# Patient Record
Sex: Male | Born: 1980 | Race: White | Hispanic: No | Marital: Single | Smoking: Current every day smoker
Health system: Southern US, Community
[De-identification: ages and names within clinical notes are randomized; demographics above are authoritative.]

## PROBLEM LIST (undated history)

## (undated) DIAGNOSIS — F4310 Post-traumatic stress disorder, unspecified: Secondary | ICD-10-CM

## (undated) DIAGNOSIS — K219 Gastro-esophageal reflux disease without esophagitis: Secondary | ICD-10-CM

## (undated) DIAGNOSIS — F1910 Other psychoactive substance abuse, uncomplicated: Secondary | ICD-10-CM

## (undated) DIAGNOSIS — D649 Anemia, unspecified: Secondary | ICD-10-CM

## (undated) DIAGNOSIS — J189 Pneumonia, unspecified organism: Secondary | ICD-10-CM

## (undated) DIAGNOSIS — T8859XA Other complications of anesthesia, initial encounter: Secondary | ICD-10-CM

## (undated) DIAGNOSIS — F1310 Sedative, hypnotic or anxiolytic abuse, uncomplicated: Secondary | ICD-10-CM

## (undated) DIAGNOSIS — Z8719 Personal history of other diseases of the digestive system: Secondary | ICD-10-CM

## (undated) DIAGNOSIS — R000 Tachycardia, unspecified: Secondary | ICD-10-CM

## (undated) DIAGNOSIS — R519 Headache, unspecified: Secondary | ICD-10-CM

## (undated) DIAGNOSIS — F1990 Other psychoactive substance use, unspecified, uncomplicated: Secondary | ICD-10-CM

## (undated) DIAGNOSIS — F329 Major depressive disorder, single episode, unspecified: Secondary | ICD-10-CM

## (undated) DIAGNOSIS — F419 Anxiety disorder, unspecified: Secondary | ICD-10-CM

## (undated) DIAGNOSIS — F909 Attention-deficit hyperactivity disorder, unspecified type: Secondary | ICD-10-CM

## (undated) DIAGNOSIS — F32A Depression, unspecified: Secondary | ICD-10-CM

## (undated) DIAGNOSIS — F39 Unspecified mood [affective] disorder: Secondary | ICD-10-CM

## (undated) HISTORY — PX: MULTIPLE TOOTH EXTRACTIONS: SHX2053

## (undated) HISTORY — PX: ADENOIDECTOMY: SUR15

## (undated) HISTORY — PX: WISDOM TOOTH EXTRACTION: SHX21

## (undated) HISTORY — PX: INNER EAR SURGERY: SHX679

---

## 2002-02-03 ENCOUNTER — Emergency Department (HOSPITAL_COMMUNITY): Admission: EM | Admit: 2002-02-03 | Discharge: 2002-02-03 | Payer: Self-pay | Admitting: Emergency Medicine

## 2002-02-03 ENCOUNTER — Encounter: Payer: Self-pay | Admitting: *Deleted

## 2002-02-04 ENCOUNTER — Encounter: Payer: Self-pay | Admitting: Emergency Medicine

## 2002-02-07 ENCOUNTER — Emergency Department (HOSPITAL_COMMUNITY): Admission: EM | Admit: 2002-02-07 | Discharge: 2002-02-07 | Payer: Self-pay

## 2002-02-08 ENCOUNTER — Emergency Department (HOSPITAL_COMMUNITY): Admission: EM | Admit: 2002-02-08 | Discharge: 2002-02-09 | Payer: Self-pay | Admitting: Emergency Medicine

## 2002-02-09 ENCOUNTER — Encounter: Admission: RE | Admit: 2002-02-09 | Discharge: 2002-02-09 | Payer: Self-pay

## 2003-12-30 ENCOUNTER — Emergency Department (HOSPITAL_COMMUNITY): Admission: EM | Admit: 2003-12-30 | Discharge: 2003-12-31 | Payer: Self-pay | Admitting: Emergency Medicine

## 2004-07-02 ENCOUNTER — Emergency Department (HOSPITAL_COMMUNITY): Admission: EM | Admit: 2004-07-02 | Discharge: 2004-07-02 | Payer: Self-pay | Admitting: Emergency Medicine

## 2004-08-04 ENCOUNTER — Ambulatory Visit: Payer: Self-pay | Admitting: Psychiatry

## 2004-08-04 ENCOUNTER — Inpatient Hospital Stay (HOSPITAL_COMMUNITY): Admission: RE | Admit: 2004-08-04 | Discharge: 2004-08-08 | Payer: Self-pay | Admitting: Psychiatry

## 2004-08-14 ENCOUNTER — Emergency Department (HOSPITAL_COMMUNITY): Admission: EM | Admit: 2004-08-14 | Discharge: 2004-08-14 | Payer: Self-pay | Admitting: Emergency Medicine

## 2005-08-03 ENCOUNTER — Inpatient Hospital Stay (HOSPITAL_COMMUNITY): Admission: RE | Admit: 2005-08-03 | Discharge: 2005-08-07 | Payer: Self-pay | Admitting: Psychiatry

## 2005-08-04 ENCOUNTER — Ambulatory Visit: Payer: Self-pay | Admitting: *Deleted

## 2006-06-13 ENCOUNTER — Ambulatory Visit: Payer: Self-pay | Admitting: Family Medicine

## 2006-11-26 ENCOUNTER — Emergency Department (HOSPITAL_COMMUNITY): Admission: EM | Admit: 2006-11-26 | Discharge: 2006-11-26 | Payer: Self-pay | Admitting: Emergency Medicine

## 2006-11-27 ENCOUNTER — Ambulatory Visit: Payer: Self-pay | Admitting: Internal Medicine

## 2006-11-27 ENCOUNTER — Inpatient Hospital Stay (HOSPITAL_COMMUNITY): Admission: EM | Admit: 2006-11-27 | Discharge: 2006-11-30 | Payer: Self-pay | Admitting: Emergency Medicine

## 2007-02-16 ENCOUNTER — Emergency Department (HOSPITAL_COMMUNITY): Admission: EM | Admit: 2007-02-16 | Discharge: 2007-02-16 | Payer: Self-pay | Admitting: Emergency Medicine

## 2007-02-18 ENCOUNTER — Emergency Department (HOSPITAL_COMMUNITY): Admission: EM | Admit: 2007-02-18 | Discharge: 2007-02-18 | Payer: Self-pay | Admitting: Emergency Medicine

## 2008-06-22 ENCOUNTER — Emergency Department (HOSPITAL_COMMUNITY): Admission: EM | Admit: 2008-06-22 | Discharge: 2008-06-22 | Payer: Self-pay | Admitting: Internal Medicine

## 2008-06-23 ENCOUNTER — Emergency Department (HOSPITAL_COMMUNITY): Admission: EM | Admit: 2008-06-23 | Discharge: 2008-06-23 | Payer: Self-pay | Admitting: Emergency Medicine

## 2008-06-23 ENCOUNTER — Inpatient Hospital Stay (HOSPITAL_COMMUNITY): Admission: RE | Admit: 2008-06-23 | Discharge: 2008-06-28 | Payer: Self-pay | Admitting: *Deleted

## 2008-06-23 ENCOUNTER — Ambulatory Visit: Payer: Self-pay | Admitting: Psychiatry

## 2008-06-26 ENCOUNTER — Ambulatory Visit: Admission: RE | Admit: 2008-06-26 | Discharge: 2008-06-26 | Payer: Self-pay | Admitting: Psychiatry

## 2008-07-06 ENCOUNTER — Emergency Department (HOSPITAL_COMMUNITY): Admission: EM | Admit: 2008-07-06 | Discharge: 2008-07-06 | Payer: Self-pay | Admitting: Emergency Medicine

## 2010-05-30 LAB — HEPATIC FUNCTION PANEL
ALT: 11 U/L (ref 0–53)
AST: 18 U/L (ref 0–37)
Indirect Bilirubin: 0.7 mg/dL (ref 0.3–0.9)
Total Bilirubin: 0.8 mg/dL (ref 0.3–1.2)
Total Protein: 7 g/dL (ref 6.0–8.3)

## 2010-05-30 LAB — CBC
HCT: 36.8 % — ABNORMAL LOW (ref 39.0–52.0)
Hemoglobin: 12.6 g/dL — ABNORMAL LOW (ref 13.0–17.0)
RBC: 4.04 MIL/uL — ABNORMAL LOW (ref 4.22–5.81)
RDW: 13.3 % (ref 11.5–15.5)
WBC: 6.9 10*3/uL (ref 4.0–10.5)

## 2010-05-30 LAB — BASIC METABOLIC PANEL
BUN: 8 mg/dL (ref 6–23)
Calcium: 8.6 mg/dL (ref 8.4–10.5)
Chloride: 107 mEq/L (ref 96–112)
Creatinine, Ser: 0.87 mg/dL (ref 0.4–1.5)
GFR calc Af Amer: >60 mL/min (ref >60)
Glucose, Bld: 109 mg/dL — ABNORMAL HIGH (ref 70–99)

## 2010-05-30 LAB — RAPID URINE DRUG SCREEN, HOSP PERFORMED
Amphetamines: NOT DETECTED
Barbiturates: NOT DETECTED
Cocaine: NOT DETECTED
Opiates: POSITIVE — AB
Tetrahydrocannabinol: POSITIVE — AB

## 2010-05-30 LAB — ETHANOL: Alcohol, Ethyl (B): <5 mg/dL (ref 0–10)

## 2010-05-30 LAB — DIFFERENTIAL
Neutro Abs: 4.4 10*3/uL (ref 1.7–7.7)
Neutrophils Relative %: 65 % (ref 43–77)

## 2010-07-04 NOTE — Consult Note (Signed)
[name], [name]             ACCOUNT NO.:  [id number]   MEDICAL RECORD NO.:  [id number]          PATIENT TYPE:  EMS   LOCATION:  ED                           FACILITY:  [facility]   PHYSICIAN:  Dionne Ano. Gramig, M.D.DATE OF BIRTH:  [birth year]   DATE OF CONSULTATION:  07/06/2008  DATE OF DISCHARGE:                                 CONSULTATION   CHIEF COMPLAINT/HISTORY OF PRESENT ILLNESS:  [name] is a 30-  year-old man who was seen in the emergency room.  On May 4, he  repeatedly the his hand with a hammer about the left upper extremity in  hopes to obtain narcotic pain medicine.  He was subsequently admitted to  [facility] and started on a program due to his multiple issues,  and is now fairly quiescent in terms of his mental status.  The patient  and I have reviewed this at length.  The patient currently is living  with his grandmother, whom helps take care of.  He is on medicines for  his psychiatric disturbances and polysubstance abuse.  He has a raised  area over the hand consistent with hematoma and I was asked to see and  treat this.  I was on call tonight and thus came to his bedside at 8:49  p.m.   PAST MEDICAL HISTORY:  Opioid addiction, panic attacks, anxiety  disorder, irritable bowel syndrome, depression.   SOCIAL HISTORY:  He does smoke cigarettes.  He is currently not taking  polysubstances that are addictive, but has just recently been clean  since his admission May 4 to [facility].   ALLERGIES:  COMPAZINE, PHENERGAN, NSAID, TRAMADOL, REGLAN.   PAST SURGICAL HISTORY:  Oral surgery and the ear surgery.   EXAMINATION:  CONSTITUTIONAL:  He is a 30 year old male, appropriate.  EXTREMITIES:  Noted to have a large hematoma over the dorsal aspect of  his hand.  No evidence of infection.  No erythema.  He is  neurovascularly intact.  EDC is intact.  There is no evidence of gross  disruption here.  The patient and I have reviewed this at  length.  There  is no obvious bony deformity and we have reviewed this as well.   IMPRESSION:  Hematoma left hand.   PLAN:  I have gone ahead and consented him verbally for evacuation of  the hematoma.  He was given a lidocaine block followed by evacuation of  the hematoma with an 18 gauge needle used as a piercing instrument.  I  then expressed the hematoma to my satisfaction, demonstrated a normal  neurologic examination, cleansed the wound once again and then gave him  a pressure dressing with normal refill and soft compartments, and no  complicating features.  We are going to place him on doxycycline 100 mg  one p.o. b.i.d. and see him back in the office in a week.  He will begin  dressing changes tomorrow.  Dressing supplies were dispensed through the  emergency room today and all questions, of course, have been encouraged  and answered.     Dionne Ano. Amanda Pea, M.D.  Electronically Signed  WMG/MEDQ  D:  07/06/2008  T:  07/06/2008  Job:  [id number]

## 2010-07-07 NOTE — Discharge Summary (Signed)
NAMEJASPER, [name]             ACCOUNT NO.:  [id number]   MEDICAL RECORD NO.:  [id number]          PATIENT TYPE:  IPS   LOCATION:  0504                          FACILITY:  [facility]   PHYSICIAN:  Geoffery Lyons, M.D.      DATE OF BIRTH:  1980/03/12   DATE OF ADMISSION:  08/04/2004  DATE OF DISCHARGE:  08/08/2004                                 DISCHARGE SUMMARY   CHIEF COMPLAINT/HISTORY OF PRESENT ILLNESS:  This was the first admission to  [facility] for this 30 year old single white male  voluntarily admitted.  He took himself off methadone 8 weeks prior to this  admission but has been taking his family's oxycodone.  Family is refusing to  give him any more.  He wants to help detoxing.  He was working with [facility] in [place] to get placement in long-term treatment  facility.   PAST PSYCHIATRIC HISTORY:  He has been a patient in [place] for 20 days in  2005.  He had been inpatient at [facility] in 2002.  Followed up at [facility] in [place].   ALCOHOL AND DRUG HISTORY:  He had been using marijuana and opiates since age  54.  Using benzodiazepines since age 43.  Using all these pretty much daily.   MEDICAL HISTORY:  Inflammatory bowel disease.   MEDICATIONS:  1.  Methadone 80 mg, but he took himself off this 8 weeks prior to this      admission.  2.  __________ 50 at night.   PHYSICAL EXAMINATION:  Performed and failed to show any acute findings.   LABORATORY WORKUP:  CBC within normal limits.  Blood chemistry within normal  limits.  Liver function studies within normal limits.  Drug screen positive  for benzodiazepine, opiates and marijuana.   MENTAL STATUS EXAM:  Revealed an alert, cooperative male, good eye contact.  Blunted affect.  Casual appearance.  Behavior was calm and cooperative.  Speech was clear, even pace and tone.  Mood was air exchange.  Thought  process were coherent without suicidal or homicidal ideation.  No  evidence  of psychosis or mania.  Cognition was well preserved.   ADMISSION DIAGNOSES:   AXIS I:  Polysubstance abuse.   AXIS II:  No diagnosis.   AXIS III:  Inflammatory bowel disease.   AXIS IV:  Moderate.   AXIS V:  Upon admission 36, highest global assessment of function in the  last year 65.   COURSE IN HOSPITAL:  He was admitted.  Started in individual and group  psychotherapy.  He was detoxified with Guanabenz.  He was given Ativan as  needed.  He endorsed anxiety.  He had been on Celexa, Lexapro, Wellbutrin,  Seroquel, Depakote, Effexor and Paxil and felt that nothing worked.  He  started using opiates for anxiety.  He appeared restless and tremulous.  Difficulty with sleep.  Endorsed he had problems with anxiety, that he has  not been officially diagnosed but he has ADHD.  Tendency to abuse pills.  He  has been detoxified other times, has been in  residential programs.  Problems  with SSRIs due to the difficulty handling pills.  We weaned him off the  benzodiazepine and tried Strattera.  He did not want to go to a long-term  treatment, would like to go to IOP at [facility].  He cannot say that he  would not go back to using opioids.  He is not motivated to pursue any other  treatment, any other medications.  On August 08, 2004, he was in full contact  with reality.  There were no suicidal ideas, no homicidal ideas, no  hallucinations, no delusions.  He wanted to pursue medications, not wanting  to go to outpatient treatment and not committed to long-term abstinence.   DISCHARGE DIAGNOSES:   AXIS I:  1.  Polysubstance abuse.  2.  Anxiety disorder, not otherwise specified.   AXIS II:  No diagnosis.   AXIS III:  No diagnosis.   AXIS IV:  Moderate.   AXIS V:  Upon discharge global assessment of function 50.   DISCHARGE MEDICATIONS:  Ambien 10 mg 2 at night for the next several days  with plans to wean off as he gets back home.   FOLLOW UP:  [facility].       IL/MEDQ  D:  08/29/2004  T:  08/30/2004  Job:  [id number]

## 2010-07-07 NOTE — Discharge Summary (Signed)
[name], [name]             ACCOUNT NO.:  [id number]   MEDICAL RECORD NO.:  [id number]          PATIENT TYPE:  IPS   LOCATION:  0506                          FACILITY:  [facility]   PHYSICIAN:  Geoffery Lyons, M.D.      DATE OF BIRTH:  27-Jul-1980   DATE OF ADMISSION:  06/23/2008  DATE OF DISCHARGE:  06/28/2008                               DISCHARGE SUMMARY   CHIEF COMPLAINT AND PRESENTING ILLNESS:  This was one of several  admissions to [facility] for this 30 year old male,  single, with history of opiate dependence.  Uses OxyContin up to 300 mg  per day, also smoked marijuana and takes Klonopin, was on methadone  before.  Has not been able to get his life back together.  Wanting to be  considered for Suboxone.  Upon admission, he was already withdrawing,  aches, pains, sweats, with a lot of cravings.  Had been through  clonidine detox before, but cannot take clonidine because lowers his  blood pressure too much.  As of recently, hit his hand with a hammer to  be able to get opioids.   PAST PSYCHIATRIC HISTORY:  Had been at [facility] 3 months in  2009, in [facility] 2005, as well as  [facility].  No significant  long-term sobriety.  He claims that he also been in [facility] and has been in  [facility] for outpatient follow-up.  Claims  he has been given Klonopin 2 mg 3 times a day and Effexor 150 mg daily.   ALCOHOL AND DRUG HISTORY:  As already stated, persistent use of opioids.   MEDICAL HISTORY:  Irritable bowel syndrome.   MEDICATION:  Bentyl 20 mg as needed, Zocor 20 mg per day.   PHYSICAL EXAMINATION:  Compatible with the bruised left hand.   LABORATORY WORKUP:  SGOT 18, SGPT 11, total bilirubin 0.8.   MENTAL STATUS EXAM:  Revealed alert cooperative male.  Mood anxious.  Affect anxious, depressed.  Thought processes logical, coherent and  relevant.  Overwhelmed with his inability to stay abstinent from  opioids.   Rather be dead than continue to live like this.  The last  event was hitting his hand in able to get opioids through the emergency  room.  No delusions.  No hallucinations.  Cognition well-preserved.   DIAGNOSES:  AXIS I:  Opiate dependence.  Opiate withdrawal, marijuana  abuse.  AXIS II:  No diagnosis.  AXIS III:  Status post self-induced injury to hand.  AXIS IV:  Moderate.  AXIS V:  Upon admission 35.  Highest GAF in the last year 60.   COURSE IN THE HOSPITAL:  Was admitted.  We again tried to use clonidine  for detox, but he was unable to take it.  Given the fact that he had  this long history of opioid dependency and no longtime significant  sobriety and that he is getting into more self-induced behavior to get  opioids and he was already withdrawing, we went ahead and started  Subutex.  We optimized the dose of Subutex.  He was  able to secure  outpatient follow-up where they were going to continue to maintain these  medications.  The family was willing to help him, as they felt that he  could not continue like this.  Given the fact that he was able to  tolerate the medication well and Subutex up to 8 mg twice a day, had  mostly taken care of the withdrawal and he was exhibiting no active  suicidal or homicidal ideas, we went ahead and discharged to outpatient  follow-up.   DISCHARGE DIAGNOSES:  AXIS I:  Opiate dependence, status post opiate  withdrawal.  Depressive disorder, anxiety disorder, not otherwise  specified.  Marijuana abuse.  AXIS II:  No diagnosis.  AXIS III:  Status post self-induced trauma to hand.  AXIS IV:  Moderate.  AXIS V:  Upon discharge 55-60.   Discharged on:  Zocor 20 mg per day, Effexor XR 150 mg per day, Suboxone  8 mg twice a day and Ambien 10 at bedtime for sleep short-term.  Follow-  up by Dr. Janyth Pupa and [facility] for the Suboxone.      Geoffery Lyons, M.D.  Electronically Signed     IL/MEDQ  D:  07/12/2008  T:  07/12/2008   Job:  [id number]

## 2010-07-07 NOTE — Discharge Summary (Signed)
NAMEJEMARIO, POITRAS             ACCOUNT NO.:  [id number]   MEDICAL RECORD NO.:  [id number]          PATIENT TYPE:  IPS   LOCATION:  0503                          FACILITY:  [facility]   PHYSICIAN:  Anselm Jungling, MD  DATE OF BIRTH:  Sep 02, 1980   DATE OF ADMISSION:  08/03/2005  DATE OF DISCHARGE:  08/07/2005                                 DISCHARGE SUMMARY   IDENTIFYING DATA AND REASON FOR ADMISSION:  The patient is a 30 year old  white male who was admitted for opiate detoxification.  He had been using  massive amounts of oxycodone both orally and intravenously.  He had  previously been admitted to our [facility] in the summer of  2006.  Please refer to the admission note for further details pertaining to  the symptoms, circumstances and history that led to his hospitalization.  He  was given an initial Axis I diagnosis of opiate dependence.   MEDICAL AND LABORATORY:  The patient came to [place] with a history of irritable  bowel syndrome.  He did not have any other active medical problems.  He was  not taking any medications at the time of his admission except for the above  referenced opiates.   The patient was medically and physically assessed by the psychiatric  physician's assistant at the time of admission.   HOSPITAL COURSE:  The patient was placed on a clonidine opiate withdrawal  protocol.  He presented as a well-nourished, normally developed male with  essentially normal mental status and sensorium.  He was calm and cooperative  throughout his inpatient stay.   He experienced significant opiate withdrawal symptoms, including nausea and  emesis.  In addition to clonidine, these were addressed with Zofran and  __________.  His initial treatment was monitored by Dr. Milford Cage.  The  undersigned assumed care of the patient on August 06, 2005, the fourth  hospital day.   By the fifth hospital day, the patient reported that he was feeling much  better and stated  that he thought he was past the worst of his withdrawal  symptoms.  He was sleeping well.  He requested discharge, indicating that he  needed to get back to his usual job.   The case manager discussed discharge with the patient's mother  who accepted  his discharge on that day.   AFTERCARE:  The patient was discharged with a plan to [facility] on August 08, 2005, for an intake  appointment with Gordy Clement.   DISCHARGE MEDICATIONS:  Clonidine 0.1 mg t.i.d. for 2 days, then 0.1 mg  b.i.d. for 2 days, then 0.1 mg q.h.s. for 1 week, then discontinue.   DISCHARGE DIAGNOSES:  AXIS I: Opiate dependence, early remission.  AXIS II: Deferred.  AXIS III: No acute or chronic illnesses.  AXIS IV: Stressors severe.  AXIS V: Global assessment of functioning on discharge 60.           ______________________________  Anselm Jungling, MD  Electronically Signed     SPB/MEDQ  D:  08/24/2005  T:  08/24/2005  Job:  [id number]

## 2010-07-07 NOTE — H&P (Signed)
NAMEJORDY, [name]             ACCOUNT NO.:  [id number]   MEDICAL RECORD NO.:  [id number]          PATIENT TYPE:  IPS   LOCATION:  0504                          FACILITY:  [facility]   PHYSICIAN:  [name], M.D.      DATE OF BIRTH:  Jan 12, 1981   DATE OF ADMISSION:  08/04/2004  DATE OF DISCHARGE:                         PSYCHIATRIC ADMISSION ASSESSMENT   IDENTIFYING INFORMATION:  The patient is a 30 year old single white male who  is voluntarily admitted to the [facility] on August 04, 2004.   HISTORY OF PRESENT ILLNESS:  The patient took himself off methadone 8 weeks  ago, but has been taking his family's oxycodone.  His family is now refusing  to give him any more medication, and he wants help in detoxing.  The patient  is working with [facility] facility in [place] to get  placement in a long-term treatment facility.  He denies any suicidal or  homicidal ideation, auditory or visual hallucinations or any mania.   PAST PSYCHIATRIC HISTORY:  He has been inpatient at [facility] for 20  days in 2005 for detoxification.  He has also been inpatient at [facility] in 2002.  He currently has outpatient following at [facility] in  [place].   SOCIAL HISTORY:  He is currently living with his mother in [place].  He is  unemployed.  He has applied for disability.  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  He describes his grandparents and his  mother as his social support system.  He denies any legal problems at this  time.   FAMILY HISTORY:  He states that he has an uncle who is an alcoholic.  His  mother has a history of substance abuse and is bipolar as well.  His father,  he believes, has some mental illness of unknown type.   ALCOHOL AND DRUG HISTORY:  He has been using marijuana and opiates since age  84 and began using benzodiazepine at age 46.  He has been using all of these  pretty much daily.   PRIMARY CARE  [name]:  He has no primary care [name].   MEDICAL PROBLEMS:  1.  IBD.  2.  Serotonin syndrome.   MEDICATIONS:  Most recently he was on  1.  Methadone 80 mg, but he took himself off 8 weeks ago.  2.  Vistaril 50 mg q.h.s. and t.i.d. p.r.n.   DRUG ALLERGIES:  He is allergic to NSAIDs.   PHYSICAL EXAMINATION:  His physical examination was performed at [facility]  emergency room.  Here at the [facility] his vital signs were  temperature 98, pulse 96, respirations 18, blood pressure 125/75.  In general he is 69 inches tall and 170 pounds.   LABORATORY DATA:  His CBC was within normal limits.  Blood chemistries were  within normal limits.  Liver function tests were within normal limits.  His  urine drug screen was positive for benzodiazepine, opiates and marijuana.  His TSH is pending.   MENTAL STATUS EXAM:  He is alert and  oriented x 4 with good eye contact and  a blunted affect.  His appearance is casual.  His behavior is calm and  cooperative.  His speech is clear with even pace and tone.  His mood was  anxious.  Thought process was coherent without suicidal ideation or  homicidal ideation.  No evidence of psychosis or mania was observed.  His  cognitive function, his concentration was normal.  His memory is intact.  His insight is fair.  His impulse control is fair.   ADMISSION DIAGNOSES:   AXIS I:  Opiate abuse, rule out dependence.   AXIS II:  Deferred.   AXIS III:  1.  IBD.  2.  Serotonin syndrome.   AXIS IV:  Moderate for occupational problems and other psychosocial problems  including substance abuse.   AXIS V:  Current global assessment of function is 36 with past year of 65.   PLAN:  Initial plan is to admit the patient voluntarily.  He is put on a  guanabenz detox protocol.  We will start Klonopin.  We will have a family  session for him, and we will work to increase his coping skills and decrease  his stress.  His follow up will be with [facility] in [place], and our  caseworker can work with [facility] regarding long-term treatment facility  placement.  Tentative length of stay 5-7 days.       AHW/MEDQ  D:  08/05/2004  T:  08/05/2004  Job:  [id number]
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
## 2010-11-24 LAB — DIFFERENTIAL
Basophils Absolute: 0
Lymphocytes Relative: 6 — ABNORMAL LOW
Lymphs Abs: 0.9
Monocytes Absolute: 0.6

## 2010-11-24 LAB — I-STAT 8, (EC8 V) (CONVERTED LAB)
Acid-Base Excess: 1
Bicarbonate: 25.8 — ABNORMAL HIGH
Chloride: 103
Glucose, Bld: 118 — ABNORMAL HIGH
HCT: 39
Potassium: 3.1 — ABNORMAL LOW
Sodium: 137
TCO2: 27
pCO2, Ven: 40.3 — ABNORMAL LOW
pH, Ven: 7.415 — ABNORMAL HIGH

## 2010-11-24 LAB — ETHANOL: Alcohol, Ethyl (B): <5

## 2010-11-24 LAB — COMPREHENSIVE METABOLIC PANEL
Albumin: 4.9
Alkaline Phosphatase: 73
BUN: 10
CO2: 28
Calcium: 9.1
Chloride: 103
Creatinine, Ser: 0.8
Glucose, Bld: 155 — ABNORMAL HIGH
Sodium: 140
Total Bilirubin: 0.4
Total Protein: 7.5

## 2010-11-24 LAB — CBC
HCT: 39.3
MCHC: 34.6
Platelets: 222
RBC: 4.05 — ABNORMAL LOW
WBC: 13.8 — ABNORMAL HIGH
WBC: 8.2

## 2010-11-24 LAB — POCT I-STAT CREATININE: Creatinine, Ser: 0.9

## 2010-11-24 LAB — RAPID URINE DRUG SCREEN, HOSP PERFORMED
Barbiturates: NOT DETECTED
Benzodiazepines: POSITIVE — AB
Tetrahydrocannabinol: POSITIVE — AB

## 2010-11-30 LAB — URINALYSIS, ROUTINE W REFLEX MICROSCOPIC
Bilirubin Urine: NEGATIVE
Nitrite: NEGATIVE
Protein, ur: NEGATIVE

## 2010-11-30 LAB — DIFFERENTIAL
Basophils Relative: 0
Basophils Relative: 0
Eosinophils Absolute: 0
Eosinophils Relative: 0
Eosinophils Relative: 0
Lymphocytes Relative: 22
Lymphocytes Relative: 35
Lymphs Abs: 1.3
Lymphs Abs: 2.2
Lymphs Abs: 2.8
Monocytes Relative: 10
Neutro Abs: 3.3
Neutrophils Relative %: 53
Neutrophils Relative %: 68

## 2010-11-30 LAB — BASIC METABOLIC PANEL
BUN: 2 — ABNORMAL LOW
BUN: 2 — ABNORMAL LOW
CO2: 26
Chloride: 105
Chloride: 99
Creatinine, Ser: 0.74
Creatinine, Ser: 0.74
GFR calc Af Amer: >60
GFR calc non Af Amer: >60
Glucose, Bld: 86
Glucose, Bld: 89
Potassium: 3.4 — ABNORMAL LOW

## 2010-11-30 LAB — COMPREHENSIVE METABOLIC PANEL
ALT: 11
ALT: 15
AST: 15
AST: 20
Alkaline Phosphatase: 75
CO2: 26
CO2: 29
CO2: 31
Calcium: 8.9
Calcium: 8.9
Calcium: 9.1
Chloride: 105
Chloride: 99
Creatinine, Ser: 0.75
GFR calc Af Amer: >60
GFR calc Af Amer: >60
GFR calc Af Amer: >60
GFR calc non Af Amer: >60
GFR calc non Af Amer: >60
GFR calc non Af Amer: >60
Glucose, Bld: 100 — ABNORMAL HIGH
Glucose, Bld: 101 — ABNORMAL HIGH
Potassium: 2.9 — ABNORMAL LOW
Potassium: 3.3 — ABNORMAL LOW
Sodium: 137
Sodium: 142
Total Bilirubin: 0.7
Total Bilirubin: 0.9

## 2010-11-30 LAB — CBC
HCT: 42
Hemoglobin: 11.8 — ABNORMAL LOW
Hemoglobin: 12.2 — ABNORMAL LOW
Hemoglobin: 14.2
MCHC: 33.9
MCHC: 34
MCHC: 34
MCV: 86.5
MCV: 86.6
Platelets: 167
RBC: 4.03 — ABNORMAL LOW
RBC: 4.1 — ABNORMAL LOW
RBC: 4.82
RDW: 12.4
RDW: 12.5
WBC: 12.5 — ABNORMAL HIGH
WBC: 5.8
WBC: 7.1

## 2010-11-30 LAB — I-STAT 8, (EC8 V) (CONVERTED LAB)
Hemoglobin: 15.3
Operator id: 270651
Potassium: 3.3 — ABNORMAL LOW

## 2010-11-30 LAB — RAPID URINE DRUG SCREEN, HOSP PERFORMED
Amphetamines: NOT DETECTED
Barbiturates: NOT DETECTED
Benzodiazepines: POSITIVE — AB
Opiates: POSITIVE — AB
Tetrahydrocannabinol: POSITIVE — AB

## 2010-11-30 LAB — LIPASE, BLOOD: Lipase: 17

## 2010-11-30 LAB — MAGNESIUM: Magnesium: 2.1

## 2012-05-03 ENCOUNTER — Encounter (HOSPITAL_COMMUNITY): Payer: Self-pay | Admitting: Emergency Medicine

## 2012-05-03 ENCOUNTER — Emergency Department (INDEPENDENT_AMBULATORY_CARE_PROVIDER_SITE_OTHER)
Admission: EM | Admit: 2012-05-03 | Discharge: 2012-05-03 | Disposition: A | Discharge status: Home / Self Care | Payer: Medicaid Other | Source: Home / Self Care | Attending: Emergency Medicine | Admitting: Emergency Medicine

## 2012-05-03 DIAGNOSIS — K047 Periapical abscess without sinus: Secondary | ICD-10-CM

## 2012-05-03 HISTORY — DX: Sedative, hypnotic or anxiolytic abuse, uncomplicated: F13.10

## 2012-05-03 MED ORDER — OXYCODONE-ACETAMINOPHEN 5-325 MG PO TABS: 1.0000 | ORAL_TABLET | ORAL | Status: DC | PRN

## 2012-05-03 MED ORDER — PENICILLIN V POTASSIUM 500 MG PO TABS: 500.0000 mg | ORAL_TABLET | Freq: Four times a day (QID) | ORAL | Status: AC

## 2012-05-03 NOTE — ED Notes (Signed)
Pt c/o dental pain/abscess x6 months His psychiatrist called earlier; does not want [place] to give him additional Benzos States he is a Recovering addict  Sx today include: nauseas, pain, swelling Denies: f/v/d  He is alert w/no signs of acute distress.

## 2012-05-03 NOTE — ED Provider Notes (Signed)
History     CSN: [id number]  Arrival date & time 05/03/12  1659   First MD Initiated Contact with Patient 05/03/12 1701      Chief Complaint  Patient presents with  . Dental Pain     Patient is a 32 y.o. male presenting with tooth pain. The history is provided by the patient.  Dental PainThe primary symptoms include mouth pain. Primary symptoms do not include fever. The symptoms began more than 1 month ago. The symptoms are worsening. The symptoms are recurrent.  Additional symptoms include: gum tenderness. Additional symptoms do not include: facial swelling, trouble swallowing, excessive salivation and drooling. Medical issues do not include: periodontal disease.  Pt reports having persistent problems with his teeth in general over the last 6 months. Pain to his (L) upper molar has increased over the last week and is worse over the last 2 days. Pt states he does plan to f/u w/ a dentist this week. Pt concerned because he is on Suboxone and thinks he is unable to take meds for pain. Pt states he thinks if he had something to help calm him down he could tolerate the pain better.   Past Medical History  Diagnosis Date  . Benzodiazepine abuse     History reviewed. No pertinent past surgical history.  No family history on file.  History  Substance Use Topics  . Smoking status: Current Every Day Smoker  . Smokeless tobacco: Not on file  . Alcohol Use: No      Review of Systems  Constitutional: Negative.  Negative for fever and chills.  HENT: Negative for facial swelling, drooling, trouble swallowing, neck stiffness and tinnitus.   Eyes: Negative.   Respiratory: Negative.   Gastrointestinal: Negative.   Endocrine: Negative.   Genitourinary: Negative.   Allergic/Immunologic: Negative.   Neurological: Negative.   Hematological: Negative.   Psychiatric/Behavioral: Negative.     Allergies  Compazine; Nsaids; Phenergan; and Tramadol  Home Medications   Current Outpatient  Rx  Name  Route  Sig  Dispense  Refill  . Buprenorphine HCl-Naloxone HCl (SUBOXONE SL)   Sublingual   Place under the tongue.         Marland Kitchen CLONAZEPAM PO   Oral   Take by mouth.         . oxyCODONE-acetaminophen (PERCOCET/ROXICET) 5-325 MG per tablet   Oral   Take 1 tablet by mouth every 4 (four) hours as needed for pain.   15 tablet   0   . penicillin v potassium (VEETID) 500 MG tablet   Oral   Take 1 tablet (500 mg total) by mouth 4 (four) times daily.   40 tablet   0     BP 120/78  Pulse 108  Temp(Src) 99.1 F (37.3 C) (Oral)  Resp 22  SpO2 95%  Physical Exam  Constitutional: He is oriented to person, place, and time. He appears well-developed and well-nourished.  HENT:  Head: Normocephalic and atraumatic.  Nose: Nose normal.  Mouth/Throat: Oropharynx is clear and moist and mucous membranes are normal. He does not have dentures. No oral lesions. Normal dentition. No edematous or dental caries.    Extensive decay in most teeth. Tooth in question (L) upper rear molar w/ significant decay. Some erythema to surrounding gingiva but no purulent drainage or obvious abscess.  Eyes: Conjunctivae are normal.  Neck: Neck supple.  Cardiovascular: Normal rate.   Pulmonary/Chest: Effort normal.  Musculoskeletal: Normal range of motion.  Neurological: He is alert and oriented  to person, place, and time.  Skin: Skin is warm and dry.  Psychiatric: He has a normal mood and affect.    ED Course  Procedures (including critical care time)  Labs Reviewed - No data to display No results found.   1. Tooth abscess       MDM  Pt w/ c/o severe tooth pain to (L) upper molar. Very poor dentition through-out. Possible abscess to (L) upper rear molar. Will treat w/ PCN and short course of Percocet. Dr Lorenz Coaster spoke w/ pt's "addiction specialist" who called ahead of pt's arrival and stated it was ok to give a short course of Percocet but not to give pt any Benzo's. I have checked w/  pharmacy who agrees there is no resaon pt can not stop his Suboxone for a couple of days to take pain then restart when tooth improving or addressed by a dentist. He did acknowledge that since pt did have Suboxone today the Percocet might not be as effective as it will eb tomorrow. I shared this information w/ pt who verbalizes understanding.         Leanne Chang, NP 05/05/12 2233

## 2012-05-06 NOTE — ED Provider Notes (Signed)
Medical screening examination/treatment/procedure(s) were performed by non-physician practitioner and as supervising physician I was immediately available for consultation/collaboration.  Leslee Home, M.D.  Reuben Likes, MD 05/06/12 [phone]

## 2012-10-26 ENCOUNTER — Emergency Department (HOSPITAL_COMMUNITY)
Admission: EM | Admit: 2012-10-26 | Discharge: 2012-10-26 | Disposition: A | Discharge status: Home / Self Care | Payer: Medicaid Other | Source: Home / Self Care

## 2012-10-26 ENCOUNTER — Encounter (HOSPITAL_COMMUNITY): Payer: Self-pay

## 2012-10-26 DIAGNOSIS — R1110 Vomiting, unspecified: Secondary | ICD-10-CM

## 2012-10-26 DIAGNOSIS — F1920 Other psychoactive substance dependence, uncomplicated: Secondary | ICD-10-CM

## 2012-10-26 DIAGNOSIS — F1121 Opioid dependence, in remission: Secondary | ICD-10-CM

## 2012-10-26 DIAGNOSIS — F1910 Other psychoactive substance abuse, uncomplicated: Secondary | ICD-10-CM

## 2012-10-26 HISTORY — DX: Anxiety disorder, unspecified: F41.9

## 2012-10-26 HISTORY — DX: Personal history of other diseases of the digestive system: Z87.19

## 2012-10-26 HISTORY — DX: Major depressive disorder, single episode, unspecified: F32.9

## 2012-10-26 HISTORY — DX: Depression, unspecified: F32.A

## 2012-10-26 MED ORDER — ONDANSETRON HCL 4 MG PO TABS: 4.0000 mg | ORAL_TABLET | Freq: Four times a day (QID) | ORAL | Status: DC

## 2012-10-26 NOTE — ED Provider Notes (Signed)
CSN: [id number]     Arrival date & time 10/26/12  1035 History   First MD Initiated Contact with Patient 10/26/12 1116     Chief Complaint  Patient presents with  . Emesis   (Consider location/radiation/quality/duration/timing/severity/associated sxs/prior Treatment) HPI Comments: 32 year old male is accompanied by his mother primarily here to get a note that he has been sick. His requirement was made by his substance abuse psychiatrist. He states he has episodic vomiting since his early childhood. He has been evaluated by gastroenterologist and had an endoscopies and evaluations without a specific diagnosis. [place] mention to him that he may have irritable bowel syndrome. History includes high-dose opioid addiction, high-dose benzodiazepine abuse, depression, anxiety and multiple hospital admissions for drug associated illness. He states that 3 days ago he began vomiting in the next appointment with a psychiatrist. He missed his appointment this week with a psychiatrist due to his vomiting. He is out of his Suboxone been having increased anxiety and abdominal pain as a result. According to the psychiatrist he continues to use doses of marijuana as well.   Past Medical History  Diagnosis Date  . Benzodiazepine abuse   . History of IBS   . Depression   . Anxiety    History reviewed. No pertinent past surgical history. History reviewed. No pertinent family history. History  Substance Use Topics  . Smoking status: Current Every Day Smoker  . Smokeless tobacco: Not on file  . Alcohol Use: No    Review of Systems  Constitutional: Positive for activity change. Negative for fever.  HENT: Negative.   Respiratory: Negative.   Cardiovascular: Negative.   Gastrointestinal: Positive for nausea, vomiting and abdominal pain. Negative for diarrhea, constipation and blood in stool.  Genitourinary: Negative.   Skin: Positive for pallor.  Neurological: Positive for tremors. Negative for seizures,  facial asymmetry and speech difficulty.  Psychiatric/Behavioral: Positive for sleep disturbance and dysphoric mood. The patient is nervous/anxious.     Allergies  Compazine; Nsaids; Phenergan; and Tramadol  Home Medications   Current Outpatient Rx  Name  Route  Sig  Dispense  Refill  . gabapentin (NEURONTIN) 100 MG capsule   Oral   Take 100 mg by mouth 3 (three) times daily.         . Buprenorphine HCl-Naloxone HCl (SUBOXONE SL)   Sublingual   Place under the tongue.         Marland Kitchen CLONAZEPAM PO   Oral   Take by mouth.         . ondansetron (ZOFRAN) 4 MG tablet   Oral   Take 1 tablet (4 mg total) by mouth every 6 (six) hours.   12 tablet   0   . oxyCODONE-acetaminophen (PERCOCET/ROXICET) 5-325 MG per tablet   Oral   Take 1 tablet by mouth every 4 (four) hours as needed for pain.   15 tablet   0    BP 122/71  Pulse 73  Temp(Src) 98.6 F (37 C) (Oral)  Resp 16  SpO2 98% Physical Exam  Nursing note and vitals reviewed. Constitutional: He is oriented to person, place, and time.  Thin and pale.  Eyes: EOM are normal.  Neck: Normal range of motion. Neck supple.  Cardiovascular: Normal rate and normal heart sounds.   Pulmonary/Chest: Effort normal and breath sounds normal. No respiratory distress. He has no wheezes.  Abdominal: Soft. There is tenderness. There is no rebound and no guarding.  Musculoskeletal: He exhibits no edema.  Neurological: He is alert and  oriented to person, place, and time.  Skin: Skin is warm and dry. Rash noted.  Psychiatric: His mood appears anxious. His speech is delayed. He is not withdrawn and not combative.    ED Course  Procedures (including critical care time) Labs Review Labs Reviewed - No data to display Imaging Review No results found.  MDM   1. Vomiting   2. Drug dependence   3. Polysubstance abuse   4. Opioid dependence in early, early partial, sustained full, or sustained partial remission       zofran as  directed. Start clear liquid diet and drink liquids in small frequent amounts for rehydration. Call your SUbstance abuse Doctor today. Patient states that his vomiting is his chronic and intermittent for several years. Has been evaluated by GI. He also has associated chronic intermittent abdominal pain today. There is no difference in his abdominal discomfort today as he has had. The psychiatrist states that the high diagnosis of marijuana use also causes abdominal pain.  St he is able to drink clear liquids at this time.  Dr. Rosalia Hammers  [phone]  Hayden Rasmussen, NP 10/26/12 [phone]

## 2012-10-26 NOTE — ED Notes (Signed)
Vomiting since 9-4, attempting PO fluid replacement ; pain ~8 on 1-10 scale; pale, NAD; was reportedly unable to make his substance abuse MD appt on Thursday, has not had his suboxone since Thursday, and per his MD, was directed to come here for proof of illness. Has pain in throat that is same as the pain he states he has as result of ruptured esophagus from vomiting 2007, made him stay in ICU x a few days  ; mother said "That is the most important thing, that's deadly! How could we forget that!!"

## 2012-10-26 NOTE — ED Provider Notes (Signed)
Medical screening examination/treatment/procedure(s) were performed by resident physician or non-physician practitioner and as supervising physician I was immediately available for consultation/collaboration.   KINDL,JAMES DOUGLAS MD.   James D Kindl, MD 10/26/12 1603 

## 2013-07-21 ENCOUNTER — Emergency Department (HOSPITAL_COMMUNITY): Payer: Medicaid Other

## 2013-07-21 ENCOUNTER — Encounter (HOSPITAL_COMMUNITY): Payer: Self-pay | Admitting: Emergency Medicine

## 2013-07-21 ENCOUNTER — Emergency Department (HOSPITAL_COMMUNITY)
Admission: EM | Admit: 2013-07-21 | Discharge: 2013-07-21 | Disposition: A | Discharge status: Home / Self Care | Payer: Medicaid Other | Attending: Emergency Medicine | Admitting: Emergency Medicine

## 2013-07-21 DIAGNOSIS — F1193 Opioid use, unspecified with withdrawal: Secondary | ICD-10-CM

## 2013-07-21 DIAGNOSIS — R109 Unspecified abdominal pain: Secondary | ICD-10-CM

## 2013-07-21 DIAGNOSIS — F17200 Nicotine dependence, unspecified, uncomplicated: Secondary | ICD-10-CM | POA: Insufficient documentation

## 2013-07-21 DIAGNOSIS — E860 Dehydration: Secondary | ICD-10-CM

## 2013-07-21 DIAGNOSIS — R110 Nausea: Secondary | ICD-10-CM

## 2013-07-21 DIAGNOSIS — Z79899 Other long term (current) drug therapy: Secondary | ICD-10-CM | POA: Insufficient documentation

## 2013-07-21 DIAGNOSIS — F19939 Other psychoactive substance use, unspecified with withdrawal, unspecified: Secondary | ICD-10-CM | POA: Insufficient documentation

## 2013-07-21 DIAGNOSIS — R61 Generalized hyperhidrosis: Secondary | ICD-10-CM | POA: Insufficient documentation

## 2013-07-21 DIAGNOSIS — F1123 Opioid dependence with withdrawal: Secondary | ICD-10-CM

## 2013-07-21 DIAGNOSIS — Z8719 Personal history of other diseases of the digestive system: Secondary | ICD-10-CM | POA: Insufficient documentation

## 2013-07-21 DIAGNOSIS — F1110 Opioid abuse, uncomplicated: Secondary | ICD-10-CM | POA: Insufficient documentation

## 2013-07-21 HISTORY — DX: Other psychoactive substance use, unspecified, uncomplicated: F19.90

## 2013-07-21 LAB — CBC WITH DIFFERENTIAL/PLATELET
Basophils Absolute: 0 10*3/uL (ref 0.0–0.1)
Basophils Relative: 0 % (ref 0–1)
EOS ABS: 0 10*3/uL (ref 0.0–0.7)
Eosinophils Relative: 0 % (ref 0–5)
HCT: 44 % (ref 39.0–52.0)
HEMOGLOBIN: 15 g/dL (ref 13.0–17.0)
LYMPHS PCT: 11 % — AB (ref 12–46)
Lymphs Abs: 1.1 10*3/uL (ref 0.7–4.0)
MCH: 29.9 pg (ref 26.0–34.0)
MCHC: 34.1 g/dL (ref 30.0–36.0)
MCV: 87.8 fL (ref 78.0–100.0)
Monocytes Absolute: 0.7 10*3/uL (ref 0.1–1.0)
Monocytes Relative: 7 % (ref 3–12)
NEUTROS PCT: 82 % — AB (ref 43–77)
Neutro Abs: 8.1 10*3/uL — ABNORMAL HIGH (ref 1.7–7.7)
PLATELETS: 294 10*3/uL (ref 150–400)
RBC: 5.01 MIL/uL (ref 4.22–5.81)
RDW: 12.8 % (ref 11.5–15.5)
WBC: 9.8 10*3/uL (ref 4.0–10.5)

## 2013-07-21 LAB — COMPREHENSIVE METABOLIC PANEL
ALT: 19 U/L (ref 0–53)
AST: 25 U/L (ref 0–37)
Albumin: 4.5 g/dL (ref 3.5–5.2)
Alkaline Phosphatase: 112 U/L (ref 39–117)
BILIRUBIN TOTAL: 0.5 mg/dL (ref 0.3–1.2)
BUN: 16 mg/dL (ref 6–23)
CO2: 23 mEq/L (ref 19–32)
Calcium: 10 mg/dL (ref 8.4–10.5)
Chloride: 95 mEq/L — ABNORMAL LOW (ref 96–112)
Creatinine, Ser: 0.74 mg/dL (ref 0.50–1.35)
GFR calc Af Amer: >90 mL/min (ref >90)
GFR calc non Af Amer: >90 mL/min (ref >90)
Glucose, Bld: 108 mg/dL — ABNORMAL HIGH (ref 70–99)
Potassium: 4.4 mEq/L (ref 3.7–5.3)
SODIUM: 137 meq/L (ref 137–147)
TOTAL PROTEIN: 8.4 g/dL — AB (ref 6.0–8.3)

## 2013-07-21 LAB — LIPASE, BLOOD: Lipase: 16 U/L (ref 11–59)

## 2013-07-21 LAB — CBG MONITORING, ED: GLUCOSE-CAPILLARY: 123 mg/dL — AB (ref 70–99)

## 2013-07-21 LAB — I-STAT CG4 LACTIC ACID, ED: Lactic Acid, Venous: 2.74 mmol/L — ABNORMAL HIGH (ref 0.5–2.2)

## 2013-07-21 MED ORDER — SODIUM CHLORIDE 0.9 % IV BOLUS (SEPSIS)
1000.0000 mL | Freq: Once | INTRAVENOUS | Status: AC
  Administered 2013-07-21: 1000 mL via INTRAVENOUS

## 2013-07-21 MED ORDER — CLONIDINE HCL 0.1 MG PO TABS
0.1000 mg | ORAL_TABLET | Freq: Once | ORAL | Status: AC
  Administered 2013-07-21: 0.1 mg via ORAL
  Filled 2013-07-21: qty 1

## 2013-07-21 MED ORDER — POLYETHYLENE GLYCOL 3350 17 GM/SCOOP PO POWD: ORAL | Status: DC

## 2013-07-21 MED ORDER — ONDANSETRON HCL 4 MG PO TABS: 4.0000 mg | ORAL_TABLET | Freq: Four times a day (QID) | ORAL | Status: DC

## 2013-07-21 MED ORDER — ONDANSETRON HCL 4 MG/2ML IJ SOLN
4.0000 mg | Freq: Once | INTRAMUSCULAR | Status: AC
  Administered 2013-07-21: 4 mg via INTRAVENOUS
  Filled 2013-07-21: qty 2

## 2013-07-21 NOTE — ED Provider Notes (Signed)
CSN: [id number]     Arrival date & time 07/21/13  1532 History   First MD Initiated Contact with Patient 07/21/13 1537     Chief Complaint  Patient presents with  . Nausea  . Emesis     (Consider location/radiation/quality/duration/timing/severity/associated sxs/prior Treatment) HPI  This is a 33 year old male with a history of substance abuse, IBS, and depression who presents with nausea, generalized pain, decreased by mouth intake for the last 2 days. Per the patient's mother, he has had generalized malaise, abdominal pain, generalized pain and nausea for last 2 days. No vomiting or diarrhea noted. However, because the patient is not tolerating food, he is unable to take his Suboxone.  The mother is concerned that he is in withdrawals. She is noted to be pale and diaphoretic. Patient himself reports "pain all over." At baseline his pain is usually a 3/10 but today it is 8/10.  He states the pain is all over. He confirms he has had intense nausea but no vomiting or diarrhea.  Past Medical History  Diagnosis Date  . Benzodiazepine abuse   . History of IBS   . Depression   . Anxiety   . Drug use    History reviewed. No pertinent past surgical history. No family history on file. History  Substance Use Topics  . Smoking status: Current Every Day Smoker  . Smokeless tobacco: Not on file  . Alcohol Use: No    Review of Systems  Constitutional: Positive for diaphoresis. Negative for fever.  Respiratory: Negative.  Negative for chest tightness and shortness of breath.   Cardiovascular: Negative.  Negative for chest pain.  Gastrointestinal: Positive for nausea and abdominal pain. Negative for vomiting and diarrhea.  Genitourinary: Negative.  Negative for dysuria.  Skin: Negative for rash.  Neurological: Negative for tremors, seizures and headaches.  Psychiatric/Behavioral: Negative for confusion.  All other systems reviewed and are negative.     Allergies  Compazine; Nsaids;  Phenergan; and Tramadol  Home Medications   Prior to Admission medications   Medication Sig Start Date End Date Taking? Authorizing Provider  buprenorphine-naloxone (SUBOXONE) 8-2 MG SUBL SL tablet Place 1 tablet under the tongue 3 (three) times daily.   Yes Historical Provider, MD  ondansetron (ZOFRAN) 4 MG tablet Take 1 tablet (4 mg total) by mouth every 6 (six) hours. 07/21/13   Shon Batonourtney F Coe Angelos, MD  polyethylene glycol powder (MIRALAX) powder Take 1 capful BID until stools are loose. 07/21/13   Shon Batonourtney F Jahdiel Krol, MD   BP 117/63  Pulse 103  Temp(Src) 98.4 F (36.9 C) (Oral)  Resp 12  SpO2 99% Physical Exam  Nursing note and vitals reviewed. Constitutional: He is oriented to person, place, and time.  Thin, no acute distress  HENT:  Head: Normocephalic and atraumatic.  Mucous membranes dry  Eyes: EOM are normal. Pupils are equal, round, and reactive to light.  Neck: Neck supple.  Cardiovascular: Normal rate, regular rhythm and normal heart sounds.   No murmur heard. Pulmonary/Chest: Effort normal and breath sounds normal. No respiratory distress. He has no wheezes.  Abdominal: Soft. Bowel sounds are normal. There is no tenderness. There is no rebound.  Scaphoid abdomen  Musculoskeletal: He exhibits no edema.  Lymphadenopathy:    He has no cervical adenopathy.  Neurological: He is alert and oriented to person, place, and time.  Skin: Skin is warm and dry.  Psychiatric:  Flat affect    ED Course  Procedures (including critical care time) Labs Review Labs Reviewed  CBC WITH DIFFERENTIAL - Abnormal; Notable for the following:    Neutrophils Relative % 82 (*)    Neutro Abs 8.1 (*)    Lymphocytes Relative 11 (*)    All other components within normal limits  COMPREHENSIVE METABOLIC PANEL - Abnormal; Notable for the following:    Chloride 95 (*)    Glucose, Bld 108 (*)    Total Protein 8.4 (*)    All other components within normal limits  CBG MONITORING, ED - Abnormal;  Notable for the following:    Glucose-Capillary 123 (*)    All other components within normal limits  I-STAT CG4 LACTIC ACID, ED - Abnormal; Notable for the following:    Lactic Acid, Venous 2.74 (*)    All other components within normal limits  LIPASE, BLOOD    Imaging Review Dg Abd 1 View  07/21/2013   CLINICAL DATA:  Nausea/ vomiting, lower abdominal pain  EXAM: ABDOMEN - 1 VIEW  COMPARISON:  CT abdomen pelvis dated 11/27/2006  FINDINGS: Nonobstructive bowel gas pattern.  Mild to moderate colonic stool burden.  Visualized osseous structures are within normal limits.  IMPRESSION: No evidence of bowel obstruction.  Mild to moderate colonic stool burden, at least raising the possibility of constipation.   Electronically Signed   By: Charline Bills M.D.   On: 07/21/2013 18:55     EKG Interpretation None      MDM   Final diagnoses:  Nausea  Abdominal pain  Narcotic withdrawal  Dehydration    Patient presents with abdominal pain and nausea. He is chronically ill-appearing. He reports that he's been unable to take his Suboxone and is concerned he is having withdrawal symptoms. Patient was given clonidine. Lab work notable for mild lactic acidosis with a lactate of 2.74. Patient does appear dehydrated. He was given fluids and pain and nausea medication.  No evidence of peritonitis on exam. KUB shows constipation. Otherwise workup is unremarkable. Patient was able to tolerate fluids prior to discharge. Patient will reinitiate Suboxone when he gets home. I have encouraged the patient to use MiraLAX for constipation. He will be given nausea medication as well.  After history, exam, and medical workup I feel the patient has been appropriately medically screened and is safe for discharge home. Pertinent diagnoses were discussed with the patient. Patient was given return precautions.     Shon Baton, MD 07/23/13 0157

## 2013-07-21 NOTE — ED Notes (Signed)
Informed Dr. Wilkie Aye of lactic acid of 2.74 mmol/L...Marland KitchenQA

## 2013-07-21 NOTE — Discharge Instructions (Signed)
Narcotic Withdrawal °When drug use interferes with normal living activities and relationships, it is abuse. Abuse includes problems with family and friends. Psychological dependence has developed when your mind tells you that the drug is needed. This is usually followed by a physical dependence in which you need more of the drug to get the same feeling or "high." This is known as addiction or chemical dependency. Risk is greater when chemical dependency exists in the family. °SYMPTOMS  °When tolerance to narcotics has developed, stopping of the narcotic suddenly can cause uncomfortable physical symptoms. Most of the time these are mild and consist of shakes or jitters (tremors) in the hands,a rapid heart rate, rapid breathing, and temperature. Sometimes these symptoms are associated with anxiety, panic attacks, and bad dreams. Other symptoms include: °· Irritability. °· Anxiety. °· Runny nose. °· "Goose flesh." °· Diarrhea. °· Feeling sick to the stomach (nauseous). °· Muscle spasms. °· Sleeplessness. °· Chills. °· Sweats. °· Drug cravings. °· Confusion. °The severity of the withdrawal is based on the individual and varies from person to person. Many people choose to continue using narcotics to get rid of the discomfort of withdrawal. They also use to try to feel normal. °TREATMENT  °Quitting an addiction means stopping use of all chemicals. This is hard but may save your life. With continual drug use, possible outcomes are often loss of self respect and esteem, violence, death, and eventually prison if the use of narcotics has led to the death of another. °Addiction cannot be cured, but it can be stopped. This often requires outside help and the care of professionals. Most hospitals and clinics can refer you to a specialized care center. °It is not necessary for you to go through the uncomfortable symptoms of withdrawal. Your caregiver can provide you with medications that will help you through this difficult  period. Try to avoid situations, friends, or alcohol, which may have made it possible for you to continue using narcotics in the past. Learn how to say no! °HOME CARE INSTRUCTIONS  °· Drink fluids, get plenty of rest, and take hot baths. °· Medicines may be prescribed to help control withdrawal symptoms. °· Over-the-counter medicines may be helpful to control diarrhea or an upset stomach. °· If your problems resulted from taking prescription pain medicines, make sure you have a follow-up visit with your caregiver within the next few days. Be open about this problem. °· If you are dependent or addicted to street drugs, contact a local drug and alcohol treatment center or Narcotics Anonymous. °· Have someone with you to monitor your symptoms. °· Engage in healthy activities with friends who do not use drugs. °· Stay away from the drug scene. °It takes a long period of time to overcome addictions to all drugs. There may be times when you feel as though you want to use. Following loss of a physical addiction and going through withdrawal, you have conquered the most difficult part of getting rid of an addiction. Gradually, you will have a lessening of the craving that is telling you that you need narcotics to feel normal. Call your caregiver or a member of your support group if more support is needed. Learn who to talk to in your family and among your friends so that during these periods you can receive outside help. °SEEK IMMEDIATE MEDICAL CARE IF:  °· You have vomiting that cannot be controlled, especially if you cannot keep liquids down. °· You are seeing things or hearing voices that are not really   there (hallucinating).  You have a seizure. Document Released: 04/28/2002 Document Revised: 04/30/2011 Document Reviewed: 02/01/2008 [facility] Patient Information 2014 [place], [place]. Abdominal Pain, Adult Many things can cause abdominal pain. Usually, abdominal pain is not caused by a disease and will improve  without treatment. It can often be observed and treated at home. Your health care provider will do a physical exam and possibly order blood tests and X-rays to help determine the seriousness of your pain. However, in many cases, more time must pass before a clear cause of the pain can be found. Before that point, your health care provider may not know if you need more testing or further treatment. HOME CARE INSTRUCTIONS  Monitor your abdominal pain for any changes. The following actions may help to alleviate any discomfort you are experiencing:  Only take over-the-counter or prescription medicines as directed by your health care provider.  Do not take laxatives unless directed to do so by your health care provider.  Try a clear liquid diet (broth, tea, or water) as directed by your health care provider. Slowly move to a bland diet as tolerated. SEEK MEDICAL CARE IF:  You have unexplained abdominal pain.  You have abdominal pain associated with nausea or diarrhea.  You have pain when you urinate or have a bowel movement.  You experience abdominal pain that wakes you in the night.  You have abdominal pain that is worsened or improved by eating food.  You have abdominal pain that is worsened with eating fatty foods. SEEK IMMEDIATE MEDICAL CARE IF:   Your pain does not go away within 2 hours.  You have a fever.  You keep throwing up (vomiting).  Your pain is felt only in portions of the abdomen, such as the right side or the left lower portion of the abdomen.  You pass bloody or black tarry stools. MAKE SURE YOU:  Understand these instructions.   Will watch your condition.   Will get help right away if you are not doing well or get worse.  Document Released: 11/15/2004 Document Revised: 11/26/2012 Document Reviewed: 10/15/2012 [facility] Patient Information 2014 [place], [place]. Dehydration, Adult Dehydration is when you lose more fluids from the body than you take in. Vital  organs like the kidneys, brain, and heart cannot function without a proper amount of fluids and salt. Any loss of fluids from the body can cause dehydration.  CAUSES   Vomiting.  Diarrhea.  Excessive sweating.  Excessive urine output.  Fever. SYMPTOMS  Mild dehydration  Thirst.  Dry lips.  Slightly dry mouth. Moderate dehydration  Very dry mouth.  Sunken eyes.  Skin does not bounce back quickly when lightly pinched and released.  Dark urine and decreased urine production.  Decreased tear production.  Headache. Severe dehydration  Very dry mouth.  Extreme thirst.  Rapid, weak pulse (more than 100 beats per minute at rest).  Cold hands and feet.  Not able to sweat in spite of heat and temperature.  Rapid breathing.  Blue lips.  Confusion and lethargy.  Difficulty being awakened.  Minimal urine production.  No tears. DIAGNOSIS  Your caregiver will diagnose dehydration based on your symptoms and your exam. Blood and urine tests will help confirm the diagnosis. The diagnostic evaluation should also identify the cause of dehydration. TREATMENT  Treatment of mild or moderate dehydration can often be done at home by increasing the amount of fluids that you drink. It is best to drink small amounts of fluid more often. Drinking too much  at one time can make vomiting worse. Refer to the home care instructions below. Severe dehydration needs to be treated at the hospital where you will probably be given intravenous (IV) fluids that contain water and electrolytes. HOME CARE INSTRUCTIONS   Ask your caregiver about specific rehydration instructions.  Drink enough fluids to keep your urine clear or pale yellow.  Drink small amounts frequently if you have nausea and vomiting.  Eat as you normally do.  Avoid:  Foods or drinks high in sugar.  Carbonated drinks.  Juice.  Extremely hot or cold fluids.  Drinks with caffeine.  Fatty, greasy  foods.  Alcohol.  Tobacco.  Overeating.  Gelatin desserts.  Wash your hands well to avoid spreading bacteria and viruses.  Only take over-the-counter or prescription medicines for pain, discomfort, or fever as directed by your caregiver.  Ask your caregiver if you should continue all prescribed and over-the-counter medicines.  Keep all follow-up appointments with your caregiver. SEEK MEDICAL CARE IF:  You have abdominal pain and it increases or stays in one area (localizes).  You have a rash, stiff neck, or severe headache.  You are irritable, sleepy, or difficult to awaken.  You are weak, dizzy, or extremely thirsty. SEEK IMMEDIATE MEDICAL CARE IF:   You are unable to keep fluids down or you get worse despite treatment.  You have frequent episodes of vomiting or diarrhea.  You have blood or green matter (bile) in your vomit.  You have blood in your stool or your stool looks black and tarry.  You have not urinated in 6 to 8 hours, or you have only urinated a small amount of very dark urine.  You have a fever.  You faint. MAKE SURE YOU:   Understand these instructions.  Will watch your condition.  Will get help right away if you are not doing well or get worse. Document Released: 02/05/2005 Document Revised: 04/30/2011 Document Reviewed: 09/25/2010 [facility] Patient Information 2014 [place], [place].

## 2013-07-21 NOTE — ED Notes (Addendum)
Per patient- mom called out for seizure. On EMS arrival diaphoretic, temporal fever 96.9. C/o gen pain. N,V x2 days. Hasn't been able to eat or drink since Sunday. Supposed to take Suboxone. 20 G PIV on right FA. Gave 4 mg Zofran. A&O x4. VS: BP 109/76 HR 62-95 RR 16 CBG 117.

## 2013-07-21 NOTE — ED Notes (Signed)
Bed: WA01 Expected date:  Expected time:  Means of arrival:  Comments: EMS vomiting 

## 2013-07-25 ENCOUNTER — Emergency Department (HOSPITAL_COMMUNITY)
Admission: EM | Admit: 2013-07-25 | Discharge: 2013-07-26 | Disposition: A | Discharge status: Home / Self Care | Payer: Medicaid Other | Attending: Emergency Medicine | Admitting: Emergency Medicine

## 2013-07-25 ENCOUNTER — Emergency Department (HOSPITAL_COMMUNITY): Payer: Medicaid Other

## 2013-07-25 ENCOUNTER — Encounter (HOSPITAL_COMMUNITY): Payer: Self-pay | Admitting: Emergency Medicine

## 2013-07-25 DIAGNOSIS — Z79899 Other long term (current) drug therapy: Secondary | ICD-10-CM | POA: Insufficient documentation

## 2013-07-25 DIAGNOSIS — K5900 Constipation, unspecified: Secondary | ICD-10-CM

## 2013-07-25 DIAGNOSIS — R110 Nausea: Secondary | ICD-10-CM | POA: Insufficient documentation

## 2013-07-25 DIAGNOSIS — F17200 Nicotine dependence, unspecified, uncomplicated: Secondary | ICD-10-CM | POA: Insufficient documentation

## 2013-07-25 DIAGNOSIS — Z8659 Personal history of other mental and behavioral disorders: Secondary | ICD-10-CM | POA: Insufficient documentation

## 2013-07-25 LAB — URINALYSIS, ROUTINE W REFLEX MICROSCOPIC
Bilirubin Urine: NEGATIVE
Glucose, UA: NEGATIVE mg/dL
Hgb urine dipstick: NEGATIVE
Ketones, ur: NEGATIVE mg/dL
LEUKOCYTES UA: NEGATIVE
NITRITE: NEGATIVE
Protein, ur: NEGATIVE mg/dL
SPECIFIC GRAVITY, URINE: 1.024 (ref 1.005–1.030)
UROBILINOGEN UA: 1 mg/dL (ref 0.0–1.0)
pH: 6 (ref 5.0–8.0)

## 2013-07-25 LAB — CBC WITH DIFFERENTIAL/PLATELET
Basophils Absolute: 0 10*3/uL (ref 0.0–0.1)
Basophils Relative: 0 % (ref 0–1)
EOS ABS: 0 10*3/uL (ref 0.0–0.7)
EOS PCT: 0 % (ref 0–5)
HCT: 42.1 % (ref 39.0–52.0)
HEMOGLOBIN: 14.7 g/dL (ref 13.0–17.0)
LYMPHS ABS: 1.8 10*3/uL (ref 0.7–4.0)
Lymphocytes Relative: 21 % (ref 12–46)
MCH: 30.6 pg (ref 26.0–34.0)
MCHC: 34.9 g/dL (ref 30.0–36.0)
MCV: 87.5 fL (ref 78.0–100.0)
MONOS PCT: 7 % (ref 3–12)
Monocytes Absolute: 0.6 10*3/uL (ref 0.1–1.0)
Neutro Abs: 6.2 10*3/uL (ref 1.7–7.7)
Neutrophils Relative %: 72 % (ref 43–77)
Platelets: 265 10*3/uL (ref 150–400)
RBC: 4.81 MIL/uL (ref 4.22–5.81)
RDW: 12.7 % (ref 11.5–15.5)
WBC: 8.7 10*3/uL (ref 4.0–10.5)

## 2013-07-25 LAB — COMPREHENSIVE METABOLIC PANEL
ALT: 25 U/L (ref 0–53)
AST: 29 U/L (ref 0–37)
Albumin: 4.6 g/dL (ref 3.5–5.2)
Alkaline Phosphatase: 97 U/L (ref 39–117)
BUN: 12 mg/dL (ref 6–23)
CO2: 28 mEq/L (ref 19–32)
CREATININE: 0.69 mg/dL (ref 0.50–1.35)
Calcium: 9.8 mg/dL (ref 8.4–10.5)
Chloride: 97 mEq/L (ref 96–112)
GFR calc non Af Amer: >90 mL/min (ref >90)
GLUCOSE: 99 mg/dL (ref 70–99)
Potassium: 3.6 mEq/L — ABNORMAL LOW (ref 3.7–5.3)
Sodium: 140 mEq/L (ref 137–147)
TOTAL PROTEIN: 8.2 g/dL (ref 6.0–8.3)
Total Bilirubin: 0.5 mg/dL (ref 0.3–1.2)

## 2013-07-25 LAB — LIPASE, BLOOD: LIPASE: 24 U/L (ref 11–59)

## 2013-07-25 NOTE — ED Provider Notes (Signed)
CSN: [id number]     Arrival date & time 07/25/13  2033 History   First MD Initiated Contact with Patient 07/25/13 2048     Chief Complaint  Patient presents with  . Abdominal Pain  . Constipation     (Consider location/radiation/quality/duration/timing/severity/associated sxs/prior Treatment) Patient is a 33 y.o. male presenting with abdominal pain.  Abdominal Pain Pain location:  Generalized Pain quality: bloating   Pain quality comment:  "discomfort" Pain radiates to:  Does not radiate Pain severity:  Severe Onset quality:  Gradual Duration: a few days. Timing:  Constant Progression:  Worsening Chronicity:  New Relieved by:  Nothing Ineffective treatments: miralax and a fleet enema. Associated symptoms: constipation and nausea   Associated symptoms: no dysuria, no fever and no vomiting     Past Medical History  Diagnosis Date  . Benzodiazepine abuse   . History of IBS   . Depression   . Anxiety   . Drug use    History reviewed. No pertinent past surgical history. No family history on file. History  Substance Use Topics  . Smoking status: Current Every Day Smoker  . Smokeless tobacco: Not on file  . Alcohol Use: No    Review of Systems  Constitutional: Negative for fever.  Gastrointestinal: Positive for nausea, abdominal pain and constipation. Negative for vomiting.  Genitourinary: Negative for dysuria.  All other systems reviewed and are negative.     Allergies  Compazine; Nsaids; Phenergan; and Tramadol  Home Medications   Prior to Admission medications   Medication Sig Start Date End Date Taking? Authorizing Provider  buprenorphine-naloxone (SUBOXONE) 8-2 MG SUBL SL tablet Place 1 tablet under the tongue 3 (three) times daily.   Yes Historical Provider, MD  ondansetron (ZOFRAN) 4 MG tablet Take 1 tablet (4 mg total) by mouth every 6 (six) hours. 07/21/13  Yes Shon Baton, MD  polyethylene glycol powder (MIRALAX) powder Take 1 capful BID until  stools are loose. 07/21/13  Yes Shon Baton, MD  sodium phosphate Pediatric (FLEET) 3.5-9.5 GM/59ML enema Place 1 enema rectally once.   Yes Historical Provider, MD   BP 123/82  Pulse 97  Temp(Src) 98.7 F (37.1 C) (Oral)  Resp 14  SpO2 100% Physical Exam  Nursing note and vitals reviewed. Constitutional: He is oriented to person, place, and time. He appears well-developed and well-nourished. No distress.  HENT:  Head: Normocephalic and atraumatic.  Mouth/Throat: Oropharynx is clear and moist.  Eyes: Conjunctivae are normal. Pupils are equal, round, and reactive to light. No scleral icterus.  Neck: Neck supple.  Cardiovascular: Normal rate, regular rhythm, normal heart sounds and intact distal pulses.   No murmur heard. Pulmonary/Chest: Effort normal and breath sounds normal. No stridor. No respiratory distress. He has no wheezes. He has no rales.  Abdominal: Soft. He exhibits no distension. There is generalized tenderness (mild). There is no rigidity, no rebound and no guarding. Hernia confirmed negative in the right inguinal area and confirmed negative in the left inguinal area.  Genitourinary: Penis normal. Rectal exam shows no mass (no rectal fecal impaction) and no tenderness. Right testis shows no mass, no swelling and no tenderness. Left testis shows no mass, no swelling and no tenderness. Circumcised. No discharge found.  Musculoskeletal: Normal range of motion. He exhibits no edema.  Neurological: He is alert and oriented to person, place, and time.  Skin: Skin is warm and dry. No rash noted.  Psychiatric: He has a normal mood and affect. His behavior is normal.  ED Course  Procedures (including critical care time) Labs Review Labs Reviewed  COMPREHENSIVE METABOLIC PANEL - Abnormal; Notable for the following:    Potassium 3.6 (*)    All other components within normal limits  URINALYSIS, ROUTINE W REFLEX MICROSCOPIC - Abnormal; Notable for the following:    Color,  Urine AMBER (*)    APPearance CLOUDY (*)    All other components within normal limits  CBC WITH DIFFERENTIAL  LIPASE, BLOOD    Imaging Review Dg Abd Acute W/chest  07/25/2013   CLINICAL DATA:  Abdominal pain and constipation  EXAM: ACUTE ABDOMEN SERIES (ABDOMEN 2 VIEW & CHEST 1 VIEW)  COMPARISON:  Abdomen radiograph July 21, 2013 ; chest radiograph May 27, 2008  FINDINGS: PA chest: No edema or consolidation. Heart size and pulmonary vascularity are normal. No adenopathy.  Supine and upright abdomen: There is diffuse stool throughout colon. The bowel gas pattern is unremarkable. No obstruction or free air. There are phleboliths in the pelvis.  IMPRESSION: Fairly extensive stool throughout colon. Bowel gas pattern overall unremarkable. No edema or consolidation.   Electronically Signed   By: Bretta BangWilliam  Woodruff M.D.   On: 07/25/2013 21:45  All radiology studies independently viewed by me.      EKG Interpretation None      MDM   Final diagnoses:  Constipation    33 yo male with chief complaint of constipation.  States he has tried miralax at home today without success.  He has some abdominal discomfort and bloating, but denies outright pain.  He has been passing some flatus. His abdominal exam is reassuring.  His plain abdominal films were consistent with constipation.  He had no rectal fecal impaction on DRE.  He was advised to continue supportive treatment for constipation.  He was given strict return precautions including abdominal pain, fevers, vomiting, etc and he was advised PCP follow up.      Candyce ChurnJohn David Sandrika Schwinn III, MD 07/26/13 [phone]

## 2013-07-25 NOTE — ED Notes (Signed)
Pt presents with c/o lower abdominal pain and constipation. Pt says the last time he had a bowel movement was approx 11 days ago, pt was seen last week for vomiting. Pt says the pain is in his lower abdomen at this time. Pt says he has been nauseated but no vomiting at this time.

## 2013-07-25 NOTE — Discharge Instructions (Signed)

## 2013-07-25 NOTE — ED Notes (Signed)
A urinal has been placed at the bedside for the Pt to use.

## 2013-07-27 ENCOUNTER — Encounter (HOSPITAL_COMMUNITY): Payer: Self-pay | Admitting: Emergency Medicine

## 2013-07-27 ENCOUNTER — Emergency Department (HOSPITAL_COMMUNITY)
Admission: EM | Admit: 2013-07-27 | Discharge: 2013-07-28 | Disposition: A | Discharge status: Home / Self Care | Payer: Medicaid Other | Attending: Emergency Medicine | Admitting: Emergency Medicine

## 2013-07-27 ENCOUNTER — Emergency Department (HOSPITAL_COMMUNITY): Payer: Medicaid Other

## 2013-07-27 DIAGNOSIS — R109 Unspecified abdominal pain: Secondary | ICD-10-CM

## 2013-07-27 DIAGNOSIS — K5900 Constipation, unspecified: Secondary | ICD-10-CM

## 2013-07-27 DIAGNOSIS — E860 Dehydration: Secondary | ICD-10-CM | POA: Insufficient documentation

## 2013-07-27 DIAGNOSIS — R110 Nausea: Secondary | ICD-10-CM | POA: Insufficient documentation

## 2013-07-27 DIAGNOSIS — F17200 Nicotine dependence, unspecified, uncomplicated: Secondary | ICD-10-CM | POA: Insufficient documentation

## 2013-07-27 DIAGNOSIS — Z8659 Personal history of other mental and behavioral disorders: Secondary | ICD-10-CM | POA: Insufficient documentation

## 2013-07-27 DIAGNOSIS — R630 Anorexia: Secondary | ICD-10-CM | POA: Insufficient documentation

## 2013-07-27 DIAGNOSIS — Z79899 Other long term (current) drug therapy: Secondary | ICD-10-CM | POA: Insufficient documentation

## 2013-07-27 MED ORDER — SODIUM CHLORIDE 0.9 % IV BOLUS (SEPSIS)
1000.0000 mL | Freq: Once | INTRAVENOUS | Status: AC
  Administered 2013-07-27: 1000 mL via INTRAVENOUS

## 2013-07-27 MED ORDER — ONDANSETRON HCL 4 MG/2ML IJ SOLN
4.0000 mg | Freq: Once | INTRAMUSCULAR | Status: AC
  Administered 2013-07-27: 4 mg via INTRAVENOUS
  Filled 2013-07-27: qty 2

## 2013-07-27 MED ORDER — MAGNESIUM HYDROXIDE 400 MG/5ML PO SUSP
30.0000 mL | Freq: Once | ORAL | Status: AC
  Administered 2013-07-27: 30 mL via ORAL
  Filled 2013-07-27: qty 30

## 2013-07-27 MED ORDER — POLYETHYLENE GLYCOL 3350 17 GM/SCOOP PO POWD
1.0000 | Freq: Once | ORAL | Status: AC
  Administered 2013-07-28: 1 via ORAL
  Filled 2013-07-27: qty 255

## 2013-07-27 NOTE — ED Notes (Signed)
Patient transported to X-ray 

## 2013-07-27 NOTE — ED Notes (Addendum)
Per patient-A&O x4. Has been there x3 since last Monday. Has been taking miralax and colace. Has had hx constipation. Tried to give himself enema x3 days ago. Also tried glycerin suppository. Feels dehydrated and hasn't been eating or drinking like normal. Reports he "has struggled with constipation since adolescence but has never dealt with it like this." Takes Suboxone at home. No other issues at this time. C/o 8/10 pain in abdomen. C/o cold sweats and dry heaving.  In NAD. Awaiting MD.

## 2013-07-27 NOTE — ED Notes (Signed)
Patient is alert and oriented x3.  He is complaining of constipation for over a week. He states that he has been drinking miralax with only watery results.  Currently  He rates his pain in the lower abdominal pain area 8 of 10 with nausea.

## 2013-07-28 MED ORDER — MAGNESIUM HYDROXIDE 400 MG/5ML PO SUSP: 60.0000 mL | Freq: Every day | ORAL | Status: DC | PRN

## 2013-07-28 MED ORDER — FLEET ENEMA 7-19 GM/118ML RE ENEM
1.0000 | ENEMA | Freq: Once | RECTAL | Status: AC
  Administered 2013-07-28: 1 via RECTAL
  Filled 2013-07-28: qty 1

## 2013-07-28 NOTE — Discharge Instructions (Signed)
Take tylenol every 4 hours as needed (15 mg per kg) and take motrin (ibuprofen) every 6 hours as needed for fever or pain (10 mg per kg). Return for any changes, weird rashes, neck stiffness, change in behavior, new or worsening concerns.  Follow up with your physician as directed. Thank you Filed Vitals:   07/27/13 2245  BP: 125/82  Pulse: 79  Temp: 98.3 F (36.8 C)  TempSrc: Oral  Resp: 16  SpO2: 100%

## 2013-08-06 NOTE — ED Provider Notes (Signed)
CSN: [id number]     Arrival date & time 07/27/13  2001 History   First MD Initiated Contact with Patient 07/27/13 2154     Chief Complaint  Patient presents with  . Constipation     (Consider location/radiation/quality/duration/timing/severity/associated sxs/prior Treatment) HPI Comments: 33 year old male with significant history of benzo and narcotic abuse, constipation, smoking, on daily MiraLAX and Colace with enema use 3 days prior, Suboxone daily presents with abdominal cramping and constipation similar to previous but more severe. Patient has had this multiple times likely due to narcotic abuse and narcotics history. Patient denies fevers chills or significant vomiting. No bowel obstruction history. Nothing is improved his symptoms these had only small liquid stool output.  Patient is a 33 y.o. male presenting with constipation. The history is provided by the patient.  Constipation Associated symptoms: abdominal pain and nausea   Associated symptoms: no back pain, no dysuria, no fever and no vomiting     Past Medical History  Diagnosis Date  . Benzodiazepine abuse   . History of IBS   . Depression   . Anxiety   . Drug use    History reviewed. No pertinent past surgical history. History reviewed. No pertinent family history. History  Substance Use Topics  . Smoking status: Current Every Day Smoker  . Smokeless tobacco: Not on file  . Alcohol Use: No    Review of Systems  Constitutional: Positive for appetite change. Negative for fever and chills.  Cardiovascular: Negative for chest pain.  Gastrointestinal: Positive for nausea, abdominal pain and constipation. Negative for vomiting.  Genitourinary: Negative for dysuria and flank pain.  Musculoskeletal: Negative for back pain, neck pain and neck stiffness.  Skin: Negative for rash.  Neurological: Negative for light-headedness and headaches.      Allergies  Compazine; Nsaids; Phenergan; and Tramadol  Home  Medications   Prior to Admission medications   Medication Sig Start Date End Date Taking? Authorizing Provider  buprenorphine-naloxone (SUBOXONE) 8-2 MG SUBL SL tablet Place 1 tablet under the tongue 3 (three) times daily.   Yes Historical Provider, MD  ondansetron (ZOFRAN) 4 MG tablet Take 1 tablet (4 mg total) by mouth every 6 (six) hours. 07/21/13  Yes Shon Batonourtney F Horton, MD  polyethylene glycol powder (MIRALAX) powder Take 1 capful BID until stools are loose. 07/21/13  Yes Shon Batonourtney F Horton, MD  sodium phosphate Pediatric (FLEET) 3.5-9.5 GM/59ML enema Place 1 enema rectally once.   Yes Historical Provider, MD  magnesium hydroxide (MILK OF MAGNESIA) 400 MG/5ML suspension Take 60 mLs by mouth daily as needed for mild constipation or moderate constipation. 07/28/13   Enid SkeensJoshua M Zavitz, MD   BP 124/80  Pulse 61  Temp(Src) 98.3 F (36.8 C) (Oral)  Resp 18  SpO2 97% Physical Exam  Nursing note and vitals reviewed. Constitutional: He is oriented to person, place, and time. He appears well-developed and well-nourished.  HENT:  Head: Normocephalic and atraumatic.  Mild dry mm  Eyes: Conjunctivae are normal. Right eye exhibits no discharge. Left eye exhibits no discharge.  Neck: Normal range of motion. Neck supple. No tracheal deviation present.  Cardiovascular: Normal rate and regular rhythm.   Pulmonary/Chest: Effort normal and breath sounds normal.  Abdominal: Soft. Bowel sounds are normal. He exhibits no distension. There is tenderness (mild central). There is no guarding.  Musculoskeletal: He exhibits no edema.  Neurological: He is alert and oriented to person, place, and time.  Skin: Skin is warm. No rash noted.  Psychiatric: He has a normal  mood and affect.    ED Course  Procedures (including critical care time) Labs Review Labs Reviewed - No data to display  Imaging Review No results found. Dg Abd 1 View  07/21/2013   CLINICAL DATA:  Nausea/ vomiting, lower abdominal pain  EXAM:  ABDOMEN - 1 VIEW  COMPARISON:  CT abdomen pelvis dated 11/27/2006  FINDINGS: Nonobstructive bowel gas pattern.  Mild to moderate colonic stool burden.  Visualized osseous structures are within normal limits.  IMPRESSION: No evidence of bowel obstruction.  Mild to moderate colonic stool burden, at least raising the possibility of constipation.   Electronically Signed   By: Charline BillsSriyesh  Krishnan M.D.   On: 07/21/2013 18:55   Dg Abd 2 Views  07/28/2013   CLINICAL DATA:  Constipation and mid abdominal pain for 1 week.  EXAM: ABDOMEN - 2 VIEW  COMPARISON:  07/25/2013  FINDINGS: The bowel gas pattern is normal. There is no evidence of free air. No radio-opaque calculi or other significant radiographic abnormality is seen.  IMPRESSION: Negative.   Electronically Signed   By: Burman NievesWilliam  Stevens M.D.   On: 07/28/2013 00:03   Dg Abd Acute W/chest  07/25/2013   CLINICAL DATA:  Abdominal pain and constipation  EXAM: ACUTE ABDOMEN SERIES (ABDOMEN 2 VIEW & CHEST 1 VIEW)  COMPARISON:  Abdomen radiograph July 21, 2013 ; chest radiograph May 27, 2008  FINDINGS: PA chest: No edema or consolidation. Heart size and pulmonary vascularity are normal. No adenopathy.  Supine and upright abdomen: There is diffuse stool throughout colon. The bowel gas pattern is unremarkable. No obstruction or free air. There are phleboliths in the pelvis.  IMPRESSION: Fairly extensive stool throughout colon. Bowel gas pattern overall unremarkable. No edema or consolidation.   Electronically Signed   By: Bretta BangWilliam  Woodruff M.D.   On: 07/25/2013 21:45    EKG Interpretation None      MDM   Final diagnoses:  Dehydration  Abdominal cramping  Constipation   Overall well-appearing male with clinically constipation similar to previous. IV fluids and nausea meds given for nausea and decreased oral input recently. MiraLAX and milk of magnesia given. Discussed followup outpatient. I do not feel pt has bowel obstruction. Results and differential diagnosis  were discussed with the patient/parent/guardian. Close follow up outpatient was discussed, comfortable with the plan.   Medications  magnesium hydroxide (MILK OF MAGNESIA) suspension 30 mL (30 mLs Oral Given 07/27/13 2234)  polyethylene glycol powder (GLYCOLAX/MIRALAX) container 1 Container (1 Container Oral Given 07/28/13 0019)  sodium chloride 0.9 % bolus 1,000 mL (0 mLs Intravenous Stopped 07/28/13 0019)  ondansetron (ZOFRAN) injection 4 mg (4 mg Intravenous Given 07/27/13 2349)  sodium phosphate (FLEET) 7-19 GM/118ML enema 1 enema (1 enema Rectal Given 07/28/13 0123)    Filed Vitals:   07/27/13 2245 07/28/13 0156  BP: 125/82 124/80  Pulse: 79 61  Temp: 98.3 F (36.8 C)   TempSrc: Oral   Resp: 16 18  SpO2: 100% 97%        Enid SkeensJoshua M Zavitz, MD 08/07/13 0310

## 2013-12-10 ENCOUNTER — Encounter (HOSPITAL_COMMUNITY): Payer: Self-pay | Admitting: Emergency Medicine

## 2013-12-10 ENCOUNTER — Emergency Department (HOSPITAL_COMMUNITY)
Admission: EM | Admit: 2013-12-10 | Discharge: 2013-12-10 | Disposition: A | Discharge status: Home / Self Care | Payer: Medicaid Other | Attending: Emergency Medicine | Admitting: Emergency Medicine

## 2013-12-10 DIAGNOSIS — Z720 Tobacco use: Secondary | ICD-10-CM | POA: Diagnosis not present

## 2013-12-10 DIAGNOSIS — F1510 Other stimulant abuse, uncomplicated: Secondary | ICD-10-CM | POA: Diagnosis not present

## 2013-12-10 DIAGNOSIS — Z8719 Personal history of other diseases of the digestive system: Secondary | ICD-10-CM | POA: Diagnosis not present

## 2013-12-10 DIAGNOSIS — Y9389 Activity, other specified: Secondary | ICD-10-CM | POA: Insufficient documentation

## 2013-12-10 DIAGNOSIS — F1210 Cannabis abuse, uncomplicated: Secondary | ICD-10-CM | POA: Diagnosis not present

## 2013-12-10 DIAGNOSIS — Z8659 Personal history of other mental and behavioral disorders: Secondary | ICD-10-CM | POA: Insufficient documentation

## 2013-12-10 DIAGNOSIS — T402X1A Poisoning by other opioids, accidental (unintentional), initial encounter: Secondary | ICD-10-CM | POA: Insufficient documentation

## 2013-12-10 DIAGNOSIS — Y9289 Other specified places as the place of occurrence of the external cause: Secondary | ICD-10-CM | POA: Diagnosis not present

## 2013-12-10 DIAGNOSIS — F1310 Sedative, hypnotic or anxiolytic abuse, uncomplicated: Secondary | ICD-10-CM | POA: Insufficient documentation

## 2013-12-10 DIAGNOSIS — Z79891 Long term (current) use of opiate analgesic: Secondary | ICD-10-CM | POA: Diagnosis not present

## 2013-12-10 DIAGNOSIS — T50901A Poisoning by unspecified drugs, medicaments and biological substances, accidental (unintentional), initial encounter: Secondary | ICD-10-CM

## 2013-12-10 LAB — ETHANOL

## 2013-12-10 LAB — CBC WITH DIFFERENTIAL/PLATELET
Basophils Absolute: 0 10*3/uL (ref 0.0–0.1)
Basophils Relative: 0 % (ref 0–1)
Eosinophils Absolute: 0.1 10*3/uL (ref 0.0–0.7)
Eosinophils Relative: 1 % (ref 0–5)
HCT: 37.7 % — ABNORMAL LOW (ref 39.0–52.0)
Hemoglobin: 12.9 g/dL — ABNORMAL LOW (ref 13.0–17.0)
LYMPHS PCT: 18 % (ref 12–46)
Lymphs Abs: 1.4 10*3/uL (ref 0.7–4.0)
MCH: 29.5 pg (ref 26.0–34.0)
MCHC: 34.2 g/dL (ref 30.0–36.0)
MCV: 86.3 fL (ref 78.0–100.0)
Monocytes Absolute: 0.5 10*3/uL (ref 0.1–1.0)
Monocytes Relative: 7 % (ref 3–12)
Neutro Abs: 5.5 10*3/uL (ref 1.7–7.7)
Neutrophils Relative %: 74 % (ref 43–77)
PLATELETS: 216 10*3/uL (ref 150–400)
RBC: 4.37 MIL/uL (ref 4.22–5.81)
RDW: 11.7 % (ref 11.5–15.5)
WBC: 7.4 10*3/uL (ref 4.0–10.5)

## 2013-12-10 LAB — COMPREHENSIVE METABOLIC PANEL
ALT: 11 U/L (ref 0–53)
AST: 26 U/L (ref 0–37)
Albumin: 4.2 g/dL (ref 3.5–5.2)
Alkaline Phosphatase: 79 U/L (ref 39–117)
Anion gap: 13 (ref 5–15)
BUN: 29 mg/dL — ABNORMAL HIGH (ref 6–23)
CALCIUM: 9.1 mg/dL (ref 8.4–10.5)
CO2: 26 mEq/L (ref 19–32)
CREATININE: 1.07 mg/dL (ref 0.50–1.35)
Chloride: 98 mEq/L (ref 96–112)
GFR calc Af Amer: >90 mL/min (ref >90)
Glucose, Bld: 84 mg/dL (ref 70–99)
Potassium: 4.1 mEq/L (ref 3.7–5.3)
Sodium: 137 mEq/L (ref 137–147)
TOTAL PROTEIN: 7 g/dL (ref 6.0–8.3)
Total Bilirubin: 0.7 mg/dL (ref 0.3–1.2)

## 2013-12-10 LAB — RAPID URINE DRUG SCREEN, HOSP PERFORMED
Amphetamines: POSITIVE — AB
Barbiturates: NOT DETECTED
Benzodiazepines: POSITIVE — AB
Cocaine: NOT DETECTED
Opiates: NOT DETECTED
Tetrahydrocannabinol: POSITIVE — AB

## 2013-12-10 LAB — SALICYLATE LEVEL: Salicylate Lvl: <2 mg/dL — ABNORMAL LOW (ref 2.8–20.0)

## 2013-12-10 LAB — ACETAMINOPHEN LEVEL

## 2013-12-10 MED ORDER — SODIUM CHLORIDE 0.9 % IV SOLN
Freq: Once | INTRAVENOUS | Status: AC
  Administered 2013-12-10: 13:00:00 via INTRAVENOUS

## 2013-12-10 MED ORDER — SODIUM CHLORIDE 0.9 % IV BOLUS (SEPSIS)
1000.0000 mL | Freq: Once | INTRAVENOUS | Status: AC
  Administered 2013-12-10: 1000 mL via INTRAVENOUS

## 2013-12-10 NOTE — ED Notes (Signed)
Bed: RESA Expected date:  Expected time:  Means of arrival:  Comments: EMS/O.D. 

## 2013-12-10 NOTE — ED Notes (Signed)
Pt neurologically intact. No other questions/concerns. A&Ox4. Ambulatory. Patient sent with bottle of pills he came with. No other belongings with patient.

## 2013-12-10 NOTE — ED Notes (Signed)
MD Kohut at bedside. 
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
## 2013-12-10 NOTE — ED Notes (Signed)
Pt aware [facility] be [facility] after 1900 [facility].

## 2013-12-10 NOTE — Discharge Instructions (Signed)
Accidental Overdose °A drug overdose occurs when a chemical substance (drug or medication) is used in amounts large enough to overcome a person. This may result in severe illness or death. This is a type of poisoning. Accidental overdoses of medications or other substances come from a variety of reasons. When this happens accidentally, it is often because the person taking the substance does not know enough about what they have taken. Drugs which commonly cause overdose deaths are alcohol, psychotropic medications (medications which affect the mind), pain medications, illegal drugs (street drugs) such as cocaine and heroin, and multiple drugs taken at the same time. It may result from careless behavior (such as over-indulging at a party). Other causes of overdose may include multiple drug use, a lapse in memory, or drug use after a period of no drug use.  °Sometimes overdosing occurs because a person cannot remember if they have taken their medication.  °A common unintentional overdose in young children involves multi-vitamins containing iron. Iron is a part of the hemoglobin molecule in blood. It is used to transport oxygen to living cells. When taken in small amounts, iron allows the body to restock hemoglobin. In large amounts, it causes problems in the body. If this overdose is not treated, it can lead to death. °Never take medicines that show signs of tampering or do not seem quite right. Never take medicines in the dark or in poor lighting. Read the label and check each dose of medicine before you take it. When adults are poisoned, it happens most often through carelessness or lack of information. Taking medicines in the dark or taking medicine prescribed for someone else to treat the same type of problem is a dangerous practice. °SYMPTOMS  °Symptoms of overdose depend on the medication and amount taken. They can vary from over-activity with stimulant over-dosage, to sleepiness from depressants such as  alcohol, narcotics and tranquilizers. Confusion, dizziness, nausea and vomiting may be present. If problems are severe enough coma and death may result. °DIAGNOSIS  °Diagnosis and management are generally straightforward if the drug is known. Otherwise it is more difficult. At times, certain symptoms and signs exhibited by the patient, or blood tests, can reveal the drug in question.  °TREATMENT  °In an emergency department, most patients can be treated with supportive measures. Antidotes may be available if there has been an overdose of opioids or benzodiazepines. A rapid improvement will often occur if this is the cause of overdose. °At home or away from medical care: °· There may be no immediate problems or warning signs in children. °· Not everything works well in all cases of poisoning. °· Take immediate action. Poisons may act quickly. °· If you think someone has swallowed medicine or a household product, and the person is unconscious, having seizures (convulsions), or is not breathing, immediately call for an ambulance. °IF a person is conscious and appears to be doing OK but has swallowed a poison: °· Do not wait to see what effect the poison will have. Immediately call a poison control center (listed in the white pages of your telephone book under "Poison Control" or inside the front cover with other emergency numbers). Some poison control centers have TTY capability for the deaf. Check with your local center if you or someone in your family requires this service. °· Keep the container so you can read the label on the product for ingredients. °· Describe what, when, and how much was taken and the age and condition of the person poisoned.   Inform them if the person is vomiting, choking, drowsy, shows a change in color or temperature of skin, is conscious or unconscious, or is convulsing. °· Do not cause vomiting unless instructed by medical personnel. Do not induce vomiting or force liquids into a person who  is convulsing, unconscious, or very drowsy. °Stay calm and in control.  °· Activated charcoal also is sometimes used in certain types of poisoning and you may wish to add a supply to your emergency medicines. It is available without a prescription. Call a poison control center before using this medication. °PREVENTION  °Thousands of children die every year from unintentional poisoning. This may be from household chemicals, poisoning from carbon monoxide in a car, taking their parent's medications, or simply taking a few iron pills or vitamins with iron. Poisoning comes from unexpected sources. °· Store medicines out of the sight and reach of children, preferably in a locked cabinet. Do not keep medications in a food cabinet. Always store your medicines in a secure place. Get rid of expired medications. °· If you have children living with you or have them as occasional guests, you should have child-resistant caps on your medicine containers. Keep everything out of reach. Child proof your home. °· If you are called to the telephone or to answer the door while you are taking a medicine, take the container with you or put the medicine out of the reach of small children. °· Do not take your medication in front of children. Do not tell your child how good a medication is and how good it is for them. They may get the idea it is more of a treat. °· If you are an adult and have accidentally taken an overdose, you need to consider how this happened and what can be done to prevent it from happening again. If this was from a street drug or alcohol, determine if there is a problem that needs addressing. If you are not sure a problems exists, it is easy to talk to a professional and ask them if they think you have a problem. It is better to handle this problem in this way before it happens again and has a much worse consequence. °Document Released: 04/21/2004 Document Revised: 04/30/2011 Document Reviewed: 09/27/2008 °ExitCare®  Patient Information ©2015 [employer]. This information is not intended to replace advice given to you by your health care provider. Make sure you discuss any questions you have with your health care provider. ° °Emergency Department Resource Guide °1) Find a Doctor and Pay Out of Pocket °Although you won't have to find out who is covered by your insurance plan, it is a good idea to ask around and get recommendations. You will then need to call the office and see if the doctor you have chosen will accept you as a new patient and what types of options they offer for patients who are self-pay. Some doctors offer discounts or will set up payment plans for their patients who do not have insurance, but you will need to ask so you aren't surprised when you get to your appointment. ° °2) Contact Your Local Health Department °Not all health departments have doctors that can see patients for sick visits, but many do, so it is worth a call to see if yours does. If you don't know where your local health department is, you can check in your phone book. The CDC also has a tool to help you locate your state's health department, and many state websites   also have listings of all of their local health departments. ° °3) Find a Walk-in Clinic °If your illness is not likely to be very severe or complicated, you may want to try a walk in clinic. These are popping up all over the country in pharmacies, drugstores, and shopping centers. They're usually staffed by nurse practitioners or physician assistants that have been trained to treat common illnesses and complaints. They're usually fairly quick and inexpensive. However, if you have serious medical issues or chronic medical problems, these are probably not your best option. ° °No Primary Care Doctor: °- Call [facility] Connect at  832-8000 - they can help you locate a primary care doctor that  accepts your insurance, provides certain services, etc. °- Physician Referral Service-  1-800-533-3463 ° °Chronic Pain Problems: °Organization         Address  Phone   Notes  °[facility]  (336) 297-2271 Patients need to be referred by their primary care doctor.  ° °Medication Assistance: °Organization         Address  Phone   Notes  °[place] [facility] [address] °[place], [place] [address] (336) 641-8030 --Must be a resident of [place] °-- Must have NO insurance coverage whatsoever (no Medicaid/ Medicare, etc.) °-- The pt. MUST have a primary care doctor that directs their care regularly and follows them in the community °  °[facility]  (866) 331-1348   °[employer]  (888) 892-1162   ° °Agencies that provide inexpensive medical care: °Organization         Address  Phone   Notes  °[facility]  (336) 832-8035   °[facility]    (336) 832-7272   °[facility] [address] °[place], [place] [address] (336) 832-4777   °[facility] [address], °[place] (336) 271-4999   °[facility]    (336) 373-0678   °[facility]    (336) 272-1050   °[facility] ° [address], [place] Phone:  (336) 832-4444, Fax:  (336) 832-4440 Hours of Operation:  9 am - 6 pm, M-F.  Also accepts Medicaid/Medicare and self-pay.  °[facility] ° [address], [place] Phone: (336) [phone], Fax: (336) [phone]. Hours of Operation:  8:30 am - 5:30 pm, M-F.  Also accepts Medicaid and self-pay.  °[facility] [place] [address], [place] Phone: (336) 878-6027   °[facility] [address], [place], [place] (336)723-1848, Ext. 123 Mondays & Thursdays: 7-9 AM.  First 15 patients are seen on a first come, first serve basis. °  ° °Medicaid-accepting [place] Providers: ° °Organization         Address  Phone   Notes  °[facility] [address],  [place] (336) 641-2100 Also accepts self-pay patients.  °[facility] [address], [place] ° (336) 856-9996   °[facility] [address], [place] (336) 288-8857   °[facility] [address] [place] [address], [place] (336) 299-7000   °Veita Bland [address], [place]  ° (336) 373-1557 Only accepts [place] Access Medicaid patients after they have their name applied to their card.  ° °Self-Pay (no insurance) in [place]: ° °Organization         Address  Phone     Notes  °Sickle Cell Patients, [facility] [address], [place] (336) 832-1970   °[facility] [address], [place] (336) 832-4400   °[facility] [place] ° [address], [place] (336) 992-4800   °[facility]/Dr. Osei-Bonsu ° [address] [place] [address], [place] or [address], [place] (336) 841-8500 Phone number for both [place] and [place] locations is the same.  °[facility] [address], [place] (336) 299-0000   °[facility] [address] [place] [address], [place] or [address] (336) 852-7530 °(336) 878-2260   °[facility] [address], [place] (336) 350-1642, phone; (336) 294-5005, fax Sees patients 1st and 3rd Saturday of every month.  Must not qualify for public or private insurance (i.e. Medicaid, Medicare, [place] Health Choice, Veterans' Benefits) • Household income should be no more than 200% of the poverty level •The clinic cannot treat you if you are pregnant or think you are pregnant • Sexually transmitted diseases are not treated at the clinic.  ° ° °Dental Care: °Organization         Address  Phone  Notes  °[facility] [address], [place] (336) 641-6152 Accepts children up to age 21 who are enrolled in  Medicaid or [place] Health Choice; pregnant women with a Medicaid card; and children who have applied for Medicaid or [place] Health Choice, but were declined, whose parents can pay a reduced fee at time of service.  °[facility] [place]  [address], [place] (336) 641-7733 Accepts children up to age 21 who are enrolled in Medicaid or [place] Health Choice; pregnant women with a Medicaid card; and children who have applied for Medicaid or [place] Health Choice, but were declined, whose parents can pay a reduced fee at time of service.  °[facility] ° [address], [place] (336) 641-4533 Patients are seen by appointment only. Walk-ins are not accepted. [facility] will see patients 18 years of age and older. °Monday - Tuesday (8am-5pm) °Most Wednesdays (8:30-5pm) °$30 per visit, cash only  °[facility] ° [address], [place] (336) 641-4533 Patients are seen by appointment only. Walk-ins are not accepted. [facility] will see patients 18 years of age and older. °One Wednesday Evening (Monthly: Volunteer Based).  $30 per visit, cash only  °[facility]  (919) 537-3737 for adults; Children under age 4, call Graduate Pediatric Dentistry at (919) [phone]. Children aged 4-14, please call (919) [phone] to request a pediatric application. ° Dental services are provided in all areas of dental care including fillings, crowns and bridges, complete and partial dentures, implants, gum treatment, root canals, and extractions. Preventive care is also provided. Treatment is provided to both adults and children. °Patients are selected via a lottery and there is often a waiting list. °  °[facility] [address], ° ° (336) 763-8833 [email or web address] °  °[facility] [address], [place], [place] (336)723-1848, Ext. 123 Second and Fourth Thursday of each month, opens at 6:30  AM; Clinic ends at 9 AM.  Patients are seen on a first-come first-served basis, and a limited number are seen during each clinic.  ° °[facility] ° [address], [place], [place] (336) 723-7904     Eligibility Requirements °You must have lived in [place], [place], or [place] counties for at least the last three months. °  You cannot be eligible for state or federal sponsored healthcare insurance, including Veterans Administration, Medicaid, or Medicare. °  You generally cannot be eligible for healthcare insurance through your employer.  °  How to apply: °Eligibility screenings are held every Tuesday and Wednesday afternoon from 1:00 pm until 4:00 pm. You do not need an appointment for the interview!  °[facility] [address], [place], [place] 336-631-2330   °[facility]  336-342-8273   °[facility]  336-703-3100   °[facility]  336-570-6415   ° °Behavioral Health Resources in the Community: °Intensive Outpatient Programs °Organization         Address  Phone  Notes  °[facility] [address], [place], [place] 336-878-6098   °[facility] [address], [place], [place] 336-832-9800   °[facility] [address], [place], [place] ° 336-882-2125   °[facility] [address],  °[place], [place] 1-800-853-5163 or 336-641-4981   °Substance Abuse Resources °Organization         Address  Phone  Notes  °[facility]  336-882-2125   °[facility]  336-784-9470   °[facility]  336-285-9073   °[facility]  336-845-3988   °Residential & Outpatient Substance Abuse Program  1-800-659-3381   °Psychological Services °Organization         Address  Phone  Notes  °[facility]  336- 832-9600   °[facility]  336- 378-7881   °[facility] [address], [place] 1-800-853-5163 or  336-641-4981   ° °Mobile Crisis Teams °Organization         Address  Phone  Notes  °[facility], Mobile Crisis Care Unit  1-877-626-1772   °Assertive °[facility] ° [address] [place], [place] 336-834-9664   °Sharon DeEsch [address] °[place] 336-554-5454   ° °Self-Help/Support Groups °Organization         Address  Phone             Notes  °[facility] - variety of support groups  336- 373-1402 Call for more information  °Narcotics Anonymous (NA), [facility] [address], °[place]  2 meetings at this location  ° °Residential Treatment Programs °Organization         Address  Phone  Notes  °[facility] [address],    °[place]  1-866-801-8205   °[facility] ° [address], [place], [place] 704-293-8524   °[facility] [address], [place] 336-845-3988 Admissions: 8am-3pm M-F  °[facility] [address]    °[place], [place] 336-841-1104   °[facility] [address], [place], [place] 336-379-7146   °[facility] [address]  °[place], [place] 336-285-9073   °[facility] - Intensive Outpatient [address], [place], [place] 336-852-3033   °[facility] [address]  °[place], [place] 1-877-615-2722 or 336-784-9470   °[facility] [address] [place], [place] 336-227-7417 Accepts Medicaid  °[facility] [address]  ° [place] 1-800-659-3381 Substance Abuse/Addiction Treatment  ° °[place] Behavioral Health Resources °Organization           Address  Phone  Notes  °[facility]  (888) 581-9988   °Julie Brannon, PhD [address] [place], [place]   (336) 349-5553 or (336) 951-0000   °[facility]   [address] °[place], [place] (336) 349-4454   °[facility] [address],  [place], [place] (336) 342-8316 Insurance/Medicaid/sponsorship through [facility]  °[facility] [address]                                    [place], [place] (336) 342-8316 Therapy/tele-psych/case  °[facility] [address]  ° [place], [place] (336) 349-2233    °Dr. Arfeen  (336) 349-4544   °[facility] [place]  [employer] [place] [facility] 1) [address], [place] °2) [address], [place] °3)  [address], [place] (336) 349-3220 °(336) 342-7768 ° °(336) 342-8140   °[place] [facility] (336) 342-1394 or (336) 342-3537 (After Hours)    ° ° °

## 2013-12-10 NOTE — ED Notes (Signed)
Rosey Batheresa  (mother) updated on patient's condition. Phone number [phone]. Patient ok for mother to have information.

## 2013-12-10 NOTE — ED Notes (Signed)
Poison control closed case. 

## 2013-12-10 NOTE — ED Notes (Signed)
Per poison control-treat as a benzo OD

## 2013-12-10 NOTE — ED Notes (Signed)
Per Beth from poison control recommend EKG, aspirin level, tylenol level, CMP, CBC with diff, urine drug screen, EKG, and IV fluids. Monitor pt for at least 6 hours; maybe longer. Beth from poison control reports calling toxic and will call back for additional information.

## 2014-05-25 ENCOUNTER — Encounter (HOSPITAL_COMMUNITY): Payer: Self-pay | Admitting: Emergency Medicine

## 2014-05-25 ENCOUNTER — Ambulatory Visit (HOSPITAL_COMMUNITY): Payer: Medicaid Other | Attending: Physician Assistant

## 2014-05-25 ENCOUNTER — Emergency Department (HOSPITAL_COMMUNITY)
Admission: EM | Admit: 2014-05-25 | Discharge: 2014-05-25 | Disposition: A | Discharge status: Home / Self Care | Payer: Medicaid Other | Source: Home / Self Care | Attending: Family Medicine | Admitting: Family Medicine

## 2014-05-25 DIAGNOSIS — S93601A Unspecified sprain of right foot, initial encounter: Secondary | ICD-10-CM

## 2014-05-25 DIAGNOSIS — S92001A Unspecified fracture of right calcaneus, initial encounter for closed fracture: Secondary | ICD-10-CM | POA: Diagnosis not present

## 2014-05-25 DIAGNOSIS — X58XXXA Exposure to other specified factors, initial encounter: Secondary | ICD-10-CM | POA: Insufficient documentation

## 2014-05-25 DIAGNOSIS — M79671 Pain in right foot: Secondary | ICD-10-CM | POA: Diagnosis present

## 2014-05-25 MED ORDER — ACETAMINOPHEN 325 MG PO TABS
650.0000 mg | ORAL_TABLET | Freq: Once | ORAL | Status: AC
  Administered 2014-05-25: 650 mg via ORAL

## 2014-05-25 MED ORDER — ACETAMINOPHEN 325 MG PO TABS
ORAL_TABLET | ORAL | Status: AC
  Filled 2014-05-25: qty 2

## 2014-05-25 NOTE — ED Notes (Signed)
Patient was walking [place] creek bed, slipped on wet rock, twisted foot and now has pain in right foot.  Pulses 2 plus.

## 2014-05-25 NOTE — ED Notes (Signed)
Patient declined crutches.  Patient reports having crutches already

## 2014-05-25 NOTE — ED Provider Notes (Signed)
CSN: [id number]     Arrival date & time 05/25/14  1150 History   First MD Initiated Contact with Patient 05/25/14 1342     Chief Complaint  Patient presents with  . Foot Pain   (Consider location/radiation/quality/duration/timing/severity/associated sxs/prior Treatment) HPI Comments: States he injured his right foot while walking his dog near a creek yesterday evening. Twisted foot stepping too close to edge of creek. Foot was caught between some rocks. No previous injury/surgery. PCP: Jeannetta Nap   Patient is a 34 y.o. male presenting with lower extremity pain. The history is provided by the patient.  Foot Pain This is a new problem. The current episode started yesterday. The problem occurs constantly. The problem has not changed since onset.   Past Medical History  Diagnosis Date  . Benzodiazepine abuse   . History of IBS   . Depression   . Anxiety   . Drug use    History reviewed. No pertinent past surgical history. No family history on file. History  Substance Use Topics  . Smoking status: Current Every Day Smoker -- 0.50 packs/day    Types: Cigarettes  . Smokeless tobacco: Not on file  . Alcohol Use: No    Review of Systems  All other systems reviewed and are negative.   Allergies  Compazine; Nsaids; Phenergan; and Tramadol  Home Medications   Prior to Admission medications   Medication Sig Start Date End Date Taking? Authorizing Provider  buprenorphine-naloxone (SUBOXONE) 8-2 MG SUBL SL tablet Place 1 tablet under the tongue 3 (three) times daily.    Historical Provider, MD  polyethylene glycol powder (MIRALAX) powder Take 1 capful BID until stools are loose. 07/21/13   Shon Baton, MD   BP 109/74 mmHg  Pulse 94  Temp(Src) 98.1 F (36.7 C) (Oral)  Resp 16  SpO2 97% Physical Exam  Constitutional: He is oriented to person, place, and time. He appears well-developed and well-nourished. No distress.  HENT:  Head: Normocephalic and atraumatic.    Cardiovascular: Normal rate.   Pulmonary/Chest: Effort normal.  Musculoskeletal:       Right foot: There is tenderness, bony tenderness and swelling. There is normal range of motion, normal capillary refill, no crepitus, no deformity and no laceration.       Feet:  Outlined area with moderate STS and ecchymosis  Neurological: He is alert and oriented to person, place, and time.  Skin: Skin is warm and dry.  Psychiatric: He has a normal mood and affect. His behavior is normal.  Nursing note and vitals reviewed.   ED Course  Procedures (including critical care time) Labs Review Labs Reviewed - No data to display  Imaging Review Dg Foot Complete Right  05/25/2014   CLINICAL DATA:  Pain after foot caught between rocks  EXAM: RIGHT FOOT COMPLETE - 3+ VIEW  COMPARISON:  None.  FINDINGS: Frontal, oblique, and lateral views were obtained. There is a small avulsion along the lateral distal calcaneus, seen only on the frontal view. No other fracture apparent. No dislocation. Joint spaces appear intact. No erosive change.  IMPRESSION: Small avulsion along the lateral distal calcaneus seen on the frontal view. No other fracture apparent. No dislocation. No appreciable arthropathic change.   Electronically Signed   By: Bretta Bang III M.D.   On: 05/25/2014 15:14     MDM   1. Right foot sprain, initial encounter   Films as above. Will treat with ASO, crutches, and RICE therapy. Tylenol as directed on packaging for pain. Ortho  follow up (Dr. Wandra Feinstein. Murphy) if no improvement over the next 2-3 weeks.     Ria ClockJennifer Lee H Presson, [place] 05/25/14 1539

## 2014-05-25 NOTE — ED Notes (Signed)
[facility]/ ucc equipment not available currently

## 2014-05-25 NOTE — Discharge Instructions (Signed)
Splint and crutches as needed for comfort. Ice and elevation to reduce pain and swelling. Tylenol as directed on packaging for pain.  If symptoms do not improve over the next 2-3 weeks, please follow up with orthopedist (Dr. Eulah Pont) listed on your discharge paperwork.  Foot Sprain The muscles and cord like structures which attach muscle to bone (tendons) that surround the feet are made up of units. A foot sprain can occur at the weakest spot in any of these units. This condition is most often caused by injury to or overuse of the foot, as from playing contact sports, or aggravating a previous injury, or from poor conditioning, or obesity. SYMPTOMS  Pain with movement of the foot.  Tenderness and swelling at the injury site.  Loss of strength is present in moderate or severe sprains. THE THREE GRADES OR SEVERITY OF FOOT SPRAIN ARE:  Mild (Grade I): Slightly pulled muscle without tearing of muscle or tendon fibers or loss of strength.  Moderate (Grade II): Tearing of fibers in a muscle, tendon, or at the attachment to bone, with small decrease in strength.  Severe (Grade III): Rupture of the muscle-tendon-bone attachment, with separation of fibers. Severe sprain requires surgical repair. Often repeating (chronic) sprains are caused by overuse. Sudden (acute) sprains are caused by direct injury or over-use. DIAGNOSIS  Diagnosis of this condition is usually by your own observation. If problems continue, a caregiver may be required for further evaluation and treatment. X-rays may be required to make sure there are not breaks in the bones (fractures) present. Continued problems may require physical therapy for treatment. PREVENTION  Use strength and conditioning exercises appropriate for your sport.  Warm up properly prior to working out.  Use athletic shoes that are made for the sport you are participating in.  Allow adequate time for healing. Early return to activities makes repeat injury  more likely, and can lead to an unstable arthritic foot that can result in prolonged disability. Mild sprains generally heal in 3 to 10 days, with moderate and severe sprains taking 2 to 10 weeks. Your caregiver can help you determine the proper time required for healing. HOME CARE INSTRUCTIONS   Apply ice to the injury for 15-20 minutes, 03-04 times per day. Put the ice in a plastic bag and place a towel between the bag of ice and your skin.  An elastic wrap (like an Ace bandage) may be used to keep swelling down.  Keep foot above the level of the heart, or at least raised on a footstool, when swelling and pain are present.  Try to avoid use other than gentle range of motion while the foot is painful. Do not resume use until instructed by your caregiver. Then begin use gradually, not increasing use to the point of pain. If pain does develop, decrease use and continue the above measures, gradually increasing activities that do not cause discomfort, until you gradually achieve normal use.  Use crutches if and as instructed, and for the length of time instructed.  Keep injured foot and ankle wrapped between treatments.  Massage foot and ankle for comfort and to keep swelling down. Massage from the toes up towards the knee.  Only take over-the-counter or prescription medicines for pain, discomfort, or fever as directed by your caregiver. SEEK IMMEDIATE MEDICAL CARE IF:   Your pain and swelling increase, or pain is not controlled with medications.  You have loss of feeling in your foot or your foot turns cold or blue.  You  develop new, unexplained symptoms, or an increase of the symptoms that brought you to your caregiver. MAKE SURE YOU:   Understand these instructions.  Will watch your condition.  Will get help right away if you are not doing well or get worse. Document Released: 07/28/2001 Document Revised: 04/30/2011 Document Reviewed: 09/25/2007 [facility] Patient Information 2015  [employer], [place]. This information is not intended to replace advice given to you by your health care provider. Make sure you discuss any questions you have with your health care provider.

## 2014-12-17 ENCOUNTER — Other Ambulatory Visit: Payer: Self-pay

## 2014-12-17 ENCOUNTER — Emergency Department (HOSPITAL_COMMUNITY)
Admission: EM | Admit: 2014-12-17 | Discharge: 2014-12-18 | Disposition: A | Discharge status: Home / Self Care | Payer: Medicaid Other | Attending: Emergency Medicine | Admitting: Emergency Medicine

## 2014-12-17 ENCOUNTER — Encounter (HOSPITAL_COMMUNITY): Payer: Self-pay | Admitting: Emergency Medicine

## 2014-12-17 DIAGNOSIS — R002 Palpitations: Secondary | ICD-10-CM | POA: Insufficient documentation

## 2014-12-17 DIAGNOSIS — Z79899 Other long term (current) drug therapy: Secondary | ICD-10-CM | POA: Insufficient documentation

## 2014-12-17 DIAGNOSIS — R42 Dizziness and giddiness: Secondary | ICD-10-CM | POA: Insufficient documentation

## 2014-12-17 DIAGNOSIS — Z8719 Personal history of other diseases of the digestive system: Secondary | ICD-10-CM | POA: Insufficient documentation

## 2014-12-17 DIAGNOSIS — F419 Anxiety disorder, unspecified: Secondary | ICD-10-CM | POA: Diagnosis not present

## 2014-12-17 DIAGNOSIS — R079 Chest pain, unspecified: Secondary | ICD-10-CM | POA: Diagnosis not present

## 2014-12-17 DIAGNOSIS — R110 Nausea: Secondary | ICD-10-CM | POA: Insufficient documentation

## 2014-12-17 DIAGNOSIS — R000 Tachycardia, unspecified: Secondary | ICD-10-CM | POA: Insufficient documentation

## 2014-12-17 DIAGNOSIS — Z720 Tobacco use: Secondary | ICD-10-CM | POA: Insufficient documentation

## 2014-12-17 MED ORDER — SODIUM CHLORIDE 0.9 % IV BOLUS (SEPSIS)
1000.0000 mL | Freq: Once | INTRAVENOUS | Status: AC
  Administered 2014-12-18: 1000 mL via INTRAVENOUS
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
## 2014-12-17 NOTE — ED Provider Notes (Signed)
CSN: [id number]     Arrival date & time 12/17/14  2337 History  By signing my name below, I, Emmanuella Mensah, attest that this documentation has been prepared under the direction and in the presence of Tomasita CrumbleAdeleke Denver Harder, MD. Electronically Signed: Angelene GiovanniEmmanuella Mensah, ED Scribe. 12/17/2014. 11:56 PM.    Chief Complaint  Patient presents with  . Chest Pain   The history is provided by the patient. No language interpreter was used.   HPI Comments: [name] is a 34 y.o. male with a hx of anxiety brought in by ambulance, who presents to the Emergency Department complaining of tachycardia with palpitations and chest tightness onset this evening PTA.  He reports associated dizziness and nausea. He denies any vomiting, diaphoresis, fever, rhinorrhea, vomiting, or diarrhea. He states that he is currently under increased amount of stress. Pt explains that his symptoms started while he was getting something to drink. He states that he is here today because of the intensity of his tachycardia which was different from his normal anxiety. He is currently on Xanax and denies any personal hx of heart issues but his grandfather died of an aortic valve rupture. He also denies a hx of DVT/PE. He denies any recent travels. Per EMS, pt reports that his baseline HR is 100 and HR upon arrival was 140-160.  Past Medical History  Diagnosis Date  . Benzodiazepine abuse   . History of IBS   . Depression   . Anxiety   . Drug use    No past surgical history on file. No family history on file. Social History  Substance Use Topics  . Smoking status: Current Every Day Smoker -- 0.50 packs/day    Types: Cigarettes  . Smokeless tobacco: Not on file  . Alcohol Use: No    Review of Systems  Constitutional: Negative for fever and diaphoresis.  HENT: Negative for rhinorrhea.   Respiratory: Positive for chest tightness.   Cardiovascular: Positive for palpitations.  Gastrointestinal: Positive for nausea. Negative  for vomiting and diarrhea.  Neurological: Positive for dizziness.  Psychiatric/Behavioral: The patient is nervous/anxious.       Allergies  Compazine; Nsaids; Phenergan; and Tramadol  Home Medications   Prior to Admission medications   Medication Sig Start Date End Date Taking? Authorizing Provider  buprenorphine-naloxone (SUBOXONE) 8-2 MG SUBL SL tablet Place 1 tablet under the tongue 3 (three) times daily.    Historical Provider, MD  polyethylene glycol powder (MIRALAX) powder Take 1 capful BID until stools are loose. 07/21/13   Shon Batonourtney F Horton, MD   SpO2 99% Physical Exam  Constitutional: He is oriented to person, place, and time. Vital signs are normal. He appears well-developed and well-nourished.  Non-toxic appearance. He does not appear ill. No distress.  Appears anxious  HENT:  Head: Normocephalic and atraumatic.  Nose: Nose normal.  Mouth/Throat: Oropharynx is clear and moist. No oropharyngeal exudate.  Eyes: Conjunctivae and EOM are normal. Pupils are equal, round, and reactive to light. No scleral icterus.  Neck: Normal range of motion. Neck supple. No tracheal deviation, no edema, no erythema and normal range of motion present. No thyroid mass and no thyromegaly present.  Cardiovascular: Normal rate, S1 normal, S2 normal, normal heart sounds, intact distal pulses and normal pulses.  Exam reveals no gallop and no friction rub.   No murmur heard. Pulses:      Radial pulses are 2+ on the right side, and 2+ on the left side.       Dorsalis  pedis pulses are 2+ on the right side, and 2+ on the left side.  Tachycardic  Pulmonary/Chest: Effort normal and breath sounds normal. No respiratory distress. He has no wheezes. He has no rhonchi. He has no rales.  Abdominal: Soft. Normal appearance and bowel sounds are normal. He exhibits no distension, no ascites and no mass. There is no hepatosplenomegaly. There is no tenderness. There is no rebound, no guarding and no CVA tenderness.   Musculoskeletal: Normal range of motion. He exhibits no edema or tenderness.  Lymphadenopathy:    He has no cervical adenopathy.  Neurological: He is alert and oriented to person, place, and time. He has normal strength. No cranial nerve deficit or sensory deficit.  Skin: Skin is warm, dry and intact. No petechiae and no rash noted. He is not diaphoretic. No erythema. No pallor.  Psychiatric: He has a normal mood and affect. His behavior is normal. Judgment normal.  Nursing note and vitals reviewed.   ED Course  Procedures (including critical care time) DIAGNOSTIC STUDIES: Oxygen Saturation is 97% on RA, adequate by my interpretation.    COORDINATION OF CARE: 11:41 PM- Pt advised of plan for treatment and pt agrees. Will monitor pt's HR in ED.    Labs Review Labs Reviewed  CBC WITH DIFFERENTIAL/PLATELET - Abnormal; Notable for the following:    Hemoglobin 12.5 (*)    HCT 38.5 (*)    All other components within normal limits  BASIC METABOLIC PANEL  MAGNESIUM    Imaging Review Dg Chest 2 View  12/18/2014  CLINICAL DATA:  Tachycardia with chest pain and shortness of breath EXAM: CHEST  2 VIEW COMPARISON:  July 25, 2013 FINDINGS: There is a small granuloma in the left base. There is no edema or consolidation. Heart size and pulmonary vascularity are normal. No adenopathy. No pneumothorax. No bone lesions. IMPRESSION: No edema or consolidation. Electronically Signed   By: Bretta Bang III M.D.   On: 12/18/2014 01:42   Tomasita Crumble, MD has personally reviewed and evaluated these images and lab results as part of his medical decision-making.   EKG Interpretation   Date/Time:  Saturday December 18 2014 01:23:10 EDT Ventricular Rate:  79 PR Interval:  154 QRS Duration: 88 QT Interval:  367 QTC Calculation: 421 R Axis:   88 Text Interpretation:  Sinus rhythm ST elev, probable normal early repol  pattern No significant change since last tracing Confirmed by Erroll Luna [id number]) on 12/18/2014 1:29:18 AM      MDM   Final diagnoses:  None    Patient presents to the emergency department for chest pain and tachycardia. He states that this is consistent with his anxiety however the chest pain is slightly worse. His history is not consistent with ACS.  He has no risk factors for PE.  His HR came down to 85 without any intervention, this is prior to IVF administration.  CXR and EKG are unremarkable.  Patient was observed without any recurrence of his CP or tachycardia. He appears well and in NAD.  VS remain within his normal limits and he is safe for DC.  I personally performed the services described in this documentation, which was scribed in my presence. The recorded information has been reviewed and is accurate.      Tomasita Crumble, MD 12/18/14 [phone]

## 2014-12-17 NOTE — ED Notes (Signed)
Per EMS:  Pt here from home after experiencing a high heart rate and chest discomfort.  EMS states pt's HR on arrival was 140-160, but during transport HR came down with calming techniques.  Pt has a hx of drug abuse, withdrawals, and anxiety.  Pt sts this feels different than his previous anxiety, but does agree he has been under a lot of stress recently.

## 2014-12-18 ENCOUNTER — Emergency Department (HOSPITAL_COMMUNITY): Payer: Medicaid Other

## 2014-12-18 LAB — BASIC METABOLIC PANEL
Anion gap: 9 (ref 5–15)
BUN: 13 mg/dL (ref 6–20)
CHLORIDE: 101 mmol/L (ref 101–111)
CO2: 32 mmol/L (ref 22–32)
Calcium: 9.2 mg/dL (ref 8.9–10.3)
Creatinine, Ser: 1 mg/dL (ref 0.61–1.24)
GFR calc Af Amer: >60 mL/min (ref >60)
GFR calc non Af Amer: >60 mL/min (ref >60)
Glucose, Bld: 98 mg/dL (ref 65–99)
POTASSIUM: 3.7 mmol/L (ref 3.5–5.1)
Sodium: 142 mmol/L (ref 135–145)

## 2014-12-18 LAB — CBC WITH DIFFERENTIAL/PLATELET
Basophils Absolute: 0 10*3/uL (ref 0.0–0.1)
Basophils Relative: 0 %
EOS ABS: 0.2 10*3/uL (ref 0.0–0.7)
EOS PCT: 3 %
HCT: 38.5 % — ABNORMAL LOW (ref 39.0–52.0)
HEMOGLOBIN: 12.5 g/dL — AB (ref 13.0–17.0)
LYMPHS ABS: 1.3 10*3/uL (ref 0.7–4.0)
Lymphocytes Relative: 25 %
MCH: 28.9 pg (ref 26.0–34.0)
MCHC: 32.5 g/dL (ref 30.0–36.0)
MCV: 88.9 fL (ref 78.0–100.0)
Monocytes Absolute: 0.3 10*3/uL (ref 0.1–1.0)
Monocytes Relative: 6 %
Neutro Abs: 3.6 10*3/uL (ref 1.7–7.7)
Neutrophils Relative %: 66 %
PLATELETS: 221 10*3/uL (ref 150–400)
RBC: 4.33 MIL/uL (ref 4.22–5.81)
RDW: 12.8 % (ref 11.5–15.5)
WBC: 5.4 10*3/uL (ref 4.0–10.5)

## 2014-12-18 LAB — MAGNESIUM: Magnesium: 2.1 mg/dL (ref 1.7–2.4)

## 2014-12-18 NOTE — Discharge Instructions (Signed)
Nonspecific Chest Pain [name], your work up in the ED tonight was normal.  Your heart rate has also come back down to normal.  See your primary doctor within 3 days for close follow up.  If symptoms worsen, come back to the ED immediatley  Thank you. It is often hard to find the cause of chest pain. There is always a chance that your pain could be related to something serious, such as a heart attack or a blood clot in your lungs. Chest pain can also be caused by conditions that are not life-threatening. If you have chest pain, it is very important to follow up with your doctor.  HOME CARE  If you were prescribed an antibiotic medicine, finish it all even if you start to feel better.  Avoid any activities that cause chest pain.  Do not use any tobacco products, including cigarettes, chewing tobacco, or electronic cigarettes. If you need help quitting, ask your doctor.  Do not drink alcohol.  Take medicines only as told by your doctor.  Keep all follow-up visits as told by your doctor. This is important. This includes any further testing if your chest pain does not go away.  Your doctor may tell you to keep your head raised (elevated) while you sleep.  Make lifestyle changes as told by your doctor. These may include:  Getting regular exercise. Ask your doctor to suggest some activities that are safe for you.  Eating a heart-healthy diet. Your doctor or a diet specialist (dietitian) can help you to learn healthy eating options.  Maintaining a healthy weight.  Managing diabetes, if necessary.  Reducing stress. GET HELP IF:  Your chest pain does not go away, even after treatment.  You have a rash with blisters on your chest.  You have a fever. GET HELP RIGHT AWAY IF:  Your chest pain is worse.  You have an increasing cough, or you cough up blood.  You have severe belly (abdominal) pain.  You feel extremely weak.  You pass out (faint).  You have chills.  You have  sudden, unexplained chest discomfort.  You have sudden, unexplained discomfort in your arms, back, neck, or jaw.  You have shortness of breath at any time.  You suddenly start to sweat, or your skin gets clammy.  You feel nauseous.  You vomit.  You suddenly feel light-headed or dizzy.  Your heart begins to beat quickly, or it feels like it is skipping beats. These symptoms may be an emergency. Do not wait to see if the symptoms will go away. Get medical help right away. Call your local emergency services (911 in the [place] Do not drive yourself to the hospital.   This information is not intended to replace advice given to you by your health care provider. Make sure you discuss any questions you have with your health care provider.   Document Released: 07/25/2007 Document Revised: 02/26/2014 Document Reviewed: 09/11/2013 Elsevier Interactive Patient Education [employer].

## 2014-12-18 NOTE — ED Notes (Signed)
Pt left with all belongings and ambulated out of treatment area.  

## 2014-12-18 NOTE — ED Notes (Signed)
MD at bedside. 

## 2015-01-20 ENCOUNTER — Emergency Department (HOSPITAL_COMMUNITY)
Admission: EM | Admit: 2015-01-20 | Discharge: 2015-01-20 | Discharge status: Against Medical Advice | Payer: Medicaid Other | Attending: Emergency Medicine | Admitting: Emergency Medicine

## 2015-01-20 ENCOUNTER — Emergency Department (HOSPITAL_COMMUNITY): Payer: Medicaid Other

## 2015-01-20 ENCOUNTER — Encounter (HOSPITAL_COMMUNITY): Payer: Self-pay

## 2015-01-20 DIAGNOSIS — J159 Unspecified bacterial pneumonia: Secondary | ICD-10-CM | POA: Insufficient documentation

## 2015-01-20 DIAGNOSIS — R21 Rash and other nonspecific skin eruption: Secondary | ICD-10-CM

## 2015-01-20 DIAGNOSIS — F17210 Nicotine dependence, cigarettes, uncomplicated: Secondary | ICD-10-CM | POA: Diagnosis not present

## 2015-01-20 DIAGNOSIS — F419 Anxiety disorder, unspecified: Secondary | ICD-10-CM | POA: Diagnosis not present

## 2015-01-20 DIAGNOSIS — R05 Cough: Secondary | ICD-10-CM | POA: Diagnosis present

## 2015-01-20 DIAGNOSIS — Z8719 Personal history of other diseases of the digestive system: Secondary | ICD-10-CM | POA: Diagnosis not present

## 2015-01-20 DIAGNOSIS — Z79899 Other long term (current) drug therapy: Secondary | ICD-10-CM | POA: Diagnosis not present

## 2015-01-20 DIAGNOSIS — R000 Tachycardia, unspecified: Secondary | ICD-10-CM | POA: Diagnosis not present

## 2015-01-20 DIAGNOSIS — B379 Candidiasis, unspecified: Secondary | ICD-10-CM | POA: Insufficient documentation

## 2015-01-20 DIAGNOSIS — F329 Major depressive disorder, single episode, unspecified: Secondary | ICD-10-CM | POA: Insufficient documentation

## 2015-01-20 DIAGNOSIS — J189 Pneumonia, unspecified organism: Secondary | ICD-10-CM

## 2015-01-20 DIAGNOSIS — B370 Candidal stomatitis: Secondary | ICD-10-CM

## 2015-01-20 LAB — RAPID HIV SCREEN (HIV 1/2 AB+AG)
HIV 1/2 ANTIBODIES: NONREACTIVE
HIV-1 P24 Antigen - HIV24: NONREACTIVE

## 2015-01-20 LAB — CBC WITH DIFFERENTIAL/PLATELET
BASOS PCT: 0 %
Basophils Absolute: 0 10*3/uL (ref 0.0–0.1)
EOS ABS: 0 10*3/uL (ref 0.0–0.7)
Eosinophils Relative: 0 %
HEMATOCRIT: 39.8 % (ref 39.0–52.0)
Hemoglobin: 13 g/dL (ref 13.0–17.0)
Lymphocytes Relative: 15 %
Lymphs Abs: 1 10*3/uL (ref 0.7–4.0)
MCH: 29.5 pg (ref 26.0–34.0)
MCHC: 32.7 g/dL (ref 30.0–36.0)
MCV: 90.5 fL (ref 78.0–100.0)
MONO ABS: 0.3 10*3/uL (ref 0.1–1.0)
MONOS PCT: 5 %
NEUTROS ABS: 5.4 10*3/uL (ref 1.7–7.7)
Neutrophils Relative %: 80 %
Platelets: 212 10*3/uL (ref 150–400)
RBC: 4.4 MIL/uL (ref 4.22–5.81)
RDW: 13 % (ref 11.5–15.5)
WBC: 6.8 10*3/uL (ref 4.0–10.5)

## 2015-01-20 LAB — COMPREHENSIVE METABOLIC PANEL
ALBUMIN: 3.9 g/dL (ref 3.5–5.0)
ALK PHOS: 81 U/L (ref 38–126)
ALT: 17 U/L (ref 17–63)
AST: 31 U/L (ref 15–41)
Anion gap: 6 (ref 5–15)
BUN: 14 mg/dL (ref 6–20)
CALCIUM: 8.9 mg/dL (ref 8.9–10.3)
CO2: 30 mmol/L (ref 22–32)
CREATININE: 0.98 mg/dL (ref 0.61–1.24)
Chloride: 101 mmol/L (ref 101–111)
GFR calc Af Amer: >60 mL/min (ref >60)
GFR calc non Af Amer: >60 mL/min (ref >60)
GLUCOSE: 93 mg/dL (ref 65–99)
Potassium: 3.8 mmol/L (ref 3.5–5.1)
SODIUM: 137 mmol/L (ref 135–145)
Total Bilirubin: 0.6 mg/dL (ref 0.3–1.2)
Total Protein: 7.3 g/dL (ref 6.5–8.1)

## 2015-01-20 LAB — I-STAT CG4 LACTIC ACID, ED: Lactic Acid, Venous: 0.83 mmol/L (ref 0.5–2.0)

## 2015-01-20 MED ORDER — CLOTRIMAZOLE 10 MG MT TROC: 10.0000 mg | Freq: Every day | OROMUCOSAL | Status: DC

## 2015-01-20 MED ORDER — ACETAMINOPHEN 325 MG PO TABS
650.0000 mg | ORAL_TABLET | Freq: Once | ORAL | Status: AC
  Administered 2015-01-20: 650 mg via ORAL
  Filled 2015-01-20: qty 2

## 2015-01-20 MED ORDER — SODIUM CHLORIDE 0.9 % IV BOLUS (SEPSIS)
1000.0000 mL | INTRAVENOUS | Status: AC
  Administered 2015-01-20 (×2): 1000 mL via INTRAVENOUS

## 2015-01-20 MED ORDER — SODIUM CHLORIDE 0.9 % IV BOLUS (SEPSIS): 500.0000 mL | INTRAVENOUS | Status: AC

## 2015-01-20 MED ORDER — LEVOFLOXACIN 750 MG PO TABS: 750.0000 mg | ORAL_TABLET | Freq: Every day | ORAL | Status: DC

## 2015-01-20 MED ORDER — DEXTROSE 5 % IV SOLN
500.0000 mg | Freq: Once | INTRAVENOUS | Status: AC
  Administered 2015-01-20: 500 mg via INTRAVENOUS
  Filled 2015-01-20: qty 500

## 2015-01-20 MED ORDER — DEXTROSE 5 % IV SOLN
1.0000 g | INTRAVENOUS | Status: DC
  Administered 2015-01-20: 1 g via INTRAVENOUS
  Filled 2015-01-20: qty 10

## 2015-01-20 NOTE — ED Notes (Signed)
Pt stable, ambulatory, states understanding of discharge instructions 

## 2015-01-20 NOTE — Discharge Instructions (Signed)
You are leaving the ER AGAINST MEDICAL ADVICE. I am more ED you have an immune system problem in combination with pneumonia. This may be what is contributing to your rash as well. I have recommended IV antibiotics. Either way I wanted to take these oral antibiotics and follow-up with your primary care physician as soon as possible, ideally tomorrow. If any symptoms worsen come back to the ER. You may come back at any time.

## 2015-01-20 NOTE — ED Notes (Signed)
Pt presents with multiple areas of redness, pain and blisters, the first area was on L wrist-first noted yesterday; reports large blister to area that popped, denies any injury to areas; also reports cough x 2 days, reports productive cough that is brown-green; denies any shortness of breath.

## 2015-01-20 NOTE — ED Provider Notes (Signed)
CSN: [id number]     Arrival date & time 01/20/15  0932 History   First MD Initiated Contact with Patient 01/20/15 1201     Chief Complaint  Patient presents with  . Wound Infection     (Consider location/radiation/quality/duration/timing/severity/associated sxs/prior Treatment) HPI  34 year old male presents with a chief complaint of a blister to his left wrist. Has also noticed a rash to his legs. This all started yesterday. Patient doesn't nausea these been having fever for the past 4 days, up to 102. Over last 36 hours started having productive cough with green sputum. Denies shortness of breath. Has been having body aches for the last 4 days. No chest pain or shortness of breath. Started having a headache when the cough started. No neck stiffness. Patient states the blister his biggest on his left wrist and popped yesterday with clear fluid draining. Also noticed some redness and rash to his left lateral calf. Patient has a history of IV drug abuse in the past but denies any for over 1 year. Denies history of HIV.  Past Medical History  Diagnosis Date  . Benzodiazepine abuse   . History of IBS   . Depression   . Anxiety   . Drug use    History reviewed. No pertinent past surgical history. History reviewed. No pertinent family history. Social History  Substance Use Topics  . Smoking status: Current Every Day Smoker -- 0.50 packs/day    Types: Cigarettes  . Smokeless tobacco: None  . Alcohol Use: No    Review of Systems  Constitutional: Positive for fever and chills.  HENT: Positive for congestion.   Respiratory: Positive for cough. Negative for shortness of breath.   Cardiovascular: Negative for chest pain.  Gastrointestinal: Negative for vomiting and abdominal pain.  Genitourinary: Negative for dysuria.  Musculoskeletal: Positive for arthralgias.  Skin: Positive for rash.  All other systems reviewed and are negative.     Allergies  Compazine; Nsaids; Phenergan; and  Tramadol  Home Medications   Prior to Admission medications   Medication Sig Start Date End Date Taking? Authorizing Provider  Buprenorphine HCl-Naloxone HCl (SUBOXONE) 12-3 MG FILM Place 1 strip under the tongue 2 (two) times daily.   Yes Historical Provider, MD  diphenhydrAMINE (BENADRYL) 25 mg capsule Take 50 mg by mouth at bedtime as needed for sleep.   Yes Historical Provider, MD  methylphenidate (RITALIN) 20 MG tablet Take 60 mg by mouth daily.   Yes Historical Provider, MD  clonazePAM (KLONOPIN) 0.5 MG tablet Take 1 mg by mouth daily.    Historical Provider, MD   BP 114/66 mmHg  Pulse 132  Temp(Src) 102.5 F (39.2 C) (Oral)  Resp 18  Ht 5\' 9"  (1.753 m)  Wt 148 lb (67.132 kg)  BMI 21.85 kg/m2  SpO2 98% Physical Exam  Constitutional: He is oriented to person, place, and time. He appears well-developed and well-nourished.  Appears gaunt, looks chronically ill.   HENT:  Head: Normocephalic and atraumatic.  Right Ear: External ear normal.  Left Ear: External ear normal.  Nose: Nose normal.  Thrush  Eyes: Right eye exhibits no discharge. Left eye exhibits no discharge.  Neck: Normal range of motion and full passive range of motion without pain. Neck supple.  No meningismus  Cardiovascular: Regular rhythm, normal heart sounds and intact distal pulses.  Tachycardia present.   Pulmonary/Chest: Effort normal. He has wheezes.  Abdominal: Soft. He exhibits no distension. There is no tenderness.  Musculoskeletal: He exhibits no edema.  Neurological:  He is alert and oriented to person, place, and time.  Skin: Skin is warm and dry. Rash noted. No petechiae noted.     Nursing note and vitals reviewed.   ED Course  Procedures (including critical care time) Labs Review Labs Reviewed  CULTURE, BLOOD (ROUTINE X 2)  CULTURE, BLOOD (ROUTINE X 2)  URINE CULTURE  COMPREHENSIVE METABOLIC PANEL  CBC WITH DIFFERENTIAL/PLATELET  RAPID HIV SCREEN (HIV 1/2 AB+AG)  URINALYSIS, ROUTINE W  REFLEX MICROSCOPIC (NOT AT [facility])  I-STAT CG4 LACTIC ACID, ED  I-STAT CG4 LACTIC ACID, ED    Imaging Review Dg Chest 2 View  01/20/2015  CLINICAL DATA:  Productive cough with fever for 36 hours EXAM: CHEST  2 VIEW COMPARISON:  December 18, 2014 FINDINGS: There is focal infiltrate in the inferior lingula. Lungs elsewhere clear. Heart size and pulmonary vascularity are normal. No adenopathy. No bone lesions. IMPRESSION: Focal infiltrate consistent with pneumonia in the inferior lingula. Electronically Signed   By: Bretta Bang III M.D.   On: 01/20/2015 13:18   I have personally reviewed and evaluated these images and lab results as part of my medical decision-making.   EKG Interpretation None      MDM   Final diagnoses:  Community acquired pneumonia  Thrush  Rash and nonspecific skin eruption    Patient's fever appears to be coming from community acquired pneumonia. He has no respiratory distress or hypoxia. His rash is nonspecific with different types of rashes noted including plaques and pustules/bullae. The rash is intermittent. No petechiae. No sloughing to suggest TEN. No CNS symptoms. Given his thrush, atypical rash, and fever with history of prior drug abuse I am quite concerned about immunocompromise. I have discussed this with the patient. His lab work is unremarkable however he refuses to be admitted. He states he has a sick mother at home and he is really caregiver. I have discussed the possible seriousness of his illness but he still declines admission. He will leave AGAINST MEDICAL ADVICE. He has been advisable return precautions and he states if he is not better or gets worse he will return tomorrow. He has a PCP he feels he can follow-up within the next day or so. Given my concern for immunocompromise, I will treat with Levaquin for his pneumonia symptoms. Rapid HIV is negative.    Pricilla Loveless, MD 01/20/15 1556

## 2015-01-25 LAB — CULTURE, BLOOD (ROUTINE X 2)
CULTURE: NO GROWTH
Culture: NO GROWTH

## 2015-08-29 ENCOUNTER — Inpatient Hospital Stay (HOSPITAL_COMMUNITY)
Admission: EM | Admit: 2015-08-29 | Discharge: 2015-09-02 | DRG: 917 | Disposition: A | Discharge status: Home / Self Care | Payer: Medicaid Other | Attending: Internal Medicine | Admitting: Internal Medicine

## 2015-08-29 ENCOUNTER — Emergency Department (HOSPITAL_COMMUNITY): Payer: Medicaid Other

## 2015-08-29 ENCOUNTER — Encounter (HOSPITAL_COMMUNITY): Payer: Self-pay | Admitting: Emergency Medicine

## 2015-08-29 DIAGNOSIS — Z885 Allergy status to narcotic agent status: Secondary | ICD-10-CM

## 2015-08-29 DIAGNOSIS — G92 Toxic encephalopathy: Secondary | ICD-10-CM | POA: Diagnosis present

## 2015-08-29 DIAGNOSIS — Z720 Tobacco use: Secondary | ICD-10-CM | POA: Diagnosis present

## 2015-08-29 DIAGNOSIS — D649 Anemia, unspecified: Secondary | ICD-10-CM | POA: Diagnosis present

## 2015-08-29 DIAGNOSIS — F419 Anxiety disorder, unspecified: Secondary | ICD-10-CM | POA: Diagnosis present

## 2015-08-29 DIAGNOSIS — F1310 Sedative, hypnotic or anxiolytic abuse, uncomplicated: Secondary | ICD-10-CM | POA: Diagnosis present

## 2015-08-29 DIAGNOSIS — K589 Irritable bowel syndrome without diarrhea: Secondary | ICD-10-CM | POA: Diagnosis present

## 2015-08-29 DIAGNOSIS — Z765 Malingerer [conscious simulation]: Secondary | ICD-10-CM

## 2015-08-29 DIAGNOSIS — Z888 Allergy status to other drugs, medicaments and biological substances status: Secondary | ICD-10-CM

## 2015-08-29 DIAGNOSIS — F1910 Other psychoactive substance abuse, uncomplicated: Secondary | ICD-10-CM | POA: Diagnosis present

## 2015-08-29 DIAGNOSIS — D72829 Elevated white blood cell count, unspecified: Secondary | ICD-10-CM | POA: Diagnosis present

## 2015-08-29 DIAGNOSIS — R0682 Tachypnea, not elsewhere classified: Secondary | ICD-10-CM | POA: Diagnosis present

## 2015-08-29 DIAGNOSIS — F17210 Nicotine dependence, cigarettes, uncomplicated: Secondary | ICD-10-CM | POA: Diagnosis present

## 2015-08-29 DIAGNOSIS — T50905A Adverse effect of unspecified drugs, medicaments and biological substances, initial encounter: Secondary | ICD-10-CM | POA: Diagnosis present

## 2015-08-29 DIAGNOSIS — T424X1A Poisoning by benzodiazepines, accidental (unintentional), initial encounter: Secondary | ICD-10-CM | POA: Diagnosis present

## 2015-08-29 DIAGNOSIS — L409 Psoriasis, unspecified: Secondary | ICD-10-CM | POA: Diagnosis present

## 2015-08-29 DIAGNOSIS — Z886 Allergy status to analgesic agent status: Secondary | ICD-10-CM

## 2015-08-29 DIAGNOSIS — T50901A Poisoning by unspecified drugs, medicaments and biological substances, accidental (unintentional), initial encounter: Secondary | ICD-10-CM

## 2015-08-29 DIAGNOSIS — Z79899 Other long term (current) drug therapy: Secondary | ICD-10-CM

## 2015-08-29 DIAGNOSIS — F329 Major depressive disorder, single episode, unspecified: Secondary | ICD-10-CM | POA: Diagnosis present

## 2015-08-29 DIAGNOSIS — I959 Hypotension, unspecified: Secondary | ICD-10-CM | POA: Diagnosis present

## 2015-08-29 DIAGNOSIS — F1123 Opioid dependence with withdrawal: Secondary | ICD-10-CM | POA: Diagnosis present

## 2015-08-29 DIAGNOSIS — R0902 Hypoxemia: Secondary | ICD-10-CM | POA: Diagnosis present

## 2015-08-29 DIAGNOSIS — T402X1A Poisoning by other opioids, accidental (unintentional), initial encounter: Secondary | ICD-10-CM | POA: Diagnosis not present

## 2015-08-29 DIAGNOSIS — F1210 Cannabis abuse, uncomplicated: Secondary | ICD-10-CM | POA: Diagnosis present

## 2015-08-29 DIAGNOSIS — F4310 Post-traumatic stress disorder, unspecified: Secondary | ICD-10-CM | POA: Diagnosis present

## 2015-08-29 LAB — COMPREHENSIVE METABOLIC PANEL
ALT: 17 U/L (ref 17–63)
AST: 22 U/L (ref 15–41)
Albumin: 3.6 g/dL (ref 3.5–5.0)
Alkaline Phosphatase: 88 U/L (ref 38–126)
Anion gap: 8 (ref 5–15)
BILIRUBIN TOTAL: 0.5 mg/dL (ref 0.3–1.2)
BUN: 16 mg/dL (ref 6–20)
CHLORIDE: 107 mmol/L (ref 101–111)
CO2: 24 mmol/L (ref 22–32)
CREATININE: 0.79 mg/dL (ref 0.61–1.24)
Calcium: 8.1 mg/dL — ABNORMAL LOW (ref 8.9–10.3)
Glucose, Bld: 147 mg/dL — ABNORMAL HIGH (ref 65–99)
Potassium: 4.1 mmol/L (ref 3.5–5.1)
Sodium: 139 mmol/L (ref 135–145)
TOTAL PROTEIN: 7 g/dL (ref 6.5–8.1)

## 2015-08-29 LAB — CBC
HCT: 38.8 % — ABNORMAL LOW (ref 39.0–52.0)
Hemoglobin: 12.9 g/dL — ABNORMAL LOW (ref 13.0–17.0)
MCH: 29.7 pg (ref 26.0–34.0)
MCHC: 33.2 g/dL (ref 30.0–36.0)
MCV: 89.4 fL (ref 78.0–100.0)
PLATELETS: 214 10*3/uL (ref 150–400)
RBC: 4.34 MIL/uL (ref 4.22–5.81)
RDW: 12.6 % (ref 11.5–15.5)
WBC: 14.5 10*3/uL — ABNORMAL HIGH (ref 4.0–10.5)

## 2015-08-29 LAB — URINALYSIS, ROUTINE W REFLEX MICROSCOPIC
GLUCOSE, UA: NEGATIVE mg/dL
HGB URINE DIPSTICK: NEGATIVE
KETONES UR: NEGATIVE mg/dL
LEUKOCYTES UA: NEGATIVE
Nitrite: NEGATIVE
PH: 7 (ref 5.0–8.0)
PROTEIN: NEGATIVE mg/dL
Specific Gravity, Urine: 1.033 — ABNORMAL HIGH (ref 1.005–1.030)

## 2015-08-29 LAB — ETHANOL

## 2015-08-29 LAB — RAPID URINE DRUG SCREEN, HOSP PERFORMED
AMPHETAMINES: NOT DETECTED
Barbiturates: NOT DETECTED
Benzodiazepines: POSITIVE — AB
Cocaine: NOT DETECTED
OPIATES: NOT DETECTED
Tetrahydrocannabinol: POSITIVE — AB

## 2015-08-29 LAB — SALICYLATE LEVEL

## 2015-08-29 LAB — LACTIC ACID, PLASMA
LACTIC ACID, VENOUS: 2 mmol/L — AB (ref 0.5–1.9)
LACTIC ACID, VENOUS: 2.1 mmol/L — AB (ref 0.5–1.9)

## 2015-08-29 LAB — CBG MONITORING, ED: GLUCOSE-CAPILLARY: 127 mg/dL — AB (ref 65–99)

## 2015-08-29 LAB — ACETAMINOPHEN LEVEL: Acetaminophen (Tylenol), Serum: <10 ug/mL — ABNORMAL LOW (ref 10–30)

## 2015-08-29 MED ORDER — SODIUM CHLORIDE 0.9% FLUSH
3.0000 mL | Freq: Two times a day (BID) | INTRAVENOUS | Status: DC
  Administered 2015-08-29: 3 mL via INTRAVENOUS

## 2015-08-29 MED ORDER — FAMOTIDINE IN NACL 20-0.9 MG/50ML-% IV SOLN
20.0000 mg | Freq: Two times a day (BID) | INTRAVENOUS | Status: DC
  Administered 2015-08-29 – 2015-09-01 (×6): 20 mg via INTRAVENOUS
  Filled 2015-08-29 (×6): qty 50

## 2015-08-29 MED ORDER — SODIUM CHLORIDE 0.9 % IV BOLUS (SEPSIS)
1000.0000 mL | Freq: Once | INTRAVENOUS | Status: AC
  Administered 2015-08-29: 1000 mL via INTRAVENOUS

## 2015-08-29 MED ORDER — NICOTINE 21 MG/24HR TD PT24
21.0000 mg | MEDICATED_PATCH | Freq: Every day | TRANSDERMAL | Status: DC
Start: 2015-08-29 — End: 2015-09-02
  Administered 2015-08-29 – 2015-09-02 (×5): 21 mg via TRANSDERMAL
  Filled 2015-08-29 (×5): qty 1

## 2015-08-29 MED ORDER — NALOXONE HCL 0.4 MG/ML IJ SOLN
0.4000 mg | Freq: Once | INTRAMUSCULAR | Status: AC
  Administered 2015-08-29: 0.4 mg via INTRAVENOUS
  Filled 2015-08-29: qty 1

## 2015-08-29 MED ORDER — NALBUPHINE HCL 20 MG/ML IJ SOLN
5.0000 mg | INTRAMUSCULAR | Status: DC | PRN
  Administered 2015-08-29 – 2015-08-30 (×2): 5 mg via INTRAVENOUS
  Filled 2015-08-29: qty 0.25
  Filled 2015-08-29: qty 0.5
  Filled 2015-08-29 (×2): qty 0.25

## 2015-08-29 MED ORDER — ONDANSETRON HCL 4 MG/2ML IJ SOLN
4.0000 mg | Freq: Four times a day (QID) | INTRAMUSCULAR | Status: DC | PRN
  Administered 2015-08-30 – 2015-08-31 (×2): 4 mg via INTRAVENOUS
  Filled 2015-08-29 (×2): qty 2

## 2015-08-29 MED ORDER — ENOXAPARIN SODIUM 40 MG/0.4ML ~~LOC~~ SOLN
40.0000 mg | SUBCUTANEOUS | Status: DC
  Administered 2015-08-29 – 2015-09-01 (×4): 40 mg via SUBCUTANEOUS
  Filled 2015-08-29 (×5): qty 0.4

## 2015-08-29 MED ORDER — LORAZEPAM 2 MG/ML IJ SOLN
2.0000 mg | INTRAMUSCULAR | Status: DC | PRN
  Administered 2015-08-29 – 2015-08-31 (×3): 2 mg via INTRAVENOUS
  Filled 2015-08-29 (×3): qty 1

## 2015-08-29 MED ORDER — SODIUM CHLORIDE 0.9 % IV SOLN
INTRAVENOUS | Status: DC
  Administered 2015-08-29 – 2015-09-01 (×8): via INTRAVENOUS

## 2015-08-29 NOTE — ED Notes (Signed)
Pt placed on 2L [place] after O2 saturation of 90% on room air.

## 2015-08-29 NOTE — ED Provider Notes (Signed)
Patient presents to the emergency room with complaints of detoxing from a synthetic opiate that he is buying over the Internet.  4 fluorobutyrfentanyl.  Patient is also taking Suboxone. His last use of that drug was 7 PM last evening.  Patient states he did take 8 mg of Suboxone and 60 mg if etizolam.  Pt called EMS.   He feels like he is withdrawing.  Her is listless in the ED Physical Exam  BP 99/65 mmHg  Pulse 60  Temp(Src) 97.5 F (36.4 C) (Oral)  Resp 30  SpO2 90%  Physical Exam  Constitutional: No distress.  Ill appearing, appears older than age  HENT:  Head: Normocephalic and atraumatic.  Right Ear: External ear normal.  Left Ear: External ear normal.  Mouth/Throat: No oropharyngeal exudate.  Absent teeth, mm dry  Eyes: Conjunctivae are normal. Right eye exhibits no discharge. Left eye exhibits no discharge. No scleral icterus.  Neck: Neck supple. No tracheal deviation present.  Cardiovascular: Normal rate.   Pulmonary/Chest: Effort normal. No stridor. No respiratory distress.  Abdominal: He exhibits no distension. There is no tenderness. There is no rebound.  Musculoskeletal: He exhibits no edema.  Neurological: He is alert. No cranial nerve deficit (no gross deficits) or sensory deficit.  Generalized weakness, pt does follow commands  Skin: Skin is warm and dry. No rash noted.  Psychiatric: He has a normal mood and affect.  Nursing note and vitals reviewed.   ED Course  Procedures  EKG Interpretation  Date/Time:  Monday August 29 2015 15:52:54 EDT Ventricular Rate:  73 PR Interval:    QRS Duration: 96 QT Interval:  410 QTC Calculation: 452 R Axis:   86 Text Interpretation:  Sinus rhythm ST elev, probable normal early repol pattern No significant change since last tracing Confirmed by Drey Shaff  MD-J, Kynley Metzger ([id number]) on 08/29/2015 4:04:33 PM       MDM Pt appears much older than his age.  Suspect chronic issues from his substance abuse.  Sx may be related to his drug use  but concerned about possible infection as well.  Will hydrate, check labs, monitor closely.      Linwood DibblesJon Chelsi Warr, MD 08/29/15 [phone]

## 2015-08-29 NOTE — H&P (Signed)
History and Physical    [name] ZOX:[id number] DOB: 08-23-1980 DOA: 08/29/2015  PCP: Kaleen Mask, MD   Patient coming from: Home.  Chief Complaint: Altered mental status.  HPI: [name] is a 35 y.o. male with medical history significant of benzodiazepine abuse, opiate abuse, cannabis abuse, tobacco use disorder, depression, anxiety, PTSD, IBS who comes in emergency department via EMS after his girlfriend noticed that he was very lethargic. He also states that he has had 3 episodes of emesis and 4-5 episodes of diarrhea today from withdrawal symptoms.   Per patient, his lethargy began after taking 8 mg of Suboxone 3 hours while trying to detox from 4-Fluorobutyrfentanyl 89 mg, which he has been taking 3-4 times a day, took last yesterday evening around 19:00, and 60 mg of etizolam 5 hours prior to the beginning of symptoms. He has been taking the etizolam about 3 times a day. Both of these agents are designer drugs, which he has been obtaining from the Internet. He states that he would like to get into a detoxification program. He denies depressive symptoms, suicidal or homicidal ideas.  ED Course: The patient receive supplemental oxygen, IV fluid bolus and naloxone with improvement of his mental status. He states that he is feeling aches all over his body.  Review of Systems: As per HPI otherwise 10 point review of systems negative.   Past Medical History  Diagnosis Date  . Benzodiazepine abuse   . History of IBS   . Depression   . Anxiety   . Drug use     History reviewed. No pertinent past surgical history.   reports that he has been smoking Cigarettes.  He has been smoking about 0.50 packs per day. He does not have any smokeless tobacco history on file. He reports that he uses illicit drugs (Marijuana). He reports that he does not drink alcohol.  Allergies  Allergen Reactions  . Compazine [Prochlorperazine Edisylate] Other (See Comments)    Blood  pressure drops  . Nsaids Other (See Comments)    unknown  . Phenergan [Promethazine Hcl] Other (See Comments)    Blood pressure drops   . Tramadol Other (See Comments)    Blood pressure drops     History reviewed. No pertinent family history.   Prior to Admission medications   Medication Sig Start Date End Date Taking? Authorizing Provider  Buprenorphine HCl-Naloxone HCl (SUBOXONE) 12-3 MG FILM Place 1 strip under the tongue 2 (two) times daily.   Yes Historical Provider, MD  Investigational - Study Medication Take 60-200 mg by mouth daily. Etizolam.   Yes Historical Provider, MD  Investigational - Study Medication Take 89 mg by mouth daily. 4-Fluorobutyrfentanyl.   Yes Historical Provider, MD  methylphenidate (RITALIN) 20 MG tablet Take 60 mg by mouth daily.   Yes Historical Provider, MD    Physical Exam: Filed Vitals:   08/29/15 1800 08/29/15 1841 08/29/15 1901 08/29/15 1933  BP: 104/74  Pulse: 60 68 71 75  Temp:  97.5 F (36.4 C)    TempSrc:  Oral    Resp: 13 30 33 20  SpO2: 100% 99% 100% 100%      Constitutional: NAD, calm, comfortable Filed Vitals:   08/29/15 1800 08/29/15 1841 08/29/15 1901 08/29/15 1933  BP: 104/74  Pulse: 60 68 71 75  Temp:  97.5 F (36.4 C)    TempSrc:  Oral    Resp: 13 30 33 20  SpO2: 100% 99%  100% 100%   Eyes: PERRL, lids and conjunctivae normal ENMT: Mucous membranes are moist. Posterior pharynx clear of any exudate or lesions. Neck: normal, supple, no masses, no thyromegaly Respiratory: clear to auscultation bilaterally, no wheezing, no crackles. Normal respiratory effort. No accessory muscle use.  Cardiovascular: Regular rate and rhythm, no murmurs / rubs / gallops. No extremity edema. 2+ pedal pulses. No carotid bruits.  Abdomen: no tenderness, no masses palpated. No hepatosplenomegaly. Bowel sounds positive.  Musculoskeletal: no clubbing / cyanosis. No joint deformity upper and lower extremities.  Good ROM, no contractures. Normal muscle tone.  Skin: no rashes, lesions, ulcers. No induration Neurologic: CN 2-12 grossly intact. Sensation intact, DTR normal. Strength 5/5 in all 4.  Psychiatric: Normal judgment and insight. Alert and oriented x 4. Normal mood.     Labs on Admission: I have personally reviewed following labs and imaging studies  CBC:  Recent Labs Lab 08/29/15 1635  WBC 14.5*  HGB 12.9*  HCT 38.8*  MCV 89.4  PLT 214   Basic Metabolic Panel:  Recent Labs Lab 08/29/15 1635  NA 139  K 4.1  CL 107  CO2 24  GLUCOSE 147*  BUN 16  CREATININE 0.79  CALCIUM 8.1*   GFR: CrCl cannot be calculated (Unknown ideal weight.). Liver Function Tests:  Recent Labs Lab 08/29/15 1635  AST 22  ALT 17  ALKPHOS 88  BILITOT 0.5  PROT 7.0  ALBUMIN 3.6   CBG:  Recent Labs Lab 08/29/15 1559  GLUCAP 127*   Urine analysis:    Component Value Date/Time   COLORURINE AMBER* 08/29/2015 1820   APPEARANCEUR CLEAR 08/29/2015 1820   LABSPEC 1.033* 08/29/2015 1820   PHURINE 7.0 08/29/2015 1820   GLUCOSEU NEGATIVE 08/29/2015 1820   HGBUR NEGATIVE 08/29/2015 1820   BILIRUBINUR SMALL* 08/29/2015 1820   KETONESUR NEGATIVE 08/29/2015 1820   PROTEINUR NEGATIVE 08/29/2015 1820   UROBILINOGEN 1.0 07/25/2013 2227   NITRITE NEGATIVE 08/29/2015 1820   LEUKOCYTESUR NEGATIVE 08/29/2015 1820     Radiological Exams on Admission: Dg Chest 1 View  08/29/2015  CLINICAL DATA:  Not responding after vomiting today. Clinical concern for aspiration. Smoker. EXAM: CHEST 1 VIEW COMPARISON:  01/20/2015. FINDINGS: Normal sized heart.  Clear lungs.  Normal appearing bones. IMPRESSION: No acute abnormality. Electronically Signed   By: Beckie SaltsSteven  Reid M.D.   On: 08/29/2015 16:20    EKG: Independently reviewed. Vent. rate 73 BPM PR interval * ms QRS duration 96 ms QT/QTc 410/452 ms P-R-T axes 63 86 74 Sinus rhythm ST elev, probable normal early repol pattern No significant change since  previous EKG.  Assessment/Plan Principal Problem:   Opioid overdose Admit to telemetry/observation. Continue supplemental oxygen. Nubain 5mg  IVP as needed for pain. Clonidine 0.1 mg by mouth every 6 hours when necessary for opioid withdrawal symptoms Lorazepam 2 mg IVP in case of seizures or extreme restlessness secondary to etizolam withdrawal. Discharge to behavioral health for detoxification program in a.m.  Active Problems:   Leukocytosis Denies fever, cough, GU symptoms. The patient has had nausea and emesis, but states secondary to withdrawal symptoms. Monitor temperature closely and repeat CBC in the morning.    Anemia Check anemia panel.    Polysubstance abuse Per patient, he would like to be admitted into the detoxification program.    Tobacco use disorder Nicotine replacement therapy was ordered.   DVT prophylaxis: Lovenox. Code Status: Full code. Family Communication: His mother was present in the room. Disposition Plan: Overnight observation and discharge to detox in  a.m. Consults called:  Admission status: Observation/telemetry.   Bobette Mo MD [facility] Pager [phone].  If 7PM-7AM, please contact night-coverage [email or web address] Password [email or web address]  08/29/2015, 7:40 PM

## 2015-08-29 NOTE — ED Notes (Signed)
Patient vomited while this tech was in the room. RN and PA aware.

## 2015-08-29 NOTE — ED Notes (Signed)
Per EMS pt complaint of lethargy related to self detox from 4-Fluorobutyrfentanyl 89 mg 3-4 times a day for months; last dose 1900 yesterday. Took 8 mg suboxone 3 hours ago and 60 mg of etizolam 5 hours ago. Pt a/o x4 and denies pain.

## 2015-08-29 NOTE — ED Provider Notes (Signed)
CSN: [id number]     Arrival date & time 08/29/15  1501 History   First MD Initiated Contact with Patient 08/29/15 1541     Chief Complaint  Patient presents with  . Withdrawal     HPI Comments: 35 year old male presents with overdose. Patient's mother called EMS due to lethargy and unresponsiveness. He has been taking 4-Fluorobutyrfentanyl 89 mg 3-4 times a day for months. He has been buying it online - last dose 1900 yesterday. Once he ran out of this medicine he took 8 mg suboxone. Last dose was 3 hours ago. He has also been taking 60 mg of etizolam. Last dose was 5 hours ago. Patient has a hx of polysubstance abuse. He has been to rehab multiple times but has relapsed. Level 5 caveat due to mental status.   Past Medical History  Diagnosis Date  . Benzodiazepine abuse   . History of IBS   . Depression   . Anxiety   . Drug use    History reviewed. No pertinent past surgical history. No family history on file. Social History  Substance Use Topics  . Smoking status: Current Every Day Smoker -- 0.50 packs/day    Types: Cigarettes  . Smokeless tobacco: None  . Alcohol Use: No    Review of Systems  Unable to perform ROS: Mental status change      Allergies  Compazine; Nsaids; Phenergan; and Tramadol  Home Medications   Prior to Admission medications   Medication Sig Start Date End Date Taking? Authorizing Provider  Buprenorphine HCl-Naloxone HCl (SUBOXONE) 12-3 MG FILM Place 1 strip under the tongue 2 (two) times daily.    Historical Provider, MD  clonazePAM (KLONOPIN) 0.5 MG tablet Take 1 mg by mouth daily.    Historical Provider, MD  clotrimazole (MYCELEX) 10 MG troche Take 1 tablet (10 mg total) by mouth 5 (five) times daily. 01/20/15   Pricilla LovelessScott Goldston, MD  diphenhydrAMINE (BENADRYL) 25 mg capsule Take 50 mg by mouth at bedtime as needed for sleep.    Historical Provider, MD  levofloxacin (LEVAQUIN) 750 MG tablet Take 1 tablet (750 mg total) by mouth daily. X 5 days 01/20/15    Pricilla LovelessScott Goldston, MD  methylphenidate (RITALIN) 20 MG tablet Take 60 mg by mouth daily.    Historical Provider, MD   BP 99/65 mmHg  Pulse 60  Temp(Src) 97.5 F (36.4 C) (Oral)  Resp 30  SpO2 90%   Physical Exam  Constitutional: He appears lethargic. No distress.  Appears older than stated age  HENT:  Head: Normocephalic and atraumatic.  Mouth/Throat: Abnormal dentition.  Wears dentures  Eyes: Conjunctivae are normal. Pupils are equal, round, and reactive to light. Right eye exhibits no discharge. Left eye exhibits no discharge. No scleral icterus.  Sluggish pupils bilaterally; Dilated pupils  Neck: Normal range of motion.  Cardiovascular: Normal rate and regular rhythm.  Exam reveals no gallop and no friction rub.   No murmur heard. Pulses:      Radial pulses are 1+ on the right side, and 1+ on the left side.  Pulmonary/Chest: Effort normal. No accessory muscle usage. Tachypnea noted. No respiratory distress. He has decreased breath sounds. He has no wheezes. He has no rhonchi. He has no rales.  Shallow breaths; drooling  Abdominal: Soft. Bowel sounds are normal. He exhibits no distension and no mass. There is no tenderness. There is no rebound and no guarding.  Neurological: He appears lethargic. He is disoriented. GCS eye subscore is 3. GCS  verbal subscore is 2. GCS motor subscore is 4.  Skin: Skin is warm and dry. He is not diaphoretic.  Psychiatric: His speech is slurred.    ED Course  Procedures (including critical care time) Labs Review Labs Reviewed  COMPREHENSIVE METABOLIC PANEL - Abnormal; Notable for the following:    Glucose, Bld 147 (*)    Calcium 8.1 (*)    All other components within normal limits  ACETAMINOPHEN LEVEL - Abnormal; Notable for the following:    Acetaminophen (Tylenol), Serum <10 (*)    All other components within normal limits  CBC - Abnormal; Notable for the following:    WBC 14.5 (*)    Hemoglobin 12.9 (*)    HCT 38.8 (*)    All other  components within normal limits  URINE RAPID DRUG SCREEN, HOSP PERFORMED - Abnormal; Notable for the following:    Benzodiazepines POSITIVE (*)    Tetrahydrocannabinol POSITIVE (*)    All other components within normal limits  LACTIC ACID, PLASMA - Abnormal; Notable for the following:    Lactic Acid, Venous 2.0 (*)    All other components within normal limits  URINALYSIS, ROUTINE W REFLEX MICROSCOPIC (NOT AT [facility]) - Abnormal; Notable for the following:    Color, Urine AMBER (*)    Specific Gravity, Urine 1.033 (*)    Bilirubin Urine SMALL (*)    All other components within normal limits  CBG MONITORING, ED - Abnormal; Notable for the following:    Glucose-Capillary 127 (*)    All other components within normal limits  CULTURE, BLOOD (ROUTINE X 2)  CULTURE, BLOOD (ROUTINE X 2)  URINE CULTURE  ETHANOL  SALICYLATE LEVEL  LACTIC ACID, PLASMA    Imaging Review Dg Chest 1 View  08/29/2015  CLINICAL DATA:  Not responding after vomiting today. Clinical concern for aspiration. Smoker. EXAM: CHEST 1 VIEW COMPARISON:  01/20/2015. FINDINGS: Normal sized heart.  Clear lungs.  Normal appearing bones. IMPRESSION: No acute abnormality. Electronically Signed   By: Beckie Salts M.D.   On: 08/29/2015 16:20   I have personally reviewed and evaluated these images and lab results as part of my medical decision-making.   EKG Interpretation   Date/Time:  Monday August 29 2015 15:52:54 EDT Ventricular Rate:  73 PR Interval:    QRS Duration: 96 QT Interval:  410 QTC Calculation: 452 R Axis:   86 Text Interpretation:  Sinus rhythm ST elev, probable normal early repol  pattern No significant change since last tracing Confirmed by KNAPP  MD-J,  JON ([id number]) on 08/29/2015 4:04:33 PM Also confirmed by KNAPP  MD-J, JON  [phone]), editor [place] CT, Jola Babinski [phone])  on 08/29/2015 4:37:28 PM      MDM   Final diagnoses:  Overdose, accidental or unintentional, initial encounter  Hypoxia  Polysubstance abuse    35 year old male with long standing hx of polysubstance abuse presents with OD. He was initially hypoxic on arrival - placed on 2L O2. He is tachypneic and hypotensive. 500cc bolus given by EMS. 2 IVF bolus given in ED. Narcan given with minimal effect. He is afebrile and not tachycardic. CXR is negative. EKG is NSR with no significant change. Patient is arousable with sternal rub and appears to be handling secretions. CBC remarkable for leukocytosis of 14.4. CMP unremarkable. Lactic acid 2.0. UA is remarkable for high specific gravity. UDS remarkable for benzos and THS. Notably opiates are not on UDS however girlfriend states that the synthetic opiate has never historically appeared on  UDS. Shared visit with Dr. Lynelle Doctor. Spoke with Dr. Robb Matar who will admit for observation.     Bethel Born, PA-C 08/29/15 1944  Linwood Dibbles, MD 08/30/15 [phone]

## 2015-08-29 NOTE — ED Notes (Signed)
Phone report given to Sonya, RN 

## 2015-08-30 DIAGNOSIS — F1310 Sedative, hypnotic or anxiolytic abuse, uncomplicated: Secondary | ICD-10-CM | POA: Diagnosis present

## 2015-08-30 DIAGNOSIS — F1910 Other psychoactive substance abuse, uncomplicated: Secondary | ICD-10-CM

## 2015-08-30 DIAGNOSIS — R0682 Tachypnea, not elsewhere classified: Secondary | ICD-10-CM | POA: Diagnosis present

## 2015-08-30 DIAGNOSIS — T402X2A Poisoning by other opioids, intentional self-harm, initial encounter: Secondary | ICD-10-CM | POA: Diagnosis not present

## 2015-08-30 DIAGNOSIS — Z888 Allergy status to other drugs, medicaments and biological substances status: Secondary | ICD-10-CM | POA: Diagnosis not present

## 2015-08-30 DIAGNOSIS — L409 Psoriasis, unspecified: Secondary | ICD-10-CM | POA: Diagnosis present

## 2015-08-30 DIAGNOSIS — R4182 Altered mental status, unspecified: Secondary | ICD-10-CM | POA: Diagnosis present

## 2015-08-30 DIAGNOSIS — F419 Anxiety disorder, unspecified: Secondary | ICD-10-CM | POA: Diagnosis present

## 2015-08-30 DIAGNOSIS — Z765 Malingerer [conscious simulation]: Secondary | ICD-10-CM | POA: Diagnosis not present

## 2015-08-30 DIAGNOSIS — T402X1A Poisoning by other opioids, accidental (unintentional), initial encounter: Secondary | ICD-10-CM | POA: Diagnosis present

## 2015-08-30 DIAGNOSIS — Z886 Allergy status to analgesic agent status: Secondary | ICD-10-CM | POA: Diagnosis not present

## 2015-08-30 DIAGNOSIS — I959 Hypotension, unspecified: Secondary | ICD-10-CM | POA: Diagnosis present

## 2015-08-30 DIAGNOSIS — G92 Toxic encephalopathy: Secondary | ICD-10-CM | POA: Diagnosis present

## 2015-08-30 DIAGNOSIS — R531 Weakness: Secondary | ICD-10-CM | POA: Diagnosis not present

## 2015-08-30 DIAGNOSIS — F329 Major depressive disorder, single episode, unspecified: Secondary | ICD-10-CM | POA: Diagnosis present

## 2015-08-30 DIAGNOSIS — R0902 Hypoxemia: Secondary | ICD-10-CM | POA: Diagnosis present

## 2015-08-30 DIAGNOSIS — R000 Tachycardia, unspecified: Secondary | ICD-10-CM | POA: Diagnosis not present

## 2015-08-30 DIAGNOSIS — D649 Anemia, unspecified: Secondary | ICD-10-CM | POA: Diagnosis present

## 2015-08-30 DIAGNOSIS — Z79899 Other long term (current) drug therapy: Secondary | ICD-10-CM | POA: Diagnosis not present

## 2015-08-30 DIAGNOSIS — K589 Irritable bowel syndrome without diarrhea: Secondary | ICD-10-CM | POA: Diagnosis present

## 2015-08-30 DIAGNOSIS — F1123 Opioid dependence with withdrawal: Secondary | ICD-10-CM | POA: Diagnosis present

## 2015-08-30 DIAGNOSIS — F1210 Cannabis abuse, uncomplicated: Secondary | ICD-10-CM | POA: Diagnosis present

## 2015-08-30 DIAGNOSIS — D72829 Elevated white blood cell count, unspecified: Secondary | ICD-10-CM | POA: Diagnosis not present

## 2015-08-30 DIAGNOSIS — F4310 Post-traumatic stress disorder, unspecified: Secondary | ICD-10-CM | POA: Diagnosis present

## 2015-08-30 DIAGNOSIS — Z885 Allergy status to narcotic agent status: Secondary | ICD-10-CM | POA: Diagnosis not present

## 2015-08-30 DIAGNOSIS — F17210 Nicotine dependence, cigarettes, uncomplicated: Secondary | ICD-10-CM | POA: Diagnosis present

## 2015-08-30 DIAGNOSIS — F1193 Opioid use, unspecified with withdrawal: Secondary | ICD-10-CM | POA: Diagnosis not present

## 2015-08-30 DIAGNOSIS — T1491 Suicide attempt: Secondary | ICD-10-CM | POA: Diagnosis not present

## 2015-08-30 DIAGNOSIS — T50905A Adverse effect of unspecified drugs, medicaments and biological substances, initial encounter: Secondary | ICD-10-CM | POA: Diagnosis present

## 2015-08-30 DIAGNOSIS — T424X1A Poisoning by benzodiazepines, accidental (unintentional), initial encounter: Secondary | ICD-10-CM | POA: Diagnosis present

## 2015-08-30 LAB — RETICULOCYTES
RBC.: 4.06 MIL/uL — ABNORMAL LOW (ref 4.22–5.81)
RETIC COUNT ABSOLUTE: 44.7 10*3/uL (ref 19.0–186.0)
RETIC CT PCT: 1.1 % (ref 0.4–3.1)

## 2015-08-30 LAB — CBC WITH DIFFERENTIAL/PLATELET
BASOS ABS: 0 10*3/uL (ref 0.0–0.1)
Basophils Relative: 0 %
EOS PCT: 0 %
Eosinophils Absolute: 0 10*3/uL (ref 0.0–0.7)
HCT: 34.3 % — ABNORMAL LOW (ref 39.0–52.0)
Hemoglobin: 11.6 g/dL — ABNORMAL LOW (ref 13.0–17.0)
LYMPHS PCT: 13 %
Lymphs Abs: 1.8 10*3/uL (ref 0.7–4.0)
MCH: 30.1 pg (ref 26.0–34.0)
MCHC: 33.8 g/dL (ref 30.0–36.0)
MCV: 88.9 fL (ref 78.0–100.0)
MONO ABS: 0.7 10*3/uL (ref 0.1–1.0)
Monocytes Relative: 5 %
Neutro Abs: 11.6 10*3/uL — ABNORMAL HIGH (ref 1.7–7.7)
Neutrophils Relative %: 82 %
PLATELETS: 221 10*3/uL (ref 150–400)
RBC: 3.86 MIL/uL — ABNORMAL LOW (ref 4.22–5.81)
RDW: 12.7 % (ref 11.5–15.5)
WBC: 14.1 10*3/uL — ABNORMAL HIGH (ref 4.0–10.5)

## 2015-08-30 LAB — COMPREHENSIVE METABOLIC PANEL
ALT: 16 U/L — ABNORMAL LOW (ref 17–63)
ANION GAP: 7 (ref 5–15)
AST: 20 U/L (ref 15–41)
Albumin: 3.3 g/dL — ABNORMAL LOW (ref 3.5–5.0)
Alkaline Phosphatase: 84 U/L (ref 38–126)
BUN: 11 mg/dL (ref 6–20)
CHLORIDE: 106 mmol/L (ref 101–111)
CO2: 24 mmol/L (ref 22–32)
Calcium: 8 mg/dL — ABNORMAL LOW (ref 8.9–10.3)
Creatinine, Ser: 0.62 mg/dL (ref 0.61–1.24)
Glucose, Bld: 99 mg/dL (ref 65–99)
POTASSIUM: 3.8 mmol/L (ref 3.5–5.1)
Sodium: 137 mmol/L (ref 135–145)
Total Bilirubin: 0.2 mg/dL — ABNORMAL LOW (ref 0.3–1.2)
Total Protein: 6.6 g/dL (ref 6.5–8.1)

## 2015-08-30 LAB — IRON AND TIBC
IRON: 65 ug/dL (ref 45–182)
Saturation Ratios: 30 % (ref 17.9–39.5)
TIBC: 218 ug/dL — ABNORMAL LOW (ref 250–450)
UIBC: 153 ug/dL

## 2015-08-30 LAB — FERRITIN: FERRITIN: 81 ng/mL (ref 24–336)

## 2015-08-30 LAB — FOLATE: Folate: 19.8 ng/mL (ref >5.9)

## 2015-08-30 LAB — VITAMIN B12: VITAMIN B 12: 456 pg/mL (ref 180–914)

## 2015-08-30 MED ORDER — CLONIDINE HCL 0.1 MG PO TABS
0.1000 mg | ORAL_TABLET | Freq: Four times a day (QID) | ORAL | Status: DC | PRN
  Administered 2015-08-30 – 2015-09-01 (×3): 0.1 mg via ORAL
  Filled 2015-08-30 (×3): qty 1

## 2015-08-30 MED ORDER — CLONAZEPAM 0.5 MG PO TABS
0.5000 mg | ORAL_TABLET | Freq: Two times a day (BID) | ORAL | Status: DC
  Administered 2015-08-30 – 2015-09-02 (×7): 0.5 mg via ORAL
  Filled 2015-08-30 (×7): qty 1

## 2015-08-30 MED ORDER — BUPRENORPHINE HCL 2 MG SL SUBL
4.0000 mg | SUBLINGUAL_TABLET | Freq: Every day | SUBLINGUAL | Status: DC
  Administered 2015-08-30 – 2015-09-02 (×4): 4 mg via SUBLINGUAL
  Filled 2015-08-30 (×4): qty 2

## 2015-08-30 NOTE — Care Management Note (Signed)
Case Management Note  Patient Details  Name: [name] MRN: [id number] Date of Birth: 03/26/1980  Subjective/Objective:  35 y/o m admitted w/Toxic encephalopathy from opiod overdose. From home.                  Action/Plan:d/c plan home.   Expected Discharge Date:   (unknown)               Expected Discharge Plan:  Home/Self Care  In-House Referral:     Discharge planning Services  CM Consult  Post Acute Care Choice:    Choice offered to:     DME Arranged:    DME Agency:     HH Arranged:    HH Agency:     Status of Service:  In process, will continue to follow  If discussed at Long Length of Stay Meetings, dates discussed:    Additional Comments:  Lanier ClamMahabir, Emagene Merfeld, RN 08/30/2015, 2:19 PM

## 2015-08-30 NOTE — Progress Notes (Addendum)
PROGRESS NOTE                                                                                                                                                                                                             Patient Demographics:    [name], is a 35 y.o. male, DOB - 1980/06/11, AOZ:[id number]  Admit date - 08/29/2015   Admitting Physician Bobette Mo, MD  Outpatient Primary MD for the patient is Kaleen Mask, MD  LOS -   Chief Complaint  Patient presents with  . Withdrawal        Brief Narrative   AMMAN BARTEL is a 35 y.o. male with medical history significant of benzodiazepine abuse, opiate abuse, cannabis abuse, tobacco use disorder, depression, anxiety, PTSD, IBS who comes in emergency department via EMS after his girlfriend noticed that he was very lethargic. He also states that he has had 3 episodes of emesis and 4-5 episodes of diarrhea today from withdrawal symptoms.   Per patient, his lethargy began after taking 8 mg of Suboxone 3 hours while trying to detox from 4-Fluorobutyrfentanyl 89 mg, which he has been taking 3-4 times a day, took last yesterday evening around 19:00, and 60 mg of etizolam 5 hours prior to the beginning of symptoms. He has been taking the etizolam about 3 times a day. Both of these agents are designer drugs, which he has been obtaining from the Internet. He states that he would like to get into a detoxification program. He denies depressive symptoms, suicidal or homicidal ideas.  ED Course: The patient receive supplemental oxygen, IV fluid bolus and naloxone with improvement of his mental status. He states that he is feeling aches all over his body.    Subjective:    [name] today has, No headache, No chest pain, No abdominal pain - No Nausea, No new weakness tingling or numbness, No Cough - SOB.     Assessment  & Plan :     1.Toxic  encephalopathy due to Polypharmacy with unintentional narcotic and benzodiazepine overdose. He takes prescription Suboxone along with Internet obtained made in [place] 4-Fluorobutyrfentanyl & Etizolam - patient's mentation is improving, he has no headache or focal deficits, continue to get IV fluids along with home dose Suboxone and low-dose Klonopin to avoid abrupt withdrawal, currently no signs of withdrawal, social  worker requested to provide the patient with detox resources. Patient was extensively counseled not to use recreational medications.  2. History of underlying psoriasis with mild nonspecific leukocytosis. Stable, afebrile, chest x-ray & UA unimpressive. We will monitor.   Addendum - at 3.30 pm - after family visited the patient patient complained of Gen weakness and became non verbal, came to see the patient, highly inconsistent non focal exam, 2+ DTRs, avoided raised hands bilat x 5 each from hitting his face or head, as i was talking to the family that they will be asked to leave for couple of days as their presence seems to have precipitated his symptoms, he certainly started moving his hands and became verbal. Patient and mother both stated they don't want any further workup. Psych has already been consulted.    Family Communication  :  None present  Code Status :  Full  Diet : Heart Healthy  Disposition Plan  : home in am  Consults  :  S Work, Psych  Procedures  :     DVT Prophylaxis  :  Lovenox    Lab Results  Component Value Date   PLT 221 08/30/2015    Inpatient Medications  Scheduled Meds: . enoxaparin (LOVENOX) injection  40 mg Subcutaneous Q24H  . famotidine (PEPCID) IV  20 mg Intravenous Q12H  . nicotine  21 mg Transdermal Daily  . sodium chloride flush  3 mL Intravenous Q12H   Continuous Infusions: . sodium chloride 125 mL/hr at 08/30/15 0444   PRN Meds:.cloNIDine, LORazepam, nalbuphine, ondansetron (ZOFRAN) IV  Antibiotics  :    Anti-infectives     None         Objective:   Filed Vitals:   08/29/15 2007 08/29/15 2037 08/29/15 2101 08/30/15 0450  BP: 99/71 102/68 110/60 108/61  Pulse: 70 61 98 62  Temp:   98.6 F (37 C) 99.1 F (37.3 C)  TempSrc:   Oral Oral  Resp: 21 16 18 16   Height:   5\' 9"  (1.753 m)   Weight:   68 kg (149 lb 14.6 oz)   SpO2: 100% 100% 100% 99%    Wt Readings from Last 3 Encounters:  08/29/15 68 kg (149 lb 14.6 oz)  01/20/15 67.132 kg (148 lb)     Intake/Output Summary (Last 24 hours) at 08/30/15 1126 Last data filed at 08/30/15 0700  Gross per 24 hour  Intake 1281.25 ml  Output    675 ml  Net 606.25 ml     Physical Exam  Awake Alert, Oriented X 3, No new F.N deficits, Normal affect Springer.AT,PERRAL Supple Neck,No JVD, No cervical lymphadenopathy appriciated.  Symmetrical Chest wall movement, Good air movement bilaterally, CTAB RRR,No Gallops,Rubs or new Murmurs, No Parasternal Heave +ve B.Sounds, Abd Soft, No tenderness, No organomegaly appriciated, No rebound - guarding or rigidity. No Cyanosis, Clubbing or edema, No new Rash or bruise       Data Review:    CBC  Recent Labs Lab 08/29/15 1635 08/30/15 0513  WBC 14.5* 14.1*  HGB 12.9* 11.6*  HCT 38.8* 34.3*  PLT 214 221  MCV 89.4 88.9  MCH 29.7 30.1  MCHC 33.2 33.8  RDW 12.6 12.7  LYMPHSABS  --  1.8  MONOABS  --  0.7  EOSABS  --  0.0  BASOSABS  --  0.0    Chemistries   Recent Labs Lab 08/29/15 1635 08/30/15 0513  NA 139 137  K 4.1 3.8  CL 107 106  CO2 24  24  GLUCOSE 147* 99  BUN 16 11  CREATININE 0.79 0.62  CALCIUM 8.1* 8.0*  AST 22 20  ALT 17 16*  ALKPHOS 88 84  BILITOT 0.5 0.2*   ------------------------------------------------------------------------------------------------------------------ No results for input(s): CHOL, HDL, LDLCALC, TRIG, CHOLHDL, LDLDIRECT in the last 72 hours.  No results found for:  HGBA1C ------------------------------------------------------------------------------------------------------------------ No results for input(s): TSH, T4TOTAL, T3FREE, THYROIDAB in the last 72 hours.  Invalid input(s): FREET3 ------------------------------------------------------------------------------------------------------------------  Recent Labs  08/30/15 0513  VITAMINB12 456  FOLATE 19.8  FERRITIN 81  RETICCTPCT 1.1    Coagulation profile No results for input(s): INR, PROTIME in the last 168 hours.  No results for input(s): DDIMER in the last 72 hours.  Cardiac Enzymes No results for input(s): CKMB, TROPONINI, MYOGLOBIN in the last 168 hours.  Invalid input(s): CK ------------------------------------------------------------------------------------------------------------------ No results found for: BNP  Micro Results Recent Results (from the past 240 hour(s))  Blood culture (routine x 2)     Status: None (Preliminary result)   Collection Time: 08/29/15  7:15 PM  Result Value Ref Range Status   Specimen Description RIGHT ANTECUBITAL BLOOD  Final   Special Requests BOTTLES DRAWN AEROBIC AND ANAEROBIC 5CC  Final   Culture PENDING  Incomplete   Report Status PENDING  Incomplete  Blood culture (routine x 2)     Status: None (Preliminary result)   Collection Time: 08/29/15  7:21 PM  Result Value Ref Range Status   Specimen Description BLOOD RIGHT FOREARM  Final   Special Requests BOTTLES DRAWN AEROBIC AND ANAEROBIC 5CC  Final   Culture PENDING  Incomplete   Report Status PENDING  Incomplete    Radiology Reports Dg Chest 1 View  08/29/2015  CLINICAL DATA:  Not responding after vomiting today. Clinical concern for aspiration. Smoker. EXAM: CHEST 1 VIEW COMPARISON:  01/20/2015. FINDINGS: Normal sized heart.  Clear lungs.  Normal appearing bones. IMPRESSION: No acute abnormality. Electronically Signed   By: Beckie Salts M.D.   On: 08/29/2015 16:20    Time Spent in  minutes  30   Aoki Wedemeyer K M.D on 08/30/2015 at 11:26 AM  Between 7am to 7pm - Pager - [phone]  After 7pm go to [email or web address] - password [email or web address]  [facility] -  Office  [phone]

## 2015-08-30 NOTE — Progress Notes (Signed)
Patient and family called out from room. Pt assessed immediately, VS have remained stable 120/60, 80- Neuro assessment started, arms raised one at a time above pts head. Pt was able to avoid face and or head area. Provider is aware. Pt reports having feeling in all extremities but states he is unable to move them.

## 2015-08-31 DIAGNOSIS — D72829 Elevated white blood cell count, unspecified: Secondary | ICD-10-CM

## 2015-08-31 DIAGNOSIS — T1491 Suicide attempt: Secondary | ICD-10-CM

## 2015-08-31 LAB — URINE CULTURE: Culture: NO GROWTH

## 2015-08-31 NOTE — Consult Note (Signed)
[facility] Consult   Reason for Consult:  Polyubstance abuse and withdrawals from opioid and benzo's Referring Physician:  Dr. Candiss Norse Patient Identification: [name] MRN:  [id number] Principal Diagnosis: Opioid overdose Diagnosis:   Patient Active Problem List   Diagnosis Date Noted  . Opioid overdose [T40.2X1A] 08/29/2015  . Leukocytosis [D72.829] 08/29/2015  . Anemia [D64.9] 08/29/2015  . Polysubstance abuse [F19.10] 08/29/2015  . Tobacco use disorder [F17.200] 08/29/2015    Total Time spent with patient: 1 hour  Subjective:   [name] is a 35 y.o. male patient admitted with polysubstance abuse and depression  HPI:  [name] is a 35 y.o. male, Seen, chart reviewed and case discussed with the patient, his mother and his girlfriend who were at bedside. Patient reported he ran out of his drug supply and then started having withdrawal symptoms especially from his opiates and benzodiazepines. Patient mother called the sheriff department who transported him to the [facility] hospital. Patient is currently having craving for drugs and also withdrawal symptoms like nausea, vomiting, abdomen upset, disturbed sleep, appetite, decreased energy, feeling generalized body pains, aches and weakness. Patient has significant of benzodiazepine abuse, opiate abuse, cannabis abuse, tobacco use disorder, depression, anxiety, PTSD, IBS who comes in emergency department via EMS after his girlfriend noticed that he was very lethargic. He also states that he has had 3 episodes of emesis and 4-5 episodes of diarrhea today from withdrawal symptoms.  patient denies current suicidal/homicidal ideation, intention or plans. Patient stated he is extremely smart with the IQ urine of 138, graduated from [employer], has history of working in [place] and currently unemployed. Patient does not want to be his financial resources but endorses  using design drugs of benzodiazepines and opioids from online shopping from [place] over 4 years. He has a very high tolerance to drug of abuse reportedly can take fentanyl 89 mg a day and alprazolam 150 mg a day. Patient has no current legal charges.   Review the following for more details: Per patient,  his lethargy began after taking 8 mg of Suboxone 3 hours while trying to detox from 4-Fluorobutyrfentanyl 89 mg, which he has been taking 3-4 times a day, took last yesterday evening around 19:00, and 60 mg of etizolam 5 hours prior to the beginning of symptoms. He has been taking the etizolam about 3 times a day. Both of these agents are designer drugs, which he has been obtaining from the Internet. He states that he would like to get into a detoxification program. He denies depressive symptoms, suicidal or homicidal ideas  Past Psychiatric History: Polysubstance abuse sensation 35 years old and had multiple rehabilitation treatments and detox treatment.   Risk to Self: Is patient at risk for suicide?: No, but patient needs Medical Clearance Risk to Others:   Prior Inpatient Therapy:   Prior Outpatient Therapy:    Past Medical History:  Past Medical History  Diagnosis Date  . Benzodiazepine abuse   . History of IBS   . Depression   . Anxiety   . Drug use    History reviewed. No pertinent past surgical history. Family History: History reviewed. No pertinent family history. Family Psychiatric  History: significant for mental illness in the family and also drug of abuse. Patient mother has been suffering with bipolar disorder and was previously treated in behavioral [place].  Social History:  History  Alcohol Use No     History  Drug  Use  . Yes  . Special: Marijuana    Social History   Social History  . Marital Status: Single    Spouse Name: N/A  . Number of Children: N/A  . Years of Education: N/A   Social History Main Topics  . Smoking status: Current Every Day Smoker --  0.50 packs/day    Types: Cigarettes  . Smokeless tobacco: None  . Alcohol Use: No  . Drug Use: Yes    Special: Marijuana  . Sexual Activity: Not Asked   Other Topics Concern  . None   Social History Narrative   Additional Social History:    Allergies:   Allergies  Allergen Reactions  . Compazine [Prochlorperazine Edisylate] Other (See Comments)    Blood pressure drops  . Nsaids Other (See Comments)    unknown  . Phenergan [Promethazine Hcl] Other (See Comments)    Blood pressure drops   . Tramadol Other (See Comments)    Blood pressure drops     Labs:  Results for orders placed or performed during the hospital encounter of 08/29/15 (from the past 48 hour(s))  POC CBG, ED     Status: Abnormal   Collection Time: 08/29/15  3:59 PM  Result Value Ref Range   Glucose-Capillary 127 (H) 65 - 99 mg/dL  Comprehensive metabolic panel     Status: Abnormal   Collection Time: 08/29/15  4:35 PM  Result Value Ref Range   Sodium 139 135 - 145 mmol/L   Potassium 4.1 3.5 - 5.1 mmol/L   Chloride 107 101 - 111 mmol/L   CO2 24 22 - 32 mmol/L   Glucose, Bld 147 (H) 65 - 99 mg/dL   BUN 16 6 - 20 mg/dL   Creatinine, Ser 0.79 0.61 - 1.24 mg/dL   Calcium 8.1 (L) 8.9 - 10.3 mg/dL   Total Protein 7.0 6.5 - 8.1 g/dL   Albumin 3.6 3.5 - 5.0 g/dL   AST 22 15 - 41 U/L   ALT 17 17 - 63 U/L   Alkaline Phosphatase 88 38 - 126 U/L   Total Bilirubin 0.5 0.3 - 1.2 mg/dL   GFR calc non Af Amer >60 >60 mL/min   GFR calc Af Amer >60 >60 mL/min    Comment: (NOTE) The eGFR has been calculated using the CKD EPI equation. This calculation has not been validated in all clinical situations. eGFR's persistently <60 mL/min signify possible Chronic Kidney Disease.    Anion gap 8 5 - 15  Ethanol     Status: None   Collection Time: 08/29/15  4:35 PM  Result Value Ref Range   Alcohol, Ethyl (B) <5 <5 mg/dL    Comment:        LOWEST DETECTABLE LIMIT FOR SERUM ALCOHOL IS 5 mg/dL FOR MEDICAL PURPOSES  ONLY   Salicylate level     Status: None   Collection Time: 08/29/15  4:35 PM  Result Value Ref Range   Salicylate Lvl <4.9 2.8 - 30.0 mg/dL  Acetaminophen level     Status: Abnormal   Collection Time: 08/29/15  4:35 PM  Result Value Ref Range   Acetaminophen (Tylenol), Serum <10 (L) 10 - 30 ug/mL    Comment:        THERAPEUTIC CONCENTRATIONS VARY SIGNIFICANTLY. A RANGE OF 10-30 ug/mL MAY BE AN EFFECTIVE CONCENTRATION FOR MANY PATIENTS. HOWEVER, SOME ARE BEST TREATED AT CONCENTRATIONS OUTSIDE THIS RANGE. ACETAMINOPHEN CONCENTRATIONS >150 ug/mL AT 4 HOURS AFTER INGESTION AND >50 ug/mL AT 12 HOURS AFTER  INGESTION ARE OFTEN ASSOCIATED WITH TOXIC REACTIONS.   CBC     Status: Abnormal   Collection Time: 08/29/15  4:35 PM  Result Value Ref Range   WBC 14.5 (H) 4.0 - 10.5 K/uL   RBC 4.34 4.22 - 5.81 MIL/uL   Hemoglobin 12.9 (L) 13.0 - 17.0 g/dL   HCT 38.8 (L) 39.0 - 52.0 %   MCV 89.4 78.0 - 100.0 fL   MCH 29.7 26.0 - 34.0 pg   MCHC 33.2 30.0 - 36.0 g/dL   RDW 12.6 11.5 - 15.5 %   Platelets 214 150 - 400 K/uL  Lactic acid, plasma     Status: Abnormal   Collection Time: 08/29/15  4:35 PM  Result Value Ref Range   Lactic Acid, Venous 2.0 (HH) 0.5 - 1.9 mmol/L    Comment: CRITICAL RESULT CALLED TO, READ BACK BY AND VERIFIED WITH: L.MCCLURE AT 1724 ON 08/29/15 BY S.VANHOORNE   Urine culture     Status: None   Collection Time: 08/29/15  4:53 PM  Result Value Ref Range   Specimen Description URINE, CATHETERIZED    Special Requests NONE    Culture NO GROWTH Performed at [facility]     Report Status 08/31/2015 FINAL   Rapid urine drug screen (hospital performed)     Status: Abnormal   Collection Time: 08/29/15  6:00 PM  Result Value Ref Range   Opiates NONE DETECTED NONE DETECTED   Cocaine NONE DETECTED NONE DETECTED   Benzodiazepines POSITIVE (A) NONE DETECTED   Amphetamines NONE DETECTED NONE DETECTED   Tetrahydrocannabinol POSITIVE (A) NONE DETECTED    Barbiturates NONE DETECTED NONE DETECTED    Comment:        DRUG SCREEN FOR MEDICAL PURPOSES ONLY.  IF CONFIRMATION IS NEEDED FOR ANY PURPOSE, NOTIFY LAB WITHIN 5 DAYS.        LOWEST DETECTABLE LIMITS FOR URINE DRUG SCREEN Drug Class       Cutoff (ng/mL) Amphetamine      1000 Barbiturate      200 Benzodiazepine   150 Tricyclics       569 Opiates          300 Cocaine          300 THC              50   Urinalysis, Routine w reflex microscopic     Status: Abnormal   Collection Time: 08/29/15  6:20 PM  Result Value Ref Range   Color, Urine AMBER (A) YELLOW    Comment: BIOCHEMICALS MAY BE AFFECTED BY COLOR   APPearance CLEAR CLEAR   Specific Gravity, Urine 1.033 (H) 1.005 - 1.030   pH 7.0 5.0 - 8.0   Glucose, UA NEGATIVE NEGATIVE mg/dL   Hgb urine dipstick NEGATIVE NEGATIVE   Bilirubin Urine SMALL (A) NEGATIVE   Ketones, ur NEGATIVE NEGATIVE mg/dL   Protein, ur NEGATIVE NEGATIVE mg/dL   Nitrite NEGATIVE NEGATIVE   Leukocytes, UA NEGATIVE NEGATIVE    Comment: MICROSCOPIC NOT DONE ON URINES WITH NEGATIVE PROTEIN, BLOOD, LEUKOCYTES, NITRITE, OR GLUCOSE <1000 mg/dL.  Blood culture (routine x 2)     Status: None (Preliminary result)   Collection Time: 08/29/15  7:15 PM  Result Value Ref Range   Specimen Description RIGHT ANTECUBITAL BLOOD    Special Requests BOTTLES DRAWN AEROBIC AND ANAEROBIC 5CC    Culture      NO GROWTH < 24 HOURS Performed at [facility]    Report Status PENDING  Lactic acid, plasma     Status: Abnormal   Collection Time: 08/29/15  7:21 PM  Result Value Ref Range   Lactic Acid, Venous 2.1 (HH) 0.5 - 1.9 mmol/L    Comment: CRITICAL RESULT CALLED TO, READ BACK BY AND VERIFIED WITH: Annabell Howells [id number] @ [facility]   Blood culture (routine x 2)     Status: None (Preliminary result)   Collection Time: 08/29/15  7:21 PM  Result Value Ref Range   Specimen Description BLOOD RIGHT FOREARM    Special Requests BOTTLES DRAWN AEROBIC AND  ANAEROBIC 5CC    Culture      NO GROWTH < 24 HOURS Performed at [facility]    Report Status PENDING   CBC WITH DIFFERENTIAL     Status: Abnormal   Collection Time: 08/30/15  5:13 AM  Result Value Ref Range   WBC 14.1 (H) 4.0 - 10.5 K/uL   RBC 3.86 (L) 4.22 - 5.81 MIL/uL   Hemoglobin 11.6 (L) 13.0 - 17.0 g/dL   HCT 34.3 (L) 39.0 - 52.0 %   MCV 88.9 78.0 - 100.0 fL   MCH 30.1 26.0 - 34.0 pg   MCHC 33.8 30.0 - 36.0 g/dL   RDW 12.7 11.5 - 15.5 %   Platelets 221 150 - 400 K/uL   Neutrophils Relative % 82 %   Neutro Abs 11.6 (H) 1.7 - 7.7 K/uL   Lymphocytes Relative 13 %   Lymphs Abs 1.8 0.7 - 4.0 K/uL   Monocytes Relative 5 %   Monocytes Absolute 0.7 0.1 - 1.0 K/uL   Eosinophils Relative 0 %   Eosinophils Absolute 0.0 0.0 - 0.7 K/uL   Basophils Relative 0 %   Basophils Absolute 0.0 0.0 - 0.1 K/uL  Comprehensive metabolic panel     Status: Abnormal   Collection Time: 08/30/15  5:13 AM  Result Value Ref Range   Sodium 137 135 - 145 mmol/L   Potassium 3.8 3.5 - 5.1 mmol/L   Chloride 106 101 - 111 mmol/L   CO2 24 22 - 32 mmol/L   Glucose, Bld 99 65 - 99 mg/dL   BUN 11 6 - 20 mg/dL   Creatinine, Ser 0.62 0.61 - 1.24 mg/dL   Calcium 8.0 (L) 8.9 - 10.3 mg/dL   Total Protein 6.6 6.5 - 8.1 g/dL   Albumin 3.3 (L) 3.5 - 5.0 g/dL   AST 20 15 - 41 U/L   ALT 16 (L) 17 - 63 U/L   Alkaline Phosphatase 84 38 - 126 U/L   Total Bilirubin 0.2 (L) 0.3 - 1.2 mg/dL   GFR calc non Af Amer >60 >60 mL/min   GFR calc Af Amer >60 >60 mL/min    Comment: (NOTE) The eGFR has been calculated using the CKD EPI equation. This calculation has not been validated in all clinical situations. eGFR's persistently <60 mL/min signify possible Chronic Kidney Disease.    Anion gap 7 5 - 15  Vitamin B12     Status: None   Collection Time: 08/30/15  5:13 AM  Result Value Ref Range   Vitamin B-12 456 180 - 914 pg/mL    Comment: (NOTE) This assay is not validated for testing neonatal  or myeloproliferative syndrome specimens for Vitamin B12 levels. Performed at [facility]   Folate     Status: None   Collection Time: 08/30/15  5:13 AM  Result Value Ref Range   Folate 19.8 >5.9 ng/mL    Comment:  Performed at [facility]  Iron and TIBC     Status: Abnormal   Collection Time: 08/30/15  5:13 AM  Result Value Ref Range   Iron 65 45 - 182 ug/dL   TIBC 218 (L) 250 - 450 ug/dL   Saturation Ratios 30 17.9 - 39.5 %   UIBC 153 ug/dL    Comment: Performed at [facility]  Ferritin     Status: None   Collection Time: 08/30/15  5:13 AM  Result Value Ref Range   Ferritin 81 24 - 336 ng/mL    Comment: Performed at [facility]  Reticulocytes     Status: Abnormal   Collection Time: 08/30/15  5:13 AM  Result Value Ref Range   Retic Ct Pct 1.1 0.4 - 3.1 %   RBC. 4.06 (L) 4.22 - 5.81 MIL/uL   Retic Count, Manual 44.7 19.0 - 186.0 K/uL    Current Facility-Administered Medications  Medication Dose Route Frequency Provider Last Rate Last Dose  . 0.9 %  sodium chloride infusion   Intravenous Continuous Reubin Milan, MD 125 mL/hr at 08/31/15 0511    . buprenorphine (SUBUTEX) SL tablet 4 mg  4 mg Sublingual Daily Thurnell Lose, MD   4 mg at 08/30/15 1810  . clonazePAM (KLONOPIN) tablet 0.5 mg  0.5 mg Oral BID Thurnell Lose, MD   0.5 mg at 08/30/15 2100  . cloNIDine (CATAPRES) tablet 0.1 mg  0.1 mg Oral Q6H PRN Reubin Milan, MD   0.1 mg at 08/30/15 2100  . enoxaparin (LOVENOX) injection 40 mg  40 mg Subcutaneous Q24H Reubin Milan, MD   40 mg at 08/30/15 2100  . famotidine (PEPCID) IVPB 20 mg premix  20 mg Intravenous Q12H Reubin Milan, MD   20 mg at 08/30/15 2059  . LORazepam (ATIVAN) injection 2 mg  2 mg Intravenous Q4H PRN Reubin Milan, MD   2 mg at 08/31/15 0045  . nalbuphine (NUBAIN) 20 MG/ML injection 5 mg  5 mg Intravenous Q4H PRN Reubin Milan, MD   5 mg at 08/30/15 1222  . nicotine (NICODERM CQ - dosed in  mg/24 hours) patch 21 mg  21 mg Transdermal Daily Reubin Milan, MD   21 mg at 08/30/15 1148  . ondansetron (ZOFRAN) injection 4 mg  4 mg Intravenous Q6H PRN Reubin Milan, MD   4 mg at 08/30/15 2059  . sodium chloride flush (NS) 0.9 % injection 3 mL  3 mL Intravenous Q12H Reubin Milan, MD   3 mL at 08/29/15 2109    Musculoskeletal: Strength & Muscle Tone: decreased Gait & Station: unable to stand Patient leans: N/A  Psychiatric Specialty Exam: Physical Exam As per history and physical  ROS complaining about nausea, vomiting, generalized body pains, shaking intermittently, slurred speech and receiving oxygen by cannula. Patient denied current shortness of breath and chest pain. No Fever-chills, No Headache, No changes with Vision or hearing, reports vertigo No problems swallowing food or Liquids, No Chest pain, Cough or Shortness of Breath, No Abdominal pain, No Nausea or Vommitting, Bowel movements are regular, No Blood in stool or Urine, No dysuria, No new skin rashes or bruises, No new joints pains-aches,  No new weakness, tingling, numbness in any extremity, No recent weight gain or loss, No polyuria, polydypsia or polyphagia,   A full 10 point Review of Systems was done, except as stated above, all other Review of Systems were negative.   Blood  pressure 116/77, pulse 54, temperature 98 F (36.7 C), temperature source Oral, resp. rate 14, height 5' 9"  (1.753 m), weight 68 kg (149 lb 14.6 oz), SpO2 100 %.Body mass index is 22.13 kg/(m^2).  General Appearance: Disheveled and Guarded  Eye Contact:  Fair  Speech:  Slow and Slurred  Volume:  Decreased  Mood:  Anxious and Depressed  Affect:  Constricted and Depressed  Thought Process:  Coherent and Goal Directed  Orientation:  Full (Time, Place, and Person)  Thought Content:  Logical  Suicidal Thoughts:  No  Homicidal Thoughts:  No  Memory:  Immediate;   Fair Recent;   Fair Remote;   Fair  Judgement:   Impaired  Insight:  Fair  Psychomotor Activity:  Decreased and Restlessness  Concentration:  Concentration: Fair and Attention Span: Fair  Recall:  [employer] of Knowledge:  Good  Language:  Good  Akathisia:  Negative  Handed:  Right  AIMS (if indicated):     Assets:  Communication Skills Desire for Improvement Housing Intimacy Leisure Time Resilience Social Support Transportation  ADL's:  Impaired  Cognition:  WNL  Sleep:        Treatment Plan Summary: Daily contact with patient to assess and evaluate symptoms and progress in treatment and Medication management   Patient has been suffering with withdrawal symptoms of benzodiazepines and opiates and required detox treatment and also willing to participate in rehabilitation treatment when medically and psychiatrically stable.  Patient is also interested in referral to the methadone clinic as he cannot go back to Suboxone clinic in [place] any longer secondary to more frequent and increased polysubstance abuse As per history and physical.  Disposition: Recommend psychiatric Inpatient admission when medically cleared. Supportive therapy provided about ongoing stressors. Patient will be referred to the substance abuse rehabilitation center methadone clinic   Ambrose Finland, MD 08/31/2015 10:55 AM

## 2015-08-31 NOTE — Care Management Note (Signed)
Case Management Note  Patient Details  Name: [name] MRN: [id number] Date of Birth: 11/13/1980  Subjective/Objective:  Noted psych-recc inpt substance abuse rehab.Psych CSW following.                  Action/Plan:   Expected Discharge Date:   (unknown)               Expected Discharge Plan:  IP Rehab Facility  In-House Referral:     Discharge planning Services  CM Consult  Post Acute Care Choice:    Choice offered to:     DME Arranged:    DME Agency:     HH Arranged:    HH Agency:     Status of Service:  In process, will continue to follow  If discussed at Long Length of Stay Meetings, dates discussed:    Additional Comments:  Lanier ClamMahabir, Izrael Peak, RN 08/31/2015, 3:37 PM

## 2015-08-31 NOTE — Clinical Social Work Psych Assess (Addendum)
Clinical Social Work Nature conservation officer  Clinical Social Worker:  Lia Hopping, LCSW Date/Time:  08/31/2015, 4:10 PM Referred By:  Care Management Date Referred:  08/31/15 Reason for Referral:  Substance Abuse, Behavioral Health Issues   Presenting Symptoms/Problems  Presenting Symptoms/Problems(in person's/family's own words):  " I have been doing drugs all my life, since I was 15." Patient admitted with polysubstance abuse and depression.   Abuse/Neglect/Trauma History  Abuse/Neglect/Trauma History:  Witness to Trauma Abuse/Neglect/Trauma History Comments (indicate dates): Patient reports that has grandmother death in 04/05/10 was traumatic for him. She died of natural causes.    Psychiatric History  Psychiatric History:  Denies History Psychiatric Medication:  Patient has been prescribed Pysch medications before but has not been on them in years.    Current Mental Health Hospitalizations/Previous Mental Health History:  Patient last inpatient was seven years ago at [facility]. Patient does not recall any recent hospitlizations.   Current Provider:  Patient was using a private [facility] clinic about four years ago. Since then patient has been self medicating with opioids from online shopping.  Place and Date:  n/a  Current Medications:   Medications 09/01/15 09/02/15 09/03/15 09/04/15 09/05/15 09/06/15 09/07/15  buprenorphine (SUBUTEX) SL tablet 4 mg Dose: 4 mg Freq: Daily Route: SL Start: 08/30/15 1200   0919     1000     1000     1000     1000     1000     1000      clonazePAM (KLONOPIN) tablet 0.5 mg Dose: 0.5 mg Freq: 2 times daily Route: PO Start: 08/30/15 1200   0919    2200     1000    2200     1000    2200     1000    2200     1000    2200     1000    2200     1000    2200      enoxaparin (LOVENOX) injection 40 mg Dose: 40 mg Freq: Every 24 hours Route: [place] Start: 08/29/15 2200   Admin  Instructions:  Pharmacy may adjust. Do NOT expel air bubble from syringe before giving.   2200     2200     2200     2200     2200     2200     2200      famotidine (PEPCID) IVPB 20 mg premix Dose: 20 mg Freq: Every 12 hours Route: IV Start: 08/29/15 2200   0919    2200     1000    2200     1000    2200     1000    2200     1000    2200     1000    2200     1000    2200      nicotine (NICODERM CQ - dosed in mg/24 hours) patch 21 mg Dose: 21 mg Freq: Daily Route: TD Start: 08/29/15 2200   Admin Instructions:  Remove old patch before applying new patch   0919    0922     0922    1000     1000     1000     1000     1000     1000         Previous Inpatient Admission/Date/Reason: No recent.    Emotional Health/Current Symptoms  Suicide/Self Harm: None Reported  Suicide Attempt in Past (date/description):  Patient mother reports patient attempted suicide seven years ago after spending three months at [facility].   Other Harmful Behavior (ex. homicidal ideation) (describe): Patient denies homicidal ideations at this time.    Psychotic/Dissociative Symptoms  Psychotic/Dissociative Symptoms: None Reported Other Psychotic/Dissociative Symptoms:  Patient denies hallucinations and hearing voices.   Attention/Behavioral Symptoms  Attention/Behavioral Symptoms: Within Normal Limits Other Attention/Behavioral Symptoms:  Depressed, PTSD.    Cognitive Impairment  Cognitive Impairment:  Orientation - Situation, Orientation - Time, Orientation - Self, Orientation - Place, Within Normal Limits Other Cognitive Impairment:  Patient has slow speech and takes him time to process before speaking. Patient mother reports this is not his baseline he is outgoing, talkative and has a vibrant personality. Patient mother feels he depressed.   Mood and Adjustment  Mood and Adjustment:   (Calm and  Cooperative)   Stress, Anxiety, Trauma, Any Recent Loss/Stressor  Stress, Anxiety, Trauma, Any Recent Loss/Stressor: Flashbacks (Intrusive recollections of past traumatic events), Panic Attacks Anxiety (frequency):  Patient reports, "I do not know why I have anxiety."   Compulsive Behavior (specify):  Patient endorses using design drugs of benzodiazepzepines and opioids from online from [place] for over four years.     Other Stress, Anxiety, Trauma, Any Recent Loss/Stressor:  The lost of patient grandmother in 2012 onset PTSD. Patient reports stress due to inability to stop using opioids.    Substance Abuse/Use  Substance Abuse/Use: Current Substance Use, History of Substance Use, Substance Abuse Treatment Needed SBIRT Completed (please refer for detailed history): N/A Self-reported Substance Use (last use and frequency):  Patient reports, "the last time I used was several hours before being admitted to the ED.   Urinary Drug Screen Completed: Yes Alcohol Level:  0   Environment/Housing/Living Arrangement  Environmental/Housing/Living Arrangement: With Biological Parent(s) Who is in the Home:  Mother  Emergency Contact:  [phone]   Financial  Financial: Medicaid   Patient's Strengths and Goals  Patient's Strengths and Goals (patient's own words):  " I would like to find a methadone clinic". Patient is interested in finding a treatment facility.    Clinical Social Worker's Interpretive Summary  Clinical Social Workers Interpretive Summary: LCSWA met with patient at bedside, Patient mother present during second visit. Patient reports "I have been doing drugs since the age 35. I am depressed because I have ruined my life with drugs."Patient denies SI/HI ideations. Paitent reports having PTSD after the death of his grandmother that help take care of him. Patient reports having panic attacks as well. Patient report he graduated from [employer] and  having  jobs in the past but is currently unemployed. Patient expresses that his mother is his support and his GF of ten months. Patient reports he has had several rehabilitation and treatments for detox and treatment. Patient is voluntary for inpatient psych at this time.  Patient mother reports, "this is not my him he is vibrant and outgoing." He has cousins and uncles but I am the only family he is close to." Patient mother reports, pt. has struggled with SA treatment for years.  LCSWA provided patient mother with methadone clinic information to follow up with once patient is DC from inpatient [facility] and informed her SW at facility can assist patient as well.    Disposition  Disposition: Inpatient Referral Made ([facility]

## 2015-08-31 NOTE — Progress Notes (Signed)
PROGRESS NOTE                                                                                                                                                                                                             Patient Demographics:    [name], is a 35 y.o. male, DOB - 09/03/1980, ZOX:[id number]  Admit date - 08/29/2015   Admitting Physician Bobette Moavid Manuel Ortiz, MD  Outpatient Primary MD for the patient is Kaleen MaskELKINS,WILSON OLIVER, MD  LOS - 1  Chief Complaint  Patient presents with  . Withdrawal        Brief Narrative   Jordan PilonBrian A Chapman is a 35 y.o. male with medical history significant of benzodiazepine abuse, opiate abuse, cannabis abuse, tobacco use disorder, depression, anxiety, PTSD, IBS who comes in emergency department via EMS after his girlfriend noticed that he was very lethargic. He also states that he has had 3 episodes of emesis and 4-5 episodes of diarrhea today from withdrawal symptoms.   Per patient, his lethargy began after taking 8 mg of Suboxone 3 hours while trying to detox from 4-Fluorobutyrfentanyl 89 mg, which he has been taking 3-4 times a day, took last yesterday evening around 19:00, and 60 mg of etizolam 5 hours prior to the beginning of symptoms. He has been taking the etizolam about 3 times a day. Both of these agents are designer drugs, which he has been obtaining from the Internet. He states that he would like to get into a detoxification program. He denies depressive symptoms, suicidal or homicidal ideas.  ED Course: The patient receive supplemental oxygen, IV fluid bolus and naloxone with improvement of his mental status. He states that he is feeling aches all over his body.    Subjective:    Jordan CoppBrian Chapman today has, No headache, No chest pain, No abdominal pain - No Nausea, No new weakness tingling or numbness, No Cough - SOB.     Assessment  & Plan :     1.Toxic  encephalopathy due to Polypharmacy with unintentional narcotic and benzodiazepine overdose. He takes prescription Suboxone along with Internet obtained made in [place] 4-Fluorobutyrfentanyl & Etizolam - patient's mentation is improving, he has no headache or focal deficits, continue to get IV fluids along with home dose Suboxone and low-dose Klonopin to avoid abrupt withdrawal, currently no signs of withdrawal, social  worker requested to provide the patient with detox resources. Patient was extensively counseled not to use recreational medications.  2. History of underlying psoriasis with mild nonspecific leukocytosis. Stable, afebrile, chest x-ray & UA unimpressive. We will monitor.   3. Malingering symptoms. Patient had completed yesterday that he was unable to move extremities and had become nonverbal. Joneen Roach see my note from 08/30/2015, after extensive examination and counseling he started moving his extremities and became verbal, now close to baseline again, family which includes her girlfriend and mother educated bedside both on 08/30/2015 and 09/08/2015. Psych has been consulted.   Family Communication  :  None present  Code Status :  Full  Diet : Heart Healthy  Disposition Plan  : Likely discharge on 09/01/2015  Consults  :  S Work, Psych  Procedures  :     DVT Prophylaxis  :  Lovenox    Lab Results  Component Value Date   PLT 221 08/30/2015    Inpatient Medications  Scheduled Meds: . buprenorphine  4 mg Sublingual Daily  . clonazePAM  0.5 mg Oral BID  . enoxaparin (LOVENOX) injection  40 mg Subcutaneous Q24H  . famotidine (PEPCID) IV  20 mg Intravenous Q12H  . nicotine  21 mg Transdermal Daily  . sodium chloride flush  3 mL Intravenous Q12H   Continuous Infusions: . sodium chloride 125 mL/hr at 08/31/15 0511   PRN Meds:.cloNIDine, LORazepam, nalbuphine, ondansetron (ZOFRAN) IV  Antibiotics  :    Anti-infectives    None         Objective:   Filed Vitals:    08/30/15 1441 08/30/15 1839 08/30/15 2155 08/31/15 0550  BP: 122/67 114/66 114/69 116/77  Pulse: 73 70 63 54  Temp:  98.7 F (37.1 C) 98.2 F (36.8 C) 98 F (36.7 C)  TempSrc:  Oral Oral Oral  Resp: 16 16 14 14   Height:      Weight:      SpO2: 99% 99% 100% 100%    Wt Readings from Last 3 Encounters:  08/29/15 68 kg (149 lb 14.6 oz)  01/20/15 67.132 kg (148 lb)     Intake/Output Summary (Last 24 hours) at 08/31/15 1145 Last data filed at 08/31/15 0656  Gross per 24 hour  Intake 3091.67 ml  Output   1625 ml  Net 1466.67 ml     Physical Exam  Awake Alert, Oriented X 3, No new F.N deficits, Normal affect .AT,PERRAL Supple Neck,No JVD, No cervical lymphadenopathy appriciated.  Symmetrical Chest wall movement, Good air movement bilaterally, CTAB RRR,No Gallops,Rubs or new Murmurs, No Parasternal Heave +ve B.Sounds, Abd Soft, No tenderness, No organomegaly appriciated, No rebound - guarding or rigidity. No Cyanosis, Clubbing or edema, No new Rash or bruise       Data Review:    CBC  Recent Labs Lab 08/29/15 1635 08/30/15 0513  WBC 14.5* 14.1*  HGB 12.9* 11.6*  HCT 38.8* 34.3*  PLT 214 221  MCV 89.4 88.9  MCH 29.7 30.1  MCHC 33.2 33.8  RDW 12.6 12.7  LYMPHSABS  --  1.8  MONOABS  --  0.7  EOSABS  --  0.0  BASOSABS  --  0.0    Chemistries   Recent Labs Lab 08/29/15 1635 08/30/15 0513  NA 139 137  K 4.1 3.8  CL 107 106  CO2 24 24  GLUCOSE 147* 99  BUN 16 11  CREATININE 0.79 0.62  CALCIUM 8.1* 8.0*  AST 22 20  ALT 17 16*  ALKPHOS  88 84  BILITOT 0.5 0.2*   ------------------------------------------------------------------------------------------------------------------ No results for input(s): CHOL, HDL, LDLCALC, TRIG, CHOLHDL, LDLDIRECT in the last 72 hours.  No results found for: HGBA1C ------------------------------------------------------------------------------------------------------------------ No results for input(s): TSH, T4TOTAL,  T3FREE, THYROIDAB in the last 72 hours.  Invalid input(s): FREET3 ------------------------------------------------------------------------------------------------------------------  Recent Labs  08/30/15 0513  VITAMINB12 456  FOLATE 19.8  FERRITIN 81  TIBC 218*  IRON 65  RETICCTPCT 1.1    Coagulation profile No results for input(s): INR, PROTIME in the last 168 hours.  No results for input(s): DDIMER in the last 72 hours.  Cardiac Enzymes No results for input(s): CKMB, TROPONINI, MYOGLOBIN in the last 168 hours.  Invalid input(s): CK ------------------------------------------------------------------------------------------------------------------ No results found for: BNP  Micro Results Recent Results (from the past 240 hour(s))  Urine culture     Status: None   Collection Time: 08/29/15  4:53 PM  Result Value Ref Range Status   Specimen Description URINE, CATHETERIZED  Final   Special Requests NONE  Final   Culture NO GROWTH Performed at [facility]   Final   Report Status 08/31/2015 FINAL  Final  Blood culture (routine x 2)     Status: None (Preliminary result)   Collection Time: 08/29/15  7:15 PM  Result Value Ref Range Status   Specimen Description RIGHT ANTECUBITAL BLOOD  Final   Special Requests BOTTLES DRAWN AEROBIC AND ANAEROBIC 5CC  Final   Culture   Final    NO GROWTH < 24 HOURS Performed at [facility]    Report Status PENDING  Incomplete  Blood culture (routine x 2)     Status: None (Preliminary result)   Collection Time: 08/29/15  7:21 PM  Result Value Ref Range Status   Specimen Description BLOOD RIGHT FOREARM  Final   Special Requests BOTTLES DRAWN AEROBIC AND ANAEROBIC 5CC  Final   Culture   Final    NO GROWTH < 24 HOURS Performed at [facility]    Report Status PENDING  Incomplete    Radiology Reports Dg Chest 1 View  08/29/2015  CLINICAL DATA:  Not responding after vomiting today. Clinical concern for aspiration.  Smoker. EXAM: CHEST 1 VIEW COMPARISON:  01/20/2015. FINDINGS: Normal sized heart.  Clear lungs.  Normal appearing bones. IMPRESSION: No acute abnormality. Electronically Signed   By: Beckie Salts M.D.   On: 08/29/2015 16:20    Time Spent in minutes  30   Donnella Morford K M.D on 08/31/2015 at 11:45 AM  Between 7am to 7pm - Pager - [phone]  After 7pm go to [email or web address] - password [facility] [place]  [facility] -  Office  [phone]

## 2015-08-31 NOTE — Progress Notes (Addendum)
Update: LCSWA aware of consult. Psych MD recommends inpatient Behavioral Health at Discharge.  Once pt. Is medically stable [facility]. LCSWA aware of consult. Will follow up with patient with resources for inpatient and outpatient SA clinics.

## 2015-09-01 MED ORDER — CLONAZEPAM 0.5 MG PO TABS: 0.5000 mg | ORAL_TABLET | Freq: Two times a day (BID) | ORAL | Status: DC

## 2015-09-01 MED ORDER — LORAZEPAM 2 MG/ML IJ SOLN: 0.5000 mg | Freq: Four times a day (QID) | INTRAMUSCULAR | Status: DC | PRN

## 2015-09-01 MED ORDER — LORAZEPAM 0.5 MG PO TABS
0.5000 mg | ORAL_TABLET | Freq: Once | ORAL | Status: AC
  Administered 2015-09-01: 0.5 mg via ORAL
  Filled 2015-09-01: qty 1

## 2015-09-01 NOTE — Progress Notes (Signed)
LCSWA followed up with patient mother and inform her about patient status.  LCSWA will continue to assist with Disposition.

## 2015-09-01 NOTE — Plan of Care (Signed)
Problem: Nutrition: Goal: Adequate nutrition will be maintained Outcome: Progressing Pt drank wild berry breeze this evening, placed a second at bedside for him to drink during the night.

## 2015-09-01 NOTE — Consult Note (Signed)
[facility]   Reason for Consult:  Polyubstance abuse and withdrawals from opioid and benzo's Referring Physician:  Dr. Thedore MinsSingh Patient Identification: Jordan PilonBrian A Chapman MRN:  [id number] Principal Diagnosis: Opioid overdose Diagnosis:   Patient Active Problem List   Diagnosis Date Noted  . Opioid overdose [T40.2X1A] 08/29/2015  . Leukocytosis [D72.829] 08/29/2015  . Anemia [D64.9] 08/29/2015  . Polysubstance abuse [F19.10] 08/29/2015  . Tobacco use disorder [F17.200] 08/29/2015    Total Time spent with patient: 1 hour  Subjective:   Jordan PilonBrian A Chapman is a 35 y.o. male patient admitted with polysubstance abuse and depression  HPI:  Jordan PilonBrian A Chapman is a 35 y.o. male, Seen, chart reviewed and case discussed with the patient, his mother and his girlfriend who were at bedside. Patient reported he ran out of his drug supply and then started having withdrawal symptoms especially from his opiates and benzodiazepines. Patient mother called the sheriff department who [facility]. Patient is currently having craving for drugs and also withdrawal symptoms like nausea, vomiting, abdomen upset, disturbed sleep, appetite, decreased energy, feeling generalized body pains, aches and weakness.   Patient has significant of benzodiazepine abuse, opiate abuse, cannabis abuse, tobacco use disorder, depression, anxiety, PTSD, IBS who comes in emergency department via EMS after his girlfriend noticed that he was very lethargic. He also states that he has had 3 episodes of emesis and 4-5 episodes of diarrhea today from withdrawal symptoms.  patient denies current suicidal/homicidal ideation, intention or plans. 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 Patient does not want to be his financial resources but  endorses using design drugs of benzodiazepines and opioids from online shopping from [name] over 4 years. He has a very high tolerance to drug of abuse reportedly can take fentanyl 89 mg a day and alprazolam 150 mg a day. Patient has no current legal charges.   Review the following for more details: Per patient,  his lethargy began after taking 8 mg of Suboxone 3 hours while trying to detox from 4-Fluorobutyrfentanyl 89 mg, which he has been taking 3-4 times a day, took last yesterday evening around 19:00, and 60 mg of etizolam 5 hours prior to the beginning of symptoms. He has been taking the etizolam about 3 times a day. Both of these agents are designer drugs, which he has been obtaining from the Internet. He states that he would like to get into a detoxification program. He denies depressive symptoms, suicidal or homicidal ideas  Past Psychiatric History: Polysubstance abuse since age 89 years old and had multiple rehabilitation treatments and detox treatment.   09/01/2015. Interval History: Patient appeared lying on his bed, reports his withdrawal symptoms are getting worse. Patient denied having psychological withdrawal and he does not appeared suffering with physical symptoms of withdrawal. He has no evidence of psychosis, agitation or aggressive behaviors. He denied suicide or homicide ideation, intention or plans. Patient is seeking regular opioid because he can not access his designer drugs of abuse. He is extremely knowledgeable about his drug of abuse and manipulative.   Risk to Self: Is patient at risk for suicide?: No, but patient needs Medical Clearance Risk to Others:   Prior Inpatient Therapy:   Prior Outpatient Therapy:    Past Medical History:  Past Medical History  Diagnosis Date  . Benzodiazepine abuse   .  History of IBS   . Depression   . Anxiety   . Drug use    History reviewed. No pertinent past surgical history. Family History: History reviewed. No pertinent family  history. Family Psychiatric  History: significant for mental illness in the family and also drug of abuse. Patient mother has been suffering with bipolar disorder and was previously treated in behavioral [facility].  Social History:  History  Alcohol Use No     History  Drug Use  . Yes  . Special: Marijuana    Social History   Social History  . Marital Status: Single    Spouse Name: N/A  . Number of Children: N/A  . Years of Education: N/A   Social History Main Topics  . Smoking status: Current Every Day Smoker -- 0.50 packs/day    Types: Cigarettes  . Smokeless tobacco: None  . Alcohol Use: No  . Drug Use: Yes    Special: Marijuana  . Sexual Activity: Not Asked   Other Topics Concern  . None   Social History Narrative   Additional Social History:    Allergies:   Allergies  Allergen Reactions  . Compazine [Prochlorperazine Edisylate] Other (See Comments)    Blood pressure drops  . Nsaids Other (See Comments)    unknown  . Phenergan [Promethazine Hcl] Other (See Comments)    Blood pressure drops   . Tramadol Other (See Comments)    Blood pressure drops     Labs:  No results found for this or any previous visit (from the past 48 hour(s)).  Current Facility-Administered Medications  Medication Dose Route Frequency Provider Last Rate Last Dose  . buprenorphine (SUBUTEX) SL tablet 4 mg  4 mg Sublingual Daily Leroy Sea, MD   4 mg at 09/01/15 0919  . clonazePAM (KLONOPIN) tablet 0.5 mg  0.5 mg Oral BID Leroy Sea, MD   0.5 mg at 09/01/15 0919  . cloNIDine (CATAPRES) tablet 0.1 mg  0.1 mg Oral Q6H PRN Bobette Mo, MD   0.1 mg at 08/31/15 1154  . enoxaparin (LOVENOX) injection 40 mg  40 mg Subcutaneous Q24H Bobette Mo, MD   40 mg at 08/31/15 2153  . LORazepam (ATIVAN) injection 0.5 mg  0.5 mg Intravenous Q6H PRN Leroy Sea, MD      . nicotine (NICODERM CQ - dosed in mg/24 hours) patch 21 mg  21 mg Transdermal Daily Bobette Mo, MD   21 mg at 09/01/15 1610  . ondansetron (ZOFRAN) injection 4 mg  4 mg Intravenous Q6H PRN Bobette Mo, MD   4 mg at 08/31/15 1743    Musculoskeletal: Strength & Muscle Tone: decreased Gait & Station: unable to stand Patient leans: N/A  Psychiatric Specialty Exam: Physical Exam As per history and physical  ROS complaining about nausea, vomiting, generalized body pains, shaking intermittently, slurred speech and receiving oxygen by cannula. Patient denied current shortness of breath and chest pain. No Fever-chills, No Headache, No changes with Vision or hearing, reports vertigo No problems swallowing food or Liquids, No Chest pain, Cough or Shortness of Breath, No Abdominal pain, No Nausea or Vommitting, Bowel movements are regular, No Blood in stool or Urine, No dysuria, No new skin rashes or bruises, No new joints pains-aches,  No new weakness, tingling, numbness in any extremity, No recent weight gain or loss, No polyuria, polydypsia or polyphagia,   A full 10 point Review of Systems was done, except as stated above, all  other Review of Systems were negative.   Blood pressure 129/81, pulse 60, temperature 98.6 F (37 C), temperature source Oral, resp. rate 16, height  (1.753 m), weight 68 kg (149 lb 14.6 oz), SpO2 98 %.Body mass index is 22.13 kg/(m^2).  General Appearance: Disheveled and Guarded  Eye Contact:  Fair  Speech:  Slow and Slurred  Volume:  Decreased  Mood:  Anxious and Depressed  Affect:  Constricted and Depressed  Thought Process:  Coherent and Goal Directed  Orientation:  Full (Time, Place, and Person)  Thought Content:  Logical  Suicidal Thoughts:  No  Homicidal Thoughts:  No  Memory:  Immediate;   Fair Recent;   Fair Remote;   Fair  Judgement:  Impaired  Insight:  Fair  Psychomotor Activity:  Decreased and Restlessness  Concentration:  Concentration: Fair and Attention Span: Fair  Recall:  [employer] of Knowledge:  Good   Language:  Good  Akathisia:  Negative  Handed:  Right  AIMS (if indicated):     Assets:  Communication Skills Desire for Improvement Housing Intimacy Leisure Time Resilience Social Support Transportation  ADL's:  Impaired  Cognition:  WNL  Sleep:        Treatment Plan Summary: Psych CSW has been working on placement needs and so far no program accepting due to several issues Patient has been suffering with withdrawal symptoms of benzodiazepines and opiates and required detox treatment and also willing to participate in rehabilitation treatment when medically and psychiatrically stable.  Patient is also interested in referral to the methadone clinic as he cannot go back to Suboxone clinic in [place] any longer secondary to more frequent and increased polysubstance abuse as per history and physical.  Disposition: Recommend psychiatric Inpatient admission when medically cleared. Supportive therapy provided about ongoing stressors. Patient will be referred to the substance abuse rehabilitation center and methadone clinic   Leata Mouse, MD 09/01/2015 1:54 PM

## 2015-09-01 NOTE — Care Management Note (Signed)
Case Management Note  Patient Details  Name: [name] MRN: [id number] Date of Birth: 10/01/1980  Subjective/Objective: Psych CSW aware [facility].                   Action/Plan:d/c [facility].   Expected Discharge Date:   (unknown)               Expected Discharge Plan:  Psychiatric Hospital  In-House Referral:     Discharge planning Services  CM Consult  Post Acute Care Choice:    Choice offered to:     DME Arranged:    DME Agency:     HH Arranged:    HH Agency:     Status of Service:  Completed, signed off  If discussed at [employer] Length of Stay Meetings, dates discussed:    Additional Comments:  Lanier ClamMahabir, Julez Huseby, RN 09/01/2015, 9:29 AM

## 2015-09-01 NOTE — Progress Notes (Addendum)
LCSWA made referrals for [facility]:  [facility]: Pt. Denied due to continued treatment of Subutex. Per RN they do not provide it.  [facility]: Full [facility]:Full [facility]: No Adult medicaid  LCSWA will continue to assist with disposition to [facility].  Vivi BarrackNicole Keyshawna Prouse, Theresia MajorsLCSWA, MSW Clinical Social Worker 5E and Psychiatric Service Line [phone] 09/01/2015  10:58 AM

## 2015-09-01 NOTE — Discharge Instructions (Signed)
Follow with Primary MD Kaleen MaskELKINS,WILSON OLIVER, MD and your Psychiatrist in 2-3 days   Get CBC, CMP, 2 view Chest X ray checked  by Primary MD or SNF MD in 5-7 days ( we routinely change or add medications that can affect your baseline labs and fluid status, therefore we recommend that you get the mentioned basic workup next visit with your PCP, your PCP may decide not to get them or add new tests based on their clinical decision)   Activity: As tolerated with Full fall precautions use walker/cane & assistance as needed   Disposition [facility]   Diet:   Heart Healthy     On your next visit with your primary care physician please Get Medicines reviewed and adjusted.   Please request your Prim.MD to go over all Hospital Tests and Procedure/Radiological results at the follow up, please get all Hospital records sent to your Prim MD by signing hospital release before you go home.   If you experience worsening of your admission symptoms, develop shortness of breath, life threatening emergency, suicidal or homicidal thoughts you must seek medical attention immediately by calling 911 or calling your MD immediately  if symptoms less severe.  You Must read complete instructions/literature along with all the possible adverse reactions/side effects for all the Medicines you take and that have been prescribed to you. Take any new Medicines after you have completely understood and accpet all the possible adverse reactions/side effects.   Do not drive, operate heavy machinery, perform activities at heights, swimming or participation in water activities or provide baby sitting services if your were admitted for syncope or siezures until you have seen by Primary MD or a Neurologist and advised to do so again.  Do not drive when taking Pain medications.    Do not take more than prescribed Pain, Sleep and Anxiety Medications  Special Instructions: If you have smoked or chewed Tobacco  in the last 2 yrs please stop  smoking, stop any regular Alcohol  and or any Recreational drug use.  Wear Seat belts while driving.   Please note  You were cared for by a hospitalist during your hospital stay. If you have any questions about your discharge medications or the care you received while you were in the hospital after you are discharged, you can call the unit and asked to speak with the hospitalist on call if the hospitalist that took care of you is not available. Once you are discharged, your primary care physician will handle any further medical issues. Please note that NO REFILLS for any discharge medications will be authorized once you are discharged, as it is imperative that you return to your primary care physician (or establish a relationship with a primary care physician if you do not have one) for your aftercare needs so that they can reassess your need for medications and monitor your lab values.

## 2015-09-01 NOTE — Discharge Summary (Addendum)
[name] ZOX:[id number] DOB: 1981/02/17 DOA: 08/29/2015  PCP: Kaleen Mask, MD  Admit date: 08/29/2015  Discharge date: 09/01/2015  Admitted From: Home   Disposition:  Home per psych   Recommendations for Outpatient Follow-up:   Follow up with PCP in 1-2 weeks  PCP Please obtain BMP/CBC, 2 view CXR in 1week,  (see Discharge instructions)   PCP Please follow up on the following pending results: None   Home Health: None  Equipment/Devices: None   Discharge Condition: Fair    CODE STATUS: Full  Diet Recommendation: Heart Healthy Consultations: Psych  Chief Complaint  Patient presents with  . Withdrawal      Brief history of present illness from the day of admission and additional interim summary    [name] is a 35 y.o. male with medical history significant of benzodiazepine abuse, opiate abuse, cannabis abuse, tobacco use disorder, depression, anxiety, PTSD, IBS who comes in emergency department via EMS after his girlfriend noticed that he was very lethargic. He also states that he has had 3 episodes of emesis and 4-5 episodes of diarrhea today from withdrawal symptoms.   Per patient, his lethargy began after taking 8 mg of Suboxone 3 hours while trying to detox from 4-Fluorobutyrfentanyl 89 mg, which he has been taking 3-4 times a day, took last yesterday evening around 19:00, and 60 mg of etizolam 5 hours prior to the beginning of symptoms. He has been taking the etizolam about 3 times a day. Both of these agents are designer drugs, which he has been obtaining from the Internet. He states that he would like to get into a detoxification program. He denies depressive symptoms, suicidal or homicidal ideas.  ED Course: The patient receive supplemental oxygen, IV fluid bolus and naloxone with  improvement of his mental status. He states that he is feeling aches all over his body.  Hospital issues addressed    1.Toxic encephalopathy due to Polypharmacy with unintentional narcotic and benzodiazepine overdose. He takes prescription Suboxone along with Internet obtained made in [place] 4-Fluorobutyrfentanyl & Etizolam - patient's mentation is improving, he has no headache or focal deficits, got IV fluids along with home dose Suboxone and low-dose Klonopin to avoid abrupt withdrawal, currently no signs of withdrawal, social worker requested to provide the patient with detox resources. Patient was extensively counseled not to use recreational medications. Seen by Psych now DC to home per Psych.  2. History of underlying psoriasis with mild nonspecific leukocytosis. Stable, afebrile, chest x-ray & UA unimpressive. Clinically monitor.   3. Malingering symptoms. Patient had complained on 08-30-15 that he was unable to move extremities and had become nonverbal. Joneen Roach see my note from 08/30/2015, after extensive examination and counseling he started moving his extremities and became verbal, now close to baseline again, family which includes her girlfriend and mother educated bedside both on 08/30/2015 and 09/08/2015. Psych has been consulted.Seen by Psych will be sent to [facility] per Psych.   Discharge diagnosis     Principal Problem:   Opioid overdose Active  Problems:   Leukocytosis   Anemia   Polysubstance abuse   Tobacco use disorder    Discharge instructions    Discharge Instructions    Diet - low sodium heart healthy    Complete by:  As directed      Discharge instructions    Complete by:  As directed   Follow with Primary MD Kaleen MaskELKINS,WILSON OLIVER, MD and your Psychiatrist in 2-3 days   Get CBC, CMP, 2 view Chest X ray checked  by Primary MD or SNF MD in 5-7 days ( we routinely change or add medications that can affect your baseline labs and fluid status, therefore we recommend that you  get the mentioned basic workup next visit with your PCP, your PCP may decide not to get them or add new tests based on their clinical decision)   Activity: As tolerated with Full fall precautions use walker/cane & assistance as needed   Disposition [facility]   Diet:   Heart Healthy     On your next visit with your primary care physician please Get Medicines reviewed and adjusted.   Please request your Prim.MD to go over all Hospital Tests and Procedure/Radiological results at the follow up, please get all Hospital records sent to your Prim MD by signing hospital release before you go home.   If you experience worsening of your admission symptoms, develop shortness of breath, life threatening emergency, suicidal or homicidal thoughts you must seek medical attention immediately by calling 911 or calling your MD immediately  if symptoms less severe.  You Must read complete instructions/literature along with all the possible adverse reactions/side effects for all the Medicines you take and that have been prescribed to you. Take any new Medicines after you have completely understood and accpet all the possible adverse reactions/side effects.   Do not drive, operate heavy machinery, perform activities at heights, swimming or participation in water activities or provide baby sitting services if your were admitted for syncope or siezures until you have seen by Primary MD or a Neurologist and advised to do so again.  Do not drive when taking Pain medications.    Do not take more than prescribed Pain, Sleep and Anxiety Medications  Special Instructions: If you have smoked or chewed Tobacco  in the last 2 yrs please stop smoking, stop any regular Alcohol  and or any Recreational drug use.  Wear Seat belts while driving.   Please note  You were cared for by a hospitalist during your hospital stay. If you have any questions about your discharge medications or the care you received while you were in  the hospital after you are discharged, you can call the unit and asked to speak with the hospitalist on call if the hospitalist that took care of you is not available. Once you are discharged, your primary care physician will handle any further medical issues. Please note that NO REFILLS for any discharge medications will be authorized once you are discharged, as it is imperative that you return to your primary care physician (or establish a relationship with a primary care physician if you do not have one) for your aftercare needs so that they can reassess your need for medications and monitor your lab values.     Increase activity slowly    Complete by:  As directed            Discharge Medications     Medication List    STOP taking these medications  Investigational - Study Medication      TAKE these medications        clonazePAM 0.5 MG tablet  Commonly known as:  KLONOPIN  Take 1 tablet (0.5 mg total) by mouth 2 (two) times daily.     methylphenidate 20 MG tablet  Commonly known as:  RITALIN  Take 60 mg by mouth daily.     SUBOXONE 12-3 MG Film  Generic drug:  Buprenorphine HCl-Naloxone HCl  Place 1 strip under the tongue 2 (two) times daily.        Allergies  Allergen Reactions  . Compazine [Prochlorperazine Edisylate] Other (See Comments)    Blood pressure drops  . Nsaids Other (See Comments)    unknown  . Phenergan [Promethazine Hcl] Other (See Comments)    Blood pressure drops   . Tramadol Other (See Comments)    Blood pressure drops         Follow-up Information    Follow up with Kaleen Mask, MD. Schedule an appointment as soon as possible for a visit in 2 days.   Specialty:  Family Medicine   Why:  and your Psychiatrist   Contact information:   [address] [place] [address] [phone]       Major procedures and Radiology Reports - PLEASE review detailed and final reports thoroughly  -        Dg Chest 1  View  08/29/2015  CLINICAL DATA:  Not responding after vomiting today. Clinical concern for aspiration. Smoker. EXAM: CHEST 1 VIEW COMPARISON:  01/20/2015. FINDINGS: Normal sized heart.  Clear lungs.  Normal appearing bones. IMPRESSION: No acute abnormality. Electronically Signed   By: Beckie Salts M.D.   On: 08/29/2015 16:20    Micro Results    Recent Results (from the past 240 hour(s))  Urine culture     Status: None   Collection Time: 08/29/15  4:53 PM  Result Value Ref Range Status   Specimen Description URINE, CATHETERIZED  Final   Special Requests NONE  Final   Culture NO GROWTH Performed at [facility]   Final   Report Status 08/31/2015 FINAL  Final  Blood culture (routine x 2)     Status: None (Preliminary result)   Collection Time: 08/29/15  7:15 PM  Result Value Ref Range Status   Specimen Description RIGHT ANTECUBITAL BLOOD  Final   Special Requests BOTTLES DRAWN AEROBIC AND ANAEROBIC 5CC  Final   Culture   Final    NO GROWTH 2 DAYS Performed at [facility]    Report Status PENDING  Incomplete  Blood culture (routine x 2)     Status: None (Preliminary result)   Collection Time: 08/29/15  7:21 PM  Result Value Ref Range Status   Specimen Description BLOOD RIGHT FOREARM  Final   Special Requests BOTTLES DRAWN AEROBIC AND ANAEROBIC 5CC  Final   Culture   Final    NO GROWTH 2 DAYS Performed at [facility]    Report Status PENDING  Incomplete    Today   Subjective    Eashan Schipani today has no headache,no chest abdominal pain,no new weakness tingling or numbness, feels much better.   Objective   Blood pressure 129/81, pulse 60, temperature 98.6 F (37 C), temperature source Oral, resp. rate 16, height  (1.753 m), weight 68 kg (149 lb 14.6 oz), SpO2 98 %.   Intake/Output Summary (Last 24 hours) at 09/01/15 0857 Last data filed at 09/01/15 0715  Gross  per 24 hour  Intake 3225.83 ml  Output   1925 ml  Net 1300.83 ml     Exam Awake Alert, Oriented x 3, No new F.N deficits, Normal affect [place].AT,PERRAL Supple Neck,No JVD, No cervical lymphadenopathy appriciated.  Symmetrical Chest wall movement, Good air movement bilaterally, CTAB RRR,No Gallops,Rubs or new Murmurs, No Parasternal Heave +ve B.Sounds, Abd Soft, Non tender, No organomegaly appriciated, No rebound -guarding or rigidity. No Cyanosis, Clubbing or edema, No new Rash or bruise   Data Review   CBC w Diff: Lab Results  Component Value Date   WBC 14.1* 08/30/2015   HGB 11.6* 08/30/2015   HCT 34.3* 08/30/2015   PLT 221 08/30/2015   LYMPHOPCT 13 08/30/2015   MONOPCT 5 08/30/2015   EOSPCT 0 08/30/2015   BASOPCT 0 08/30/2015    CMP: Lab Results  Component Value Date   NA 137 08/30/2015   K 3.8 08/30/2015   CL 106 08/30/2015   CO2 24 08/30/2015   BUN 11 08/30/2015   CREATININE 0.62 08/30/2015   PROT 6.6 08/30/2015   ALBUMIN 3.3* 08/30/2015   BILITOT 0.2* 08/30/2015   ALKPHOS 84 08/30/2015   AST 20 08/30/2015   ALT 16* 08/30/2015  .   Total Time in preparing paper work, data evaluation and todays exam - 35 minutes  Leroy Sea M.D on 09/01/2015 at 8:57 AM  [facility]   Office  [phone]

## 2015-09-02 ENCOUNTER — Inpatient Hospital Stay (HOSPITAL_COMMUNITY)
Admission: EM | Admit: 2015-09-02 | Discharge: 2015-09-13 | DRG: 896 | Disposition: A | Discharge status: Home / Self Care | Payer: Medicaid Other | Attending: Family Medicine | Admitting: Family Medicine

## 2015-09-02 DIAGNOSIS — F1193 Opioid use, unspecified with withdrawal: Secondary | ICD-10-CM

## 2015-09-02 DIAGNOSIS — I959 Hypotension, unspecified: Secondary | ICD-10-CM | POA: Diagnosis present

## 2015-09-02 DIAGNOSIS — J9811 Atelectasis: Secondary | ICD-10-CM | POA: Insufficient documentation

## 2015-09-02 DIAGNOSIS — R569 Unspecified convulsions: Secondary | ICD-10-CM | POA: Diagnosis not present

## 2015-09-02 DIAGNOSIS — R000 Tachycardia, unspecified: Secondary | ICD-10-CM

## 2015-09-02 DIAGNOSIS — R531 Weakness: Secondary | ICD-10-CM

## 2015-09-02 DIAGNOSIS — T17908A Unspecified foreign body in respiratory tract, part unspecified causing other injury, initial encounter: Secondary | ICD-10-CM

## 2015-09-02 DIAGNOSIS — T17890A Other foreign object in other parts of respiratory tract causing asphyxiation, initial encounter: Secondary | ICD-10-CM | POA: Diagnosis not present

## 2015-09-02 DIAGNOSIS — Z978 Presence of other specified devices: Secondary | ICD-10-CM

## 2015-09-02 DIAGNOSIS — F32A Depression, unspecified: Secondary | ICD-10-CM

## 2015-09-02 DIAGNOSIS — J9601 Acute respiratory failure with hypoxia: Secondary | ICD-10-CM | POA: Diagnosis not present

## 2015-09-02 DIAGNOSIS — F17210 Nicotine dependence, cigarettes, uncomplicated: Secondary | ICD-10-CM | POA: Diagnosis present

## 2015-09-02 DIAGNOSIS — Z781 Physical restraint status: Secondary | ICD-10-CM

## 2015-09-02 DIAGNOSIS — A419 Sepsis, unspecified organism: Secondary | ICD-10-CM | POA: Diagnosis present

## 2015-09-02 DIAGNOSIS — G9340 Encephalopathy, unspecified: Secondary | ICD-10-CM | POA: Diagnosis present

## 2015-09-02 DIAGNOSIS — F13239 Sedative, hypnotic or anxiolytic dependence with withdrawal, unspecified: Principal | ICD-10-CM | POA: Diagnosis present

## 2015-09-02 DIAGNOSIS — F4310 Post-traumatic stress disorder, unspecified: Secondary | ICD-10-CM

## 2015-09-02 DIAGNOSIS — J9690 Respiratory failure, unspecified, unspecified whether with hypoxia or hypercapnia: Secondary | ICD-10-CM

## 2015-09-02 DIAGNOSIS — K589 Irritable bowel syndrome without diarrhea: Secondary | ICD-10-CM | POA: Diagnosis present

## 2015-09-02 DIAGNOSIS — F418 Other specified anxiety disorders: Secondary | ICD-10-CM | POA: Diagnosis present

## 2015-09-02 DIAGNOSIS — J189 Pneumonia, unspecified organism: Secondary | ICD-10-CM

## 2015-09-02 DIAGNOSIS — F1910 Other psychoactive substance abuse, uncomplicated: Secondary | ICD-10-CM

## 2015-09-02 DIAGNOSIS — R0602 Shortness of breath: Secondary | ICD-10-CM

## 2015-09-02 DIAGNOSIS — D62 Acute posthemorrhagic anemia: Secondary | ICD-10-CM

## 2015-09-02 DIAGNOSIS — T402X2A Poisoning by other opioids, intentional self-harm, initial encounter: Secondary | ICD-10-CM

## 2015-09-02 DIAGNOSIS — J9600 Acute respiratory failure, unspecified whether with hypoxia or hypercapnia: Secondary | ICD-10-CM

## 2015-09-02 DIAGNOSIS — Z6820 Body mass index (BMI) 20.0-20.9, adult: Secondary | ICD-10-CM

## 2015-09-02 DIAGNOSIS — J690 Pneumonitis due to inhalation of food and vomit: Secondary | ICD-10-CM | POA: Diagnosis not present

## 2015-09-02 DIAGNOSIS — T17908S Unspecified foreign body in respiratory tract, part unspecified causing other injury, sequela: Secondary | ICD-10-CM

## 2015-09-02 DIAGNOSIS — Z452 Encounter for adjustment and management of vascular access device: Secondary | ICD-10-CM

## 2015-09-02 DIAGNOSIS — E46 Unspecified protein-calorie malnutrition: Secondary | ICD-10-CM | POA: Diagnosis present

## 2015-09-02 DIAGNOSIS — R4182 Altered mental status, unspecified: Secondary | ICD-10-CM

## 2015-09-02 DIAGNOSIS — E876 Hypokalemia: Secondary | ICD-10-CM | POA: Diagnosis present

## 2015-09-02 DIAGNOSIS — F329 Major depressive disorder, single episode, unspecified: Secondary | ICD-10-CM

## 2015-09-02 DIAGNOSIS — Z01818 Encounter for other preprocedural examination: Secondary | ICD-10-CM

## 2015-09-02 DIAGNOSIS — F1290 Cannabis use, unspecified, uncomplicated: Secondary | ICD-10-CM | POA: Diagnosis present

## 2015-09-02 HISTORY — DX: Other psychoactive substance abuse, uncomplicated: F19.10

## 2015-09-02 HISTORY — DX: Tachycardia, unspecified: R00.0

## 2015-09-02 NOTE — Progress Notes (Signed)
Disposition: No evidence of imminent risk to self or others at present.  Patient does not meet criteria for psychiatric inpatient admission. Supportive therapy provided about ongoing stressors. Refer to IOP. Patient will be referred to methadone clinic and also previous provider who treated him with the Suboxone   LCSWA followed up with list of outpatient intensive SA.   Vivi BarrackNicole Zaylynn Rickett, Theresia MajorsLCSWA, MSW Clinical Social Worker 5E and Psychiatric Service Line [phone] 09/02/2015  2:53 PM

## 2015-09-02 NOTE — ED Notes (Addendum)
Per EMS- Pt lethargic after taking klonopin and suboxone for his drug withdrawal symptoms. Pt not communicative unless aroused. Recently hospitalized for opiate overdose.

## 2015-09-02 NOTE — Progress Notes (Signed)
[facility]                                                                                                                                                                         Patient Demographics  Jordan CoppBrian Tuckey, is a 35 y.o. male  ZOX:[id number]  WJX:[id number]  DOB - 05/04/1980  Admit date - 08/29/2015  Admitting Physician Bobette Moavid Manuel Ortiz, MD  Outpatient Primary MD for the patient is Kaleen MaskELKINS,WILSON OLIVER, MD  LOS - 3   Chief Complaint  Patient presents with  . Withdrawal         Assessment & Plan    Patient seen briefly today due for discharge soon per Discharge done yesterday by me, no further issues, Vital signs stable, patient feels fine.    His mother is sitting bedside and patient actually says he feels better and today does not complain of any weakness or aphasia, he has clearly been told that he should try and come off of Suboxone over a few weeks along with benzodiazepines. He is not suicidal or homicidal, we are trying for inpatient psych placement, if cleared by psychiatry we might try detox facilities as well.    Medications  Scheduled Meds: . buprenorphine  4 mg Sublingual Daily  . clonazePAM  0.5 mg Oral BID  . enoxaparin (LOVENOX) injection  40 mg Subcutaneous Q24H  . nicotine  21 mg Transdermal Daily   Continuous Infusions:  PRN Meds:.cloNIDine, LORazepam, ondansetron (ZOFRAN) IV    Time Spent in minutes   10 minutes   Susa RaringSINGH,Artelia Game K M.D on 09/02/2015 at 10:32 AM  Between 7am to 7pm - Pager - [phone]  After 7pm go to [email or web address] - password [email or web address]  And look for the night coverage person covering for me after hours  [facility] Office  [phone]    Subjective:   [name] today has, No headache, No chest pain, No abdominal pain - No Nausea, No new weakness tingling or numbness, No Cough - SOB. Says he feels better  Objective:    Filed Vitals:   09/01/15 0553 09/01/15 1427 09/01/15 2156 09/02/15 0536  BP: 129/81 128/77 126/85 125/77  Pulse: 60 66 60 72  Temp: 98.6 F (37 C) 99.5 F (37.5 C) 98.4 F (36.9 C) 98.3 F (36.8 C)  TempSrc: Oral Oral Oral Oral  Resp: 16 18 18 16   Height:      Weight:      SpO2: 98% 98% 96% 99%    Wt Readings from Last 3 Encounters:  08/29/15 68 kg (149 lb 14.6 oz)  01/20/15 67.132 kg (148 lb)     Intake/Output Summary (Last 24 hours) at 09/02/15 1032 Last data filed at 09/02/15 0119  Gross per  24 hour  Intake    240 ml  Output    400 ml  Net   -160 ml    Exam Awake Alert, Oriented X 3, No new F.N deficits, Normal affect .AT,PERRAL Supple Neck,No JVD, No cervical lymphadenopathy appriciated.  Symmetrical Chest wall movement, Good air movement bilaterally, CTAB RRR,No Gallops,Rubs or new Murmurs, No Parasternal Heave +ve B.Sounds, Abd Soft, Non tender, No organomegaly appriciated, No rebound - guarding or rigidity. No Cyanosis, Clubbing or edema, No new Rash or bruise    Data Review

## 2015-09-02 NOTE — Care Management Note (Signed)
Case Management Note  Patient Details  Name: [name] MRN: [id number] Date of Birth: 06/05/1980  Subjective/Objective:   Patient now d/c to home. Cleared by psych. Spoke to patient/mom in rm-[name]-pcp, has pharmacy, home is safe. Will transport home w/Mom.[facility]rep [name]-informed her of d/c home.                 Action/Plan:d/c home.   Expected Discharge Date:   (unknown)               Expected Discharge Plan:  Home/Self Care  In-House Referral:     Discharge planning Services  CM Consult  Post Acute Care Choice:    Choice offered to:     DME Arranged:    DME Agency:     HH Arranged:    HH Agency:     Status of Service:  Completed, signed off  If discussed at [employer] Length of Stay Meetings, dates discussed:    Additional Comments:  Lanier ClamMahabir, Topacio Cella, RN 09/02/2015, 3:26 PM

## 2015-09-02 NOTE — ED Notes (Signed)
Upon assessment, pt nonverbal, with nystagmus, but following commands and avoiding light with his eyes.

## 2015-09-02 NOTE — Consult Note (Signed)
[name] Face-to-Face Psychiatry Consult follow-up  Reason for Consult:  Polyubstance abuse and withdrawals from opioid and benzo's Referring Physician:  Dr. Thedore Mins Patient Identification: [name] MRN:  [id number] Principal Diagnosis: Opioid overdose Diagnosis:   Patient Active Problem List   Diagnosis Date Noted  . Opioid overdose [T40.2X1A] 08/29/2015  . Leukocytosis [D72.829] 08/29/2015  . Anemia [D64.9] 08/29/2015  . Polysubstance abuse [F19.10] 08/29/2015  . Tobacco use disorder [F17.200] 08/29/2015    Total Time spent with patient: 20 minutes  Subjective:   [name] is a 35 y.o. male patient admitted with polysubstance abuse and depression  HPI:  [name] is a 35 y.o. male, Seen, chart reviewed and case discussed with the patient, his mother and his girlfriend who were at bedside. Patient reported he ran out of his drug supply and then started having withdrawal symptoms especially from his opiates and benzodiazepines. Patient mother called the sheriff department who transported him to the [place] [facility]. Patient is currently having craving for drugs and also withdrawal symptoms like nausea, vomiting, abdomen upset, disturbed sleep, appetite, decreased energy, feeling generalized body pains, aches and weakness.   Patient has significant of benzodiazepine abuse, opiate abuse, cannabis abuse, tobacco use disorder, depression, anxiety, PTSD, IBS who comes in emergency department via EMS after his girlfriend noticed that he was very lethargic. He also states that he has had 3 episodes of emesis and 4-5 episodes of diarrhea today from withdrawal symptoms.  patient denies current suicidal/homicidal ideation, intention or plans. 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 Patient does not want to be his financial  resources but endorses using design drugs of benzodiazepines and opioids from online shopping from [place] over 4 years. He has a very high tolerance to drug of abuse reportedly can take fentanyl 89 mg a day and alprazolam 150 mg a day. Patient has no current legal charges.   Review the following for more details: Per patient,  his lethargy began after taking 8 mg of Suboxone 3 hours while trying to detox from 4-Fluorobutyrfentanyl 89 mg, which he has been taking 3-4 times a day, took last yesterday evening around 19:00, and 60 mg of etizolam 5 hours prior to the beginning of symptoms. He has been taking the etizolam about 3 times a day. Both of these agents are designer drugs, which he has been obtaining from the Internet. He states that he would like to get into a detoxification program. He denies depressive symptoms, suicidal or homicidal ideas  Past Psychiatric History: Polysubstance abuse since age 82 years old and had multiple rehabilitation treatments and detox treatment.   09/02/2015. Interval History: Patient seen along with urinating LCSW for psychiatric consultation follow-up. Patient appeared calm and cooperative and pleasant today. Patient reported he has a minimum withdrawal symptoms today and has no more distress. Patient is also stated no symptoms of depression, anxiety, withdrawal symptoms, cravings for drugs of abuse and wish to go home. Patient has no active suicidal/homicidal ideation, intention or plans. Patient has no evidence of psychosis. Patient mom and girlfriend who walked into the room again with the plan of intensive outpatient program for substance abuse and also taking him back to his Suboxone provider at [place], [place]. Patient stated if he cannot go on Suboxone provider because of financial difficulties he may want to go to methadone clinic which is  a [facility]. Patient stated he is not going to go and buy the designer drugs and use anymore because it scared  him and his mother and fianc. Patient contract for safety at this time. He is extremely knowledgeable about his drug of abuse and manipulative.   Risk to Self: Is patient at risk for suicide?: No, but patient needs Medical Clearance Risk to Others:   Prior Inpatient Therapy:   Prior Outpatient Therapy:    Past Medical History:  Past Medical History  Diagnosis Date  . Benzodiazepine abuse   . History of IBS   . Depression   . Anxiety   . Drug use    History reviewed. No pertinent past surgical history. Family History: History reviewed. No pertinent family history. Family Psychiatric  History: significant for mental illness in the family and also drug of abuse. Patient mother has been suffering with bipolar disorder and was previously [facility].  Social History:  History  Alcohol Use No     History  Drug Use  . Yes  . Special: Marijuana    Social History   Social History  . Marital Status: Single    Spouse Name: N/A  . Number of Children: N/A  . Years of Education: N/A   Social History Main Topics  . Smoking status: Current Every Day Smoker -- 0.50 packs/day    Types: Cigarettes  . Smokeless tobacco: None  . Alcohol Use: No  . Drug Use: Yes    Special: Marijuana  . Sexual Activity: Not Asked   Other Topics Concern  . None   Social History Narrative   Additional Social History:    Allergies:   Allergies  Allergen Reactions  . Compazine [Prochlorperazine Edisylate] Other (See Comments)    Blood pressure drops  . Nsaids Other (See Comments)    unknown  . Phenergan [Promethazine Hcl] Other (See Comments)    Blood pressure drops   . Tramadol Other (See Comments)    Blood pressure drops     Labs:  No results found for this or any previous visit (from the past 48 hour(s)).  Current Facility-Administered Medications  Medication Dose Route Frequency Provider Last Rate Last Dose  . buprenorphine (SUBUTEX) SL tablet 4 mg  4 mg  Sublingual Daily Leroy Sea, MD   4 mg at 09/02/15 1130  . clonazePAM (KLONOPIN) tablet 0.5 mg  0.5 mg Oral BID Leroy Sea, MD   0.5 mg at 09/02/15 1130  . cloNIDine (CATAPRES) tablet 0.1 mg  0.1 mg Oral Q6H PRN Bobette Mo, MD   0.1 mg at 09/01/15 1411  . enoxaparin (LOVENOX) injection 40 mg  40 mg Subcutaneous Q24H Bobette Mo, MD   40 mg at 09/01/15 2228  . LORazepam (ATIVAN) injection 0.5 mg  0.5 mg Intravenous Q6H PRN Leroy Sea, MD      . nicotine (NICODERM CQ - dosed in mg/24 hours) patch 21 mg  21 mg Transdermal Daily Bobette Mo, MD   21 mg at 09/02/15 1131  . ondansetron (ZOFRAN) injection 4 mg  4 mg Intravenous Q6H PRN Bobette Mo, MD   4 mg at 08/31/15 1743    Musculoskeletal: Strength & Muscle Tone: decreased Gait & Station: unable to stand Patient leans: N/A  Psychiatric Specialty Exam: Physical Exam :  ROS:  Blood pressure 125/77, pulse 72, temperature 98.3 F (36.8 C), temperature source Oral, resp. rate 16, height 5\' 9"  (1.753 m), weight  68 kg (149 lb 14.6 oz), SpO2 99 %.Body mass index is 22.13 kg/(m^2).  General Appearance: Casual  Eye Contact:  Fair  Speech:  Clear and Coherent  Volume:  Normal  Mood:  Euthymic  Affect:  Appropriate, Congruent and Reactive  Thought Process:  Coherent and Goal Directed  Orientation:  Full (Time, Place, and Person)  Thought Content:  Logical  Suicidal Thoughts:  No  Homicidal Thoughts:  No  Memory:  Immediate;   Fair Recent;   Fair Remote;   Fair  Judgement:  Impaired  Insight:  Fair  Psychomotor Activity:  Decreased  Concentration:  Concentration: Good and Attention Span: Good  Recall:  Fair  Fund of Knowledge:  Good  Language:  Good  Akathisia:  Negative  Handed:  Right  AIMS (if indicated):     Assets:  Communication Skills Desire for Improvement Housing Intimacy Leisure Time Resilience Social Support Transportation  ADL's:  Impaired  Cognition:  WNL  Sleep:         Treatment Plan Summary: Patient has been showing improvement from his withdrawal symptoms of benzodiazepines and opiates and currently has very minimum symptoms which is not clear to outside people. Patient feels comfortable and requesting to go home and denied craving for drugs, willing to participate in chemical dependency intensive outpatient program at [facility] and also going back to Suboxone provider he has been seen for the last 4 years. Patient is also has alternative plan of methadone clinic participation in financial difficulty arises. Patient mother is supportive to him.   Patient Will be referred to both public funded methadone clinic as  value as  previous provider at Suboxone clinic in [place], as patient mother reported now he can be re-accepted as per her communication with the clinic   Patient is also willing to participate in chemical dependency intensive outpatient program  Case discussed with the unit social service and also Dr. Thedore MinsSingh who stated patient is medically stable at this time.  Disposition: No evidence of imminent risk to self or others at present.   Patient does not meet criteria for psychiatric inpatient admission. Supportive therapy provided about ongoing stressors. Refer to IOP. Patient will be referred to methadone clinic and also previous provider who treated him with the Suboxone    Leata MouseJANARDHANA Dvaughn Fickle, MD 09/02/2015 12:31 PM

## 2015-09-02 NOTE — ED Notes (Signed)
Pt has vomited a large amount.

## 2015-09-02 NOTE — ED Notes (Signed)
Per Family, pt is taking a much smaller dose of benzos than what he usually does.

## 2015-09-02 NOTE — ED Notes (Signed)
Bed: [id number] Expected date:  Expected time:  Means of arrival:  Comments: EMS 35 yo male withdrawal from benzo's/took meds after discharge today-now lethargic

## 2015-09-03 ENCOUNTER — Inpatient Hospital Stay (HOSPITAL_COMMUNITY): Payer: Medicaid Other

## 2015-09-03 ENCOUNTER — Emergency Department (HOSPITAL_COMMUNITY): Payer: Medicaid Other

## 2015-09-03 ENCOUNTER — Observation Stay (HOSPITAL_COMMUNITY): Payer: Medicaid Other

## 2015-09-03 DIAGNOSIS — Z781 Physical restraint status: Secondary | ICD-10-CM | POA: Diagnosis not present

## 2015-09-03 DIAGNOSIS — F1910 Other psychoactive substance abuse, uncomplicated: Secondary | ICD-10-CM

## 2015-09-03 DIAGNOSIS — R000 Tachycardia, unspecified: Secondary | ICD-10-CM | POA: Diagnosis not present

## 2015-09-03 DIAGNOSIS — A419 Sepsis, unspecified organism: Secondary | ICD-10-CM | POA: Diagnosis present

## 2015-09-03 DIAGNOSIS — J9811 Atelectasis: Secondary | ICD-10-CM | POA: Diagnosis not present

## 2015-09-03 DIAGNOSIS — Z6820 Body mass index (BMI) 20.0-20.9, adult: Secondary | ICD-10-CM | POA: Diagnosis not present

## 2015-09-03 DIAGNOSIS — F17210 Nicotine dependence, cigarettes, uncomplicated: Secondary | ICD-10-CM | POA: Diagnosis present

## 2015-09-03 DIAGNOSIS — D62 Acute posthemorrhagic anemia: Secondary | ICD-10-CM | POA: Diagnosis present

## 2015-09-03 DIAGNOSIS — F418 Other specified anxiety disorders: Secondary | ICD-10-CM | POA: Diagnosis present

## 2015-09-03 DIAGNOSIS — R4182 Altered mental status, unspecified: Secondary | ICD-10-CM | POA: Diagnosis present

## 2015-09-03 DIAGNOSIS — R569 Unspecified convulsions: Secondary | ICD-10-CM | POA: Diagnosis not present

## 2015-09-03 DIAGNOSIS — G9340 Encephalopathy, unspecified: Secondary | ICD-10-CM | POA: Diagnosis present

## 2015-09-03 DIAGNOSIS — F1290 Cannabis use, unspecified, uncomplicated: Secondary | ICD-10-CM | POA: Diagnosis present

## 2015-09-03 DIAGNOSIS — I959 Hypotension, unspecified: Secondary | ICD-10-CM | POA: Diagnosis present

## 2015-09-03 DIAGNOSIS — J690 Pneumonitis due to inhalation of food and vomit: Secondary | ICD-10-CM

## 2015-09-03 DIAGNOSIS — J9601 Acute respiratory failure with hypoxia: Secondary | ICD-10-CM

## 2015-09-03 DIAGNOSIS — R531 Weakness: Secondary | ICD-10-CM | POA: Diagnosis not present

## 2015-09-03 DIAGNOSIS — F329 Major depressive disorder, single episode, unspecified: Secondary | ICD-10-CM | POA: Diagnosis not present

## 2015-09-03 DIAGNOSIS — E46 Unspecified protein-calorie malnutrition: Secondary | ICD-10-CM | POA: Diagnosis present

## 2015-09-03 DIAGNOSIS — F13239 Sedative, hypnotic or anxiolytic dependence with withdrawal, unspecified: Secondary | ICD-10-CM | POA: Diagnosis present

## 2015-09-03 DIAGNOSIS — E876 Hypokalemia: Secondary | ICD-10-CM | POA: Diagnosis present

## 2015-09-03 DIAGNOSIS — F4310 Post-traumatic stress disorder, unspecified: Secondary | ICD-10-CM | POA: Diagnosis present

## 2015-09-03 DIAGNOSIS — K589 Irritable bowel syndrome without diarrhea: Secondary | ICD-10-CM | POA: Diagnosis present

## 2015-09-03 DIAGNOSIS — T17890A Other foreign object in other parts of respiratory tract causing asphyxiation, initial encounter: Secondary | ICD-10-CM | POA: Diagnosis not present

## 2015-09-03 LAB — CBC WITH DIFFERENTIAL/PLATELET
BASOS PCT: 0 %
Basophils Absolute: 0 10*3/uL (ref 0.0–0.1)
EOS ABS: 0 10*3/uL (ref 0.0–0.7)
EOS PCT: 0 %
HCT: 38.9 % — ABNORMAL LOW (ref 39.0–52.0)
HEMOGLOBIN: 13.6 g/dL (ref 13.0–17.0)
Lymphocytes Relative: 14 %
Lymphs Abs: 1.4 10*3/uL (ref 0.7–4.0)
MCH: 30 pg (ref 26.0–34.0)
MCHC: 35 g/dL (ref 30.0–36.0)
MCV: 85.9 fL (ref 78.0–100.0)
Monocytes Absolute: 0.7 10*3/uL (ref 0.1–1.0)
Monocytes Relative: 7 %
NEUTROS PCT: 79 %
Neutro Abs: 7.9 10*3/uL — ABNORMAL HIGH (ref 1.7–7.7)
PLATELETS: 293 10*3/uL (ref 150–400)
RBC: 4.53 MIL/uL (ref 4.22–5.81)
RDW: 12.5 % (ref 11.5–15.5)
WBC: 10.1 10*3/uL (ref 4.0–10.5)

## 2015-09-03 LAB — BASIC METABOLIC PANEL
Anion gap: 12 (ref 5–15)
BUN: 11 mg/dL (ref 6–20)
CALCIUM: 9 mg/dL (ref 8.9–10.3)
CO2: 26 mmol/L (ref 22–32)
CREATININE: 0.63 mg/dL (ref 0.61–1.24)
Chloride: 102 mmol/L (ref 101–111)
GFR calc non Af Amer: >60 mL/min (ref >60)
Glucose, Bld: 85 mg/dL (ref 65–99)
Potassium: 3.6 mmol/L (ref 3.5–5.1)
SODIUM: 140 mmol/L (ref 135–145)

## 2015-09-03 LAB — CULTURE, BLOOD (ROUTINE X 2)
CULTURE: NO GROWTH
CULTURE: NO GROWTH

## 2015-09-03 LAB — BLOOD GAS, ARTERIAL
Acid-base deficit: 2.2 mmol/L — ABNORMAL HIGH (ref 0.0–2.0)
Bicarbonate: 21.7 mEq/L (ref 20.0–24.0)
Drawn by: 213381
O2 CONTENT: 15 L/min
O2 Saturation: 99.3 %
PCO2 ART: 34.6 mmHg — AB (ref 35.0–45.0)
PH ART: 7.414 (ref 7.350–7.450)
Patient temperature: 98.6
TCO2: 22.8 mmol/L (ref 0–100)
pO2, Arterial: 213 mmHg — ABNORMAL HIGH (ref 80.0–100.0)

## 2015-09-03 LAB — POCT I-STAT 3, ART BLOOD GAS (G3+)
Acid-base deficit: 5 mmol/L — ABNORMAL HIGH (ref 0.0–2.0)
Bicarbonate: 20 mEq/L (ref 20.0–24.0)
O2 Saturation: 97 %
PCO2 ART: 37.1 mmHg (ref 35.0–45.0)
PH ART: 7.345 — AB (ref 7.350–7.450)
PO2 ART: 99 mmHg (ref 80.0–100.0)
Patient temperature: 101
TCO2: 21 mmol/L (ref 0–100)

## 2015-09-03 LAB — HEPATIC FUNCTION PANEL
ALK PHOS: 90 U/L (ref 38–126)
ALT: 16 U/L — AB (ref 17–63)
AST: 35 U/L (ref 15–41)
Albumin: 4 g/dL (ref 3.5–5.0)
BILIRUBIN DIRECT: 0.6 mg/dL — AB (ref 0.1–0.5)
Indirect Bilirubin: 1.4 mg/dL — ABNORMAL HIGH (ref 0.3–0.9)
Total Bilirubin: 2 mg/dL — ABNORMAL HIGH (ref 0.3–1.2)
Total Protein: 8 g/dL (ref 6.5–8.1)

## 2015-09-03 LAB — URINALYSIS, ROUTINE W REFLEX MICROSCOPIC
Glucose, UA: NEGATIVE mg/dL
HGB URINE DIPSTICK: NEGATIVE
Ketones, ur: >80 mg/dL — AB
LEUKOCYTES UA: NEGATIVE
NITRITE: NEGATIVE
PROTEIN: NEGATIVE mg/dL
SPECIFIC GRAVITY, URINE: 1.028 (ref 1.005–1.030)
pH: 6 (ref 5.0–8.0)

## 2015-09-03 LAB — RAPID URINE DRUG SCREEN, HOSP PERFORMED
AMPHETAMINES: NOT DETECTED
BENZODIAZEPINES: POSITIVE — AB
Barbiturates: NOT DETECTED
COCAINE: NOT DETECTED
OPIATES: NOT DETECTED
TETRAHYDROCANNABINOL: POSITIVE — AB

## 2015-09-03 LAB — GLUCOSE, CAPILLARY
GLUCOSE-CAPILLARY: 83 mg/dL (ref 65–99)
Glucose-Capillary: 86 mg/dL (ref 65–99)

## 2015-09-03 LAB — LIPASE, BLOOD: Lipase: 29 U/L (ref 11–51)

## 2015-09-03 LAB — PHOSPHORUS: Phosphorus: 3.8 mg/dL (ref 2.5–4.6)

## 2015-09-03 LAB — TRIGLYCERIDES: TRIGLYCERIDES: 92 mg/dL (ref <150)

## 2015-09-03 LAB — MAGNESIUM: MAGNESIUM: 1.7 mg/dL (ref 1.7–2.4)

## 2015-09-03 LAB — ETHANOL

## 2015-09-03 LAB — I-STAT CG4 LACTIC ACID, ED: Lactic Acid, Venous: 0.89 mmol/L (ref 0.5–1.9)

## 2015-09-03 LAB — MRSA PCR SCREENING: MRSA BY PCR: NEGATIVE

## 2015-09-03 LAB — AMMONIA: Ammonia: 40 umol/L — ABNORMAL HIGH (ref 9–35)

## 2015-09-03 MED ORDER — LORAZEPAM 2 MG/ML IJ SOLN
1.0000 mg | Freq: Once | INTRAMUSCULAR | Status: AC
  Administered 2015-09-03: 1 mg via INTRAVENOUS

## 2015-09-03 MED ORDER — PANTOPRAZOLE SODIUM 40 MG PO PACK
40.0000 mg | PACK | ORAL | Status: DC
  Administered 2015-09-03 – 2015-09-06 (×3): 40 mg
  Filled 2015-09-03 (×5): qty 20

## 2015-09-03 MED ORDER — PROPOFOL 1000 MG/100ML IV EMUL
0.0000 ug/kg/min | INTRAVENOUS | Status: DC
  Administered 2015-09-03 (×2): 40 ug/kg/min via INTRAVENOUS
  Filled 2015-09-03: qty 100
  Filled 2015-09-03: qty 200

## 2015-09-03 MED ORDER — SODIUM CHLORIDE 0.9 % IV SOLN
INTRAVENOUS | Status: DC
  Administered 2015-09-03 – 2015-09-04 (×3): via INTRAVENOUS

## 2015-09-03 MED ORDER — PRO-STAT SUGAR FREE PO LIQD
30.0000 mL | Freq: Two times a day (BID) | ORAL | Status: DC
  Administered 2015-09-03: 30 mL
  Filled 2015-09-03 (×2): qty 30

## 2015-09-03 MED ORDER — FENTANYL CITRATE (PF) 100 MCG/2ML IJ SOLN
100.0000 ug | Freq: Once | INTRAMUSCULAR | Status: AC
  Administered 2015-09-03: 100 ug via INTRAVENOUS

## 2015-09-03 MED ORDER — DOCUSATE SODIUM 50 MG/5ML PO LIQD
100.0000 mg | Freq: Two times a day (BID) | ORAL | Status: DC | PRN
  Filled 2015-09-03: qty 10

## 2015-09-03 MED ORDER — CHLORHEXIDINE GLUCONATE 0.12% ORAL RINSE (MEDLINE KIT)
15.0000 mL | Freq: Two times a day (BID) | OROMUCOSAL | Status: DC
  Administered 2015-09-03 – 2015-09-04 (×2): 15 mL via OROMUCOSAL

## 2015-09-03 MED ORDER — FENTANYL BOLUS VIA INFUSION
50.0000 ug | INTRAVENOUS | Status: DC | PRN
  Administered 2015-09-04: 100 ug via INTRAVENOUS
  Filled 2015-09-03: qty 50

## 2015-09-03 MED ORDER — CLONAZEPAM 0.5 MG PO TABS: 0.5000 mg | ORAL_TABLET | Freq: Two times a day (BID) | ORAL | Status: DC

## 2015-09-03 MED ORDER — LORAZEPAM 2 MG/ML IJ SOLN: 0.5000 mg | Freq: Four times a day (QID) | INTRAMUSCULAR | Status: DC

## 2015-09-03 MED ORDER — BUPRENORPHINE HCL 2 MG SL SUBL: 12.0000 mg | SUBLINGUAL_TABLET | Freq: Two times a day (BID) | SUBLINGUAL | Status: DC

## 2015-09-03 MED ORDER — ENOXAPARIN SODIUM 40 MG/0.4ML ~~LOC~~ SOLN
40.0000 mg | SUBCUTANEOUS | Status: DC
  Administered 2015-09-03 – 2015-09-12 (×10): 40 mg via SUBCUTANEOUS
  Filled 2015-09-03 (×11): qty 0.4

## 2015-09-03 MED ORDER — MIDAZOLAM HCL 2 MG/2ML IJ SOLN
INTRAMUSCULAR | Status: AC
  Administered 2015-09-03: 4 mg via INTRAVENOUS
  Filled 2015-09-03: qty 4

## 2015-09-03 MED ORDER — FENTANYL CITRATE (PF) 100 MCG/2ML IJ SOLN
INTRAMUSCULAR | Status: AC
  Administered 2015-09-03: 20:00:00
  Filled 2015-09-03: qty 2

## 2015-09-03 MED ORDER — LORAZEPAM 2 MG/ML IJ SOLN
INTRAMUSCULAR | Status: AC
  Administered 2015-09-03: 1 mg via INTRAVENOUS
  Filled 2015-09-03: qty 1

## 2015-09-03 MED ORDER — SUCCINYLCHOLINE CHLORIDE 20 MG/ML IJ SOLN
60.0000 mg | Freq: Once | INTRAMUSCULAR | Status: AC
  Administered 2015-09-03: 60 mg via INTRAVENOUS
  Filled 2015-09-03: qty 3

## 2015-09-03 MED ORDER — ANTISEPTIC ORAL RINSE SOLUTION (CORINZ)
7.0000 mL | Freq: Four times a day (QID) | OROMUCOSAL | Status: DC
  Administered 2015-09-04 (×3): 7 mL via OROMUCOSAL

## 2015-09-03 MED ORDER — BUPRENORPHINE HCL-NALOXONE HCL 12-3 MG SL FILM: 1.0000 | ORAL_FILM | Freq: Two times a day (BID) | SUBLINGUAL | Status: DC

## 2015-09-03 MED ORDER — LORAZEPAM 2 MG/ML IJ SOLN: 0.5000 mg | INTRAMUSCULAR | Status: DC

## 2015-09-03 MED ORDER — FENTANYL CITRATE (PF) 100 MCG/2ML IJ SOLN
INTRAMUSCULAR | Status: AC
  Administered 2015-09-03: 100 ug via INTRAVENOUS
  Filled 2015-09-03: qty 2

## 2015-09-03 MED ORDER — SODIUM CHLORIDE 0.9 % IV SOLN
3.0000 g | Freq: Four times a day (QID) | INTRAVENOUS | Status: AC
  Administered 2015-09-03 – 2015-09-09 (×25): 3 g via INTRAVENOUS
  Filled 2015-09-03 (×28): qty 3

## 2015-09-03 MED ORDER — VITAL HIGH PROTEIN PO LIQD
1000.0000 mL | ORAL | Status: DC
  Administered 2015-09-03: 1000 mL

## 2015-09-03 MED ORDER — MIDAZOLAM HCL 2 MG/2ML IJ SOLN
4.0000 mg | Freq: Once | INTRAMUSCULAR | Status: AC
Start: 2015-09-03 — End: 2015-09-03
  Administered 2015-09-03: 4 mg via INTRAVENOUS

## 2015-09-03 MED ORDER — SODIUM CHLORIDE 0.9 % IV SOLN
25.0000 ug/h | INTRAVENOUS | Status: DC
  Administered 2015-09-03: 150 ug/h via INTRAVENOUS
  Filled 2015-09-03: qty 50

## 2015-09-03 MED ORDER — MIDAZOLAM HCL 2 MG/2ML IJ SOLN
2.0000 mg | INTRAMUSCULAR | Status: DC | PRN
  Administered 2015-09-03: 2 mg via INTRAVENOUS
  Filled 2015-09-03: qty 2

## 2015-09-03 MED ORDER — BISACODYL 10 MG RE SUPP: 10.0000 mg | Freq: Every day | RECTAL | Status: DC | PRN

## 2015-09-03 MED ORDER — SODIUM CHLORIDE 0.9 % IV BOLUS (SEPSIS)
1000.0000 mL | Freq: Once | INTRAVENOUS | Status: AC
  Administered 2015-09-03: 1000 mL via INTRAVENOUS

## 2015-09-03 MED ORDER — ETOMIDATE 2 MG/ML IV SOLN
20.0000 mg | Freq: Once | INTRAVENOUS | Status: AC
  Administered 2015-09-03: 20 mg via INTRAVENOUS
  Filled 2015-09-03: qty 10

## 2015-09-03 NOTE — H&P (Signed)
History and Physical    Jordan PilonBrian A Chapman ZOX:[id number] DOB: 02/28/1980 DOA: 09/02/2015   PCP: Kaleen MaskELKINS,WILSON OLIVER, MD Chief Complaint:  Chief Complaint  Patient presents with  . Drug Problem    HPI: Jordan PilonBrian A Chapman is a 35 y.o. male with medical history significant of Polysubstance abuse.  He presents to the ED for evaluation of altered mental status. He is here with his mother, who was with him since hospital discharge earlier today. She states that since he got home he has been intermittently sleepy, and then began vomiting. She gave him Suboxone 8 mg at the instruction of his addictionologist, to see if it would help.  The patient spit this medication out.  Of note he had just been admitted to the hospital earlier this week because he made the mistake of taking Suboxone while on a fentanyl analog and this participated withdrawal.  His mother thinks he is going through withdrawal from "designer drugs". He had been using medication, which he obtained, from the Internet, in [place]. These medications are stated to be"4-Fluroisobutyrfentanyl (4-FIBF), 89mg  per day, and Etizolam 150-200 mg per day. The addictionologist, is Dr. Bethann PunchesAnn Hendrix, phone number is [phone].  Patient's mother states that he has "unresponsive". She called an ambulance to bring him here.  At discharge today, by hospitalist after seen by psychiatry, the patient's plan was to follow-up with his addictionologist, to continue Suboxone treatment, or get treatment at a local methadone clinic.  His addictionologist has called earlier this evening and I spoke with her.  She is unhappy that the patient was discharged in such a manner and no one had contacted her.  She does feel that he should be in an inpatient addiction program.  She is also very unhappy that the patient didn't tell her anything about the 4-FIBF (actually it sounds like the only reason that we even know or knew about it is because his fiance told us).  ED  Course: Patient evaluated by Dr. Effie ShyWentz, see his note.  Thankfully other than a complete refusal to talk or make any vocal sounds at the moment, he is essentially unremarkable from a medical perspective.  Review of Systems: Unable to obtain, patient is refusing to speak at the moment.   Past Medical History  Diagnosis Date  . Benzodiazepine abuse   . History of IBS   . Depression   . Anxiety   . Drug use     No past surgical history on file. Unable to obtain from patient.   reports that he has been smoking Cigarettes.  He has been smoking about 0.50 packs per day. He does not have any smokeless tobacco history on file. He reports that he uses illicit drugs (Marijuana). He reports that he does not drink alcohol.  Allergies  Allergen Reactions  . Compazine [Prochlorperazine Edisylate] Other (See Comments)    Blood pressure drops  . Nsaids Other (See Comments)    unknown  . Phenergan [Promethazine Hcl] Other (See Comments)    Blood pressure drops   . Tramadol Other (See Comments)    Blood pressure drops     No family history on file. Unable to obtain from patient.   Prior to Admission medications   Medication Sig Start Date End Date Taking? Authorizing Provider  Buprenorphine HCl-Naloxone HCl (SUBOXONE) 12-3 MG FILM Place 1 strip under the tongue 2 (two) times daily.   Yes Historical Provider, MD  clonazePAM (KLONOPIN) 0.5 MG tablet Take 1 tablet (0.5 mg total) by mouth 2 (  two) times daily. 09/01/15  Yes Leroy Sea, MD  methylphenidate (RITALIN) 20 MG tablet Take 60 mg by mouth daily.   Yes Historical Provider, MD    Physical Exam: Filed Vitals:   09/03/15 0130 09/03/15 0200 09/03/15 0215 09/03/15 0230  BP: 132/88 130/79  125/82  Pulse: 70  71 74  Temp:      TempSrc:      Resp: 23  23 23   SpO2: 99%  97% 97%      Constitutional: NAD, calm, comfortable Eyes: PERRL, lids and conjunctivae normal, Pupils reactive bilaterally, L slightly larger than R ENMT: Mucous  membranes are moist. Posterior pharynx clear of any exudate or lesions.Normal dentition.  Neck: normal, supple, no masses, no thyromegaly Respiratory: clear to auscultation bilaterally, no wheezing, no crackles. Normal respiratory effort. No accessory muscle use.  Cardiovascular: Regular rate and rhythm, no murmurs / rubs / gallops. No extremity edema. 2+ pedal pulses. No carotid bruits.  Abdomen: no tenderness, no masses palpated. No hepatosplenomegaly. Bowel sounds positive.  Musculoskeletal: no clubbing / cyanosis. No joint deformity upper and lower extremities. Good ROM, no contractures. Normal muscle tone.  Skin: no rashes, lesions, ulcers. No induration Neurologic: CN 2-12 grossly intact. Sensation intact, DTR normal. Strength 5/5 in all 4.  Psychiatric: Appears depressed, does not speak words or make sounds, yet he is most certainly following commands briskly, answering yes/no questions by nodding or shaking head briskly, following me around the room with his eyes during exam.  Briskly moves all 4 extremities, no facial droop.   Labs on Admission: I have personally reviewed following labs and imaging studies  CBC:  Recent Labs Lab 08/29/15 1635 08/30/15 0513 09/03/15 0034  WBC 14.5* 14.1* 10.1  NEUTROABS  --  11.6* 7.9*  HGB 12.9* 11.6* 13.6  HCT 38.8* 34.3* 38.9*  MCV 89.4 88.9 85.9  PLT 214 221 293   Basic Metabolic Panel:  Recent Labs Lab 08/29/15 1635 08/30/15 0513 09/03/15 0034  NA 139 137 140  K 4.1 3.8 3.6  CL 107 106 102  CO2 24 24 26   GLUCOSE 147* 99 85  BUN 16 11 11   CREATININE 0.79 0.62 0.63  CALCIUM 8.1* 8.0* 9.0   GFR: Estimated Creatinine Clearance: 126.3 mL/min (by C-G formula based on Cr of 0.63). Liver Function Tests:  Recent Labs Lab 08/29/15 1635 08/30/15 0513 09/03/15 0034  AST 22 20 35  ALT 17 16* 16*  ALKPHOS 88 84 90  BILITOT 0.5 0.2* 2.0*  PROT 7.0 6.6 8.0  ALBUMIN 3.6 3.3* 4.0    Recent Labs Lab 09/03/15 0034  LIPASE 29     Recent Labs Lab 09/03/15 0034  AMMONIA 40*   Coagulation Profile: No results for input(s): INR, PROTIME in the last 168 hours. Cardiac Enzymes: No results for input(s): CKTOTAL, CKMB, CKMBINDEX, TROPONINI in the last 168 hours. BNP (last 3 results) No results for input(s): PROBNP in the last 8760 hours. HbA1C: No results for input(s): HGBA1C in the last 72 hours. CBG:  Recent Labs Lab 08/29/15 1559  GLUCAP 127*   Lipid Profile: No results for input(s): CHOL, HDL, LDLCALC, TRIG, CHOLHDL, LDLDIRECT in the last 72 hours. Thyroid Function Tests: No results for input(s): TSH, T4TOTAL, FREET4, T3FREE, THYROIDAB in the last 72 hours. Anemia Panel: No results for input(s): VITAMINB12, FOLATE, FERRITIN, TIBC, IRON, RETICCTPCT in the last 72 hours. Urine analysis:    Component Value Date/Time   COLORURINE YELLOW 09/03/2015 0056   APPEARANCEUR CLEAR 09/03/2015 0056  LABSPEC 1.028 09/03/2015 0056   PHURINE 6.0 09/03/2015 0056   GLUCOSEU NEGATIVE 09/03/2015 0056   HGBUR NEGATIVE 09/03/2015 0056   BILIRUBINUR SMALL* 09/03/2015 0056   KETONESUR >80* 09/03/2015 0056   PROTEINUR NEGATIVE 09/03/2015 0056   UROBILINOGEN 1.0 07/25/2013 2227   NITRITE NEGATIVE 09/03/2015 0056   LEUKOCYTESUR NEGATIVE 09/03/2015 0056   Sepsis Labs: (procalcitonin:4,lacticidven:4) ) Recent Results (from the past 240 hour(s))  Urine culture     Status: None   Collection Time: 08/29/15  4:53 PM  Result Value Ref Range Status   Specimen Description URINE, CATHETERIZED  Final   Special Requests NONE  Final   Culture NO GROWTH Performed at [facility]   Final   Report Status 08/31/2015 FINAL  Final  Blood culture (routine x 2)     Status: None (Preliminary result)   Collection Time: 08/29/15  7:15 PM  Result Value Ref Range Status   Specimen Description RIGHT ANTECUBITAL BLOOD  Final   Special Requests BOTTLES DRAWN AEROBIC AND ANAEROBIC 5CC  Final   Culture   Final    NO  GROWTH 4 DAYS Performed at [facility]    Report Status PENDING  Incomplete  Blood culture (routine x 2)     Status: None (Preliminary result)   Collection Time: 08/29/15  7:21 PM  Result Value Ref Range Status   Specimen Description BLOOD RIGHT FOREARM  Final   Special Requests BOTTLES DRAWN AEROBIC AND ANAEROBIC 5CC  Final   Culture   Final    NO GROWTH 4 DAYS Performed at [facility]    Report Status PENDING  Incomplete     Radiological Exams on Admission: Ct Head Wo Contrast  09/03/2015  CLINICAL DATA:  35 year old male with altered mental status EXAM: CT HEAD WITHOUT CONTRAST TECHNIQUE: Contiguous axial images were obtained from the base of the skull through the vertex without intravenous contrast. COMPARISON:  None. FINDINGS: The ventricles and the sulci are appropriate in size for the patient's age. There is no intracranial hemorrhage. No midline shift or mass effect identified. The gray-white matter differentiation is preserved. The visualized paranasal sinuses and mastoid air cells are well aerated. The calvarium is intact. IMPRESSION: No acute intracranial pathology. Electronically Signed   By: Elgie Collard M.D.   On: 09/03/2015 01:19    EKG: Independently reviewed.  Assessment/Plan Principal Problem:   Polysubstance abuse   Polysubstance abuse -  Patient has no clinical signs or symptoms of active benzo withdrawal at this point.  He is clearly not post ictal and other than not speaking actually had no abnormal findings.  Strongly suspect that the not-speaking is psychogenic in nature.  Psych to evaluate in AM here in ED, if psych feels that this is not psychogenic in nature then may get MRI brain but having a Broca's aphasia to the point of not being able to even make sounds but able to nod and shake head in answers to questions, MAE, and have no other focal neurologic findings, guard airway including during vomiting episodes this evening; all seems  extremely unlikely.  I fear that he may have once again overdosed on something tonight, his mother is going to go search his bathroom extensively at home right now.  Please give [place] a call based on results of psych's opinion.   Hillary Bow DO [facility] Pager [phone] from 7PM-7AM  If 7AM-7PM, please contact the day physician for the patient [email or web address] Password [email or web address]  09/03/2015, 3:03 AM

## 2015-09-03 NOTE — Progress Notes (Signed)
Pt taken off NRB and placed on 6L . RN aware. RT will continue to monitor and titrate as tolerated.

## 2015-09-03 NOTE — ED Notes (Signed)
Pt dentures and clothing sent with family.

## 2015-09-03 NOTE — ED Provider Notes (Signed)
CSN: [id number]     Arrival date & time 09/02/15  2214 History   First MD Initiated Contact with Patient 09/02/15 2226     Chief Complaint  Patient presents with  . Drug Problem     (Consider location/radiation/quality/duration/timing/severity/associated sxs/prior Treatment) HPI   [name] is a 35 y.o. male who is here for evaluation of altered mental status. He is here with his mother, who was with him since hospital discharge earlier today. She states that since he got home he has been intermittently sleepy, and then began vomiting. She gave him Suboxone 8 mg at the instruction of his addictionologist, to see if it would help. His mother thinks he is going through withdrawal from "designer drugs". He had been using medication, which he obtained, from the Internet, in [place]. These medications are stated to be"4-Fluroisobutyrfentanyl (4-FIBF), 89mg  per day, and Etizolam 150-200 mg per day. The addictionologist, is Dr. Bethann Punches, phone number is [phone].   Patient's mother states that he has "unresponsive". She called an ambulance to bring him here.  At discharge today, by hospitalist after seen by psychiatry, the patient's plan was to follow-up with his addictionologist, to continue Suboxone treatment, or get treatment at a local methadone clinic.  Level V caveat- altered mental status  Past Medical History  Diagnosis Date  . Benzodiazepine abuse   . History of IBS   . Depression   . Anxiety   . Drug use    No past surgical history on file. No family history on file. Social History  Substance Use Topics  . Smoking status: Current Every Day Smoker -- 0.50 packs/day    Types: Cigarettes  . Smokeless tobacco: Not on file  . Alcohol Use: No    Review of Systems  Unable to perform ROS: Mental status change      Allergies  Compazine; Nsaids; Phenergan; and Tramadol  Home Medications   Prior to Admission medications   Medication Sig Start Date End Date  Taking? Authorizing Provider  Buprenorphine HCl-Naloxone HCl (SUBOXONE) 12-3 MG FILM Place 1 strip under the tongue 2 (two) times daily.   Yes Historical Provider, MD  clonazePAM (KLONOPIN) 0.5 MG tablet Take 1 tablet (0.5 mg total) by mouth 2 (two) times daily. 09/01/15  Yes Leroy Sea, MD  methylphenidate (RITALIN) 20 MG tablet Take 60 mg by mouth daily.   Yes Historical Provider, MD   BP 139/85 mmHg  Pulse 111  Temp(Src) 100.8 F (38.2 C) (Rectal)  Resp 40  Ht 5\' 11"  (1.803 m)  Wt 144 lb 13.5 oz (65.7 kg)  BMI 20.21 kg/m2  SpO2 90% Physical Exam  Constitutional: He appears well-developed.  He appears undernourished, an older than stated age.  HENT:  Head: Normocephalic and atraumatic.  Right Ear: External ear normal.  Left Ear: External ear normal.  Eyes: Conjunctivae and EOM are normal. Pupils are equal, round, and reactive to light. Right eye exhibits no discharge. Left eye exhibits no discharge. No scleral icterus.  Pupils reactive bilaterally, left slightly larger than right.  Neck: Normal range of motion and phonation normal. Neck supple.  Cardiovascular: Normal rate, regular rhythm and normal heart sounds.   Pulmonary/Chest: Effort normal and breath sounds normal. No respiratory distress. He has no wheezes. He has no rales. He exhibits no bony tenderness.  Abdominal: Soft. There is no tenderness.  Musculoskeletal: Normal range of motion. He exhibits no edema.  Neurological: He is alert. No cranial nerve deficit or sensory deficit. He exhibits  normal muscle tone. Coordination normal.  The patient does not speak words, but is responsive, by following commands, elevating arms and legs, independently for the duration of time that I asked him to, and following the with his eyes during the examination. There does not appear to be any nystagmus. Unable to assess for dysarthria or expressive aphasia.  Skin: Skin is warm, dry and intact.  Psychiatric:  He appears depressed   Nursing note and vitals reviewed.   ED Course  Procedures (including critical care time)  Medications  0.9 %  sodium chloride infusion ( Intravenous Transfusing/Transfer 09/03/15 0936)  enoxaparin (LOVENOX) injection 40 mg (not administered)  Buprenorphine HCl-Naloxone HCl 12-3 MG FILM 1 strip (not administered)  LORazepam (ATIVAN) injection 0.5 mg (0.5 mg Intravenous Not Given 09/03/15 1200)  sodium chloride 0.9 % bolus 1,000 mL (0 mLs Intravenous Stopped 09/03/15 0235)  LORazepam (ATIVAN) injection 1 mg (1 mg Intravenous Given 09/03/15 0752)    Patient Vitals for the past 24 hrs:  BP Temp Temp src Pulse Resp SpO2 Height Weight  09/03/15 1300 139/85 mmHg - - (!) 111 (!) 40 90 % - -  09/03/15 1200 138/87 mmHg - - (!) 112 (!) 24 100 % - -  09/03/15 1100 138/84 mmHg - - (!) 115 (!) 38 97 % - -  09/03/15 1039 (!) 154/80 mmHg (!) 100.8 F (38.2 C) Rectal (!) 36 (!) 31 100 %  (1.803 m) 144 lb 13.5 oz (65.7 kg)  09/03/15 0930 148/91 mmHg - - 115 (!) 35 95 % - -  09/03/15 0900 135/91 mmHg - - 94 (!) 35 95 % - -  09/03/15 0830 (!) 137/102 mmHg - - 93 (!) 30 96 % - -  09/03/15 0800 127/83 mmHg - - 89 25 97 % - -  09/03/15 0730 138/87 mmHg - - 95 (!) 28 92 % - -  09/03/15 0700 137/90 mmHg - - 105 (!) 27 93 % - -  09/03/15 0630 129/86 mmHg - - - 23 - - -  09/03/15 0618 124/87 mmHg 100 F (37.8 C) Rectal 102 19 (!) 85 % - -  09/03/15 0600 124/87 mmHg - - 116 26 (!) 87 % - -  09/03/15 0530 123/82 mmHg - - 115 (!) 29 (!) 84 % - -  09/03/15 0500 121/88 mmHg - - 118 22 (!) 86 % - -  09/03/15 0430 119/80 mmHg - - 109 23 92 % - -  09/03/15 0412 - - - 99 - - - -  09/03/15 0400 118/82 mmHg - - 110 26 95 % - -  09/03/15 0300 124/84 mmHg - - 77 - 94 % - -  09/03/15 0230 125/82 mmHg - - 74 23 97 % - -  09/03/15 0215 - - - 71 23 97 % - -  09/03/15 0200 130/79 mmHg - - - - - - -  09/03/15 0130 132/88 mmHg - - 70 23 99 % - -  09/03/15 0030 133/87 mmHg - - 77 19 95 % - -  09/02/15 2330 130/87  mmHg - - 84 22 97 % - -  09/02/15 2300 115/90 mmHg - - 84 22 97 % - -  09/02/15 2227 130/93 mmHg 100.1 F (37.8 C) Axillary 68 - 96 % - -  09/02/15 2218 - - - - - 97 % - -       Labs Review Labs Reviewed  CBC WITH DIFFERENTIAL/PLATELET - Abnormal; Notable for the following:  HCT 38.9 (*)    Neutro Abs 7.9 (*)    All other components within normal limits  URINALYSIS, ROUTINE W REFLEX MICROSCOPIC (NOT AT [facility]) - Abnormal; Notable for the following:    Bilirubin Urine SMALL (*)    Ketones, ur >80 (*)    All other components within normal limits  URINE RAPID DRUG SCREEN, HOSP PERFORMED - Abnormal; Notable for the following:    Benzodiazepines POSITIVE (*)    Tetrahydrocannabinol POSITIVE (*)    All other components within normal limits  HEPATIC FUNCTION PANEL - Abnormal; Notable for the following:    ALT 16 (*)    Total Bilirubin 2.0 (*)    Bilirubin, Direct 0.6 (*)    Indirect Bilirubin 1.4 (*)    All other components within normal limits  AMMONIA - Abnormal; Notable for the following:    Ammonia 40 (*)    All other components within normal limits  BLOOD GAS, ARTERIAL - Abnormal; Notable for the following:    pCO2 arterial 34.6 (*)    pO2, Arterial 213 (*)    Acid-base deficit 2.2 (*)    All other components within normal limits  MRSA PCR SCREENING  BASIC METABOLIC PANEL  ETHANOL  LIPASE, BLOOD  DRUG SCREEN 10 W/CONF, SERUM  I-STAT CG4 LACTIC ACID, ED    Imaging Review Ct Head Wo Contrast  09/03/2015  CLINICAL DATA:  35 year old male with altered mental status EXAM: CT HEAD WITHOUT CONTRAST TECHNIQUE: Contiguous axial images were obtained from the base of the skull through the vertex without intravenous contrast. COMPARISON:  None. FINDINGS: The ventricles and the sulci are appropriate in size for the patient's age. There is no intracranial hemorrhage. No midline shift or mass effect identified. The gray-white matter differentiation is preserved. The visualized  paranasal sinuses and mastoid air cells are well aerated. The calvarium is intact. IMPRESSION: No acute intracranial pathology. Electronically Signed   By: Elgie CollardArash  Radparvar M.D.   On: 09/03/2015 01:19   Dg Chest Port 1 View  09/03/2015  CLINICAL DATA:  Acute aspiration EXAM: PORTABLE CHEST 1 VIEW COMPARISON:  09/03/2015 FINDINGS: Portable exam rotated to the right. Normal heart size and vascularity. Slight increased bibasilar streaky opacities medially, suspect atelectasis. Negative for edema, effusion or pneumothorax. Trachea midline. Monitor leads overlie the chest. IMPRESSION: Mild increased bibasilar atelectasis compared to earlier today. Electronically Signed   By: Judie PetitM.  Shick M.D.   On: 09/03/2015 12:25   Dg Chest Port 1 View  09/03/2015  CLINICAL DATA:  Shortness of breath. EXAM: PORTABLE CHEST 1 VIEW COMPARISON:  Radiograph of August 29, 2015. FINDINGS: The heart size and mediastinal contours are within normal limits. Both lungs are clear. No pneumothorax or pleural effusion is noted. The visualized skeletal structures are unremarkable. IMPRESSION: No acute cardiopulmonary abnormality seen. Electronically Signed   By: Lupita RaiderJames  Green Jr, M.D.   On: 09/03/2015 07:56   I have personally reviewed and evaluated these images and lab results as part of my medical decision-making.   EKG Interpretation None      MDM   Final diagnoses:  Altered mental status, unspecified altered mental status type  Polysubstance abuse    Patient presenting for evaluation of altered mental status. On review of records, from hospitalization, with discharge today, patient was admitted 5 days ago with similar symptoms as presenting here today. He improved with expectant management, and today was clear, lucid and not in withdrawal. His altered mental status, since discharge, raises the possibility of ingestion of  illicit substances, as cause. I do not think his condition is consistent with withdrawal from narcotic or  benzodiazepine medication. The patient has been reported to use "designer drugs", which he received from [place]. Today his altered mental status, does not include inability to cooperate with exam, and move, on request. There is no significant evidence for encephalopathy, serious bacterial infection, metabolic instability or impending vascular collapse. Screening evaluation ordered, to be completed in the emergency department prior to consideration of disposition.  Nursing Notes Reviewed/ Care Coordinated Applicable Imaging Reviewed Interpretation of Laboratory Data incorporated into ED treatment   Plan- Consult [facility], to consider admission.   Mancel Bale, MD 09/03/15 1400

## 2015-09-03 NOTE — BH Assessment (Addendum)
Assessment Note  Jordan PilonBrian A Chapman is an 35 y.o. male presenting to the ED for reportedly being lethargic after taking klonopin and suboxone.  Patient was admitted for taking 8mg  of suboxone while on fentanyl analog which triggered withdrawals. Patient was discharged earlier today and reportedly went home to take medications to include Klonopin and Suboxone and became lethargic. Patient will not speak to this writer and did not respond. patient opened his eyes when the Hospitalist entered the room and was observed blinking. When the hospitalist left the room the patient did not answer questions and closed his eyes again. Patients family would like for him to be admitted to a long term substance abuse treatment which patient declines per hospitalist. Hospitalists also reports that patient has been ordering "designer drugs" from [place]. Hospitalist reports that patient will not speak but nodded his head to participate in the Hospitalists assessment. Patient UDS + Benzodiazepines and + THC.  Consulted with Maryjean Mornharles Kober, PA-C who recommends observation and AM psychiatric evaluation.   Diagnosis: Unspecified other (or unknown) substance-related disorder  Past Medical History:  Past Medical History  Diagnosis Date  . Benzodiazepine abuse   . History of IBS   . Depression   . Anxiety   . Drug use     No past surgical history on file.  Family History: No family history on file.  Social History:  reports that he has been smoking Cigarettes.  He has been smoking about 0.50 packs per day. He does not have any smokeless tobacco history on file. He reports that he uses illicit drugs (Marijuana). He reports that he does not drink alcohol.  Additional Social History:  Alcohol / Drug Use Pain Medications: UTA Prescriptions: UTA Over the Counter: UTA  CIWA: CIWA-Ar BP: 119/80 mmHg Pulse Rate: 109 COWS:    Allergies:  Allergies  Allergen Reactions  . Compazine [Prochlorperazine Edisylate] Other  (See Comments)    Blood pressure drops  . Nsaids Other (See Comments)    unknown  . Phenergan [Promethazine Hcl] Other (See Comments)    Blood pressure drops   . Tramadol Other (See Comments)    Blood pressure drops     Home Medications:  (Not in a hospital admission)  OB/GYN Status:  No LMP for male patient.  General Assessment Data Location of Assessment: [facility] ED TTS Assessment: In system Is this a Tele or Face-to-Face Assessment?: Face-to-Face Is this an Initial Assessment or a Re-assessment for this encounter?: Initial Assessment Marital status:  (UTA) Is patient pregnant?: No Pregnancy Status: No Living Arrangements:  (UTA) Can pt return to current living arrangement?:  (UTA) Admission Status: Voluntary Is patient capable of signing voluntary admission?:  (UTA)     Crisis Care Plan Living Arrangements:  (UTA) Name of Psychiatrist: UTA Name of Therapist: UTA  Education Status Is patient currently in school?:  (UTA) Highest grade of school patient has completed: UTA  Risk to self with the past 6 months Suicidal Ideation:  (UTA) Has patient been a risk to self within the past 6 months prior to admission? :  (UTA) Suicidal Intent:  (UTA) Has patient had any suicidal intent within the past 6 months prior to admission? :  (UTA) Is patient at risk for suicide?:  (UTA) Suicidal Plan?:  (UTA) Has patient had any suicidal plan within the past 6 months prior to admission? :  (UTA) Access to Means:  (UTA) What has been your use of drugs/alcohol within the last 12 months?:  (UTA) Previous Attempts/Gestures:  (  UTA) How many times?:  (UTA) Other Self Harm Risks:  (UTA) Triggers for Past Attempts:  (UTA) Intentional Self Injurious Behavior:  (UTA) Family Suicide History:  (UTA) Recent stressful life event(s):  (UTA) Persecutory voices/beliefs?:  Rich Reining) Depression:  (UTA) Depression Symptoms:  (UTA) Substance abuse history and/or treatment for substance abuse?:   (UTA) Suicide prevention information given to non-admitted patients:  (UTA)  Risk to Others within the past 6 months Homicidal Ideation:  (UTA) Does patient have any lifetime risk of violence toward others beyond the six months prior to admission? :  (UTA) Thoughts of Harm to Others:  (UTA) Current Homicidal Intent:  (UTA) Current Homicidal Plan:  (UTA) Access to Homicidal Means:  (UTA) Identified Victim:  (UTA) History of harm to others?:  (UTA) Assessment of Violence:  (UTA) Violent Behavior Description:  (UTA) Does patient have access to weapons?:  (UTA) Criminal Charges Pending?:  (UTA) Does patient have a court date:  (UTA) Is patient on probation?:  (UTA)  Psychosis Hallucinations:  (UTA) Delusions:  (UTA)  Mental Status Report Appearance/Hygiene: In hospital gown Eye Contact: Unable to Assess Motor Activity: Unable to assess Speech: Unable to assess Level of Consciousness: Unable to assess Anxiety Level: None Thought Processes: Unable to Assess Judgement: Unable to Assess Orientation: Unable to assess Obsessive Compulsive Thoughts/Behaviors: Unable to Assess  Cognitive Functioning Concentration: Unable to Assess Memory: Unable to Assess IQ:  (UTA) Insight: Unable to Assess Impulse Control: Unable to Assess Sleep: Unable to Assess Vegetative Symptoms: Unable to Assess  ADLScreening [facility] Assessment Services) Patient's cognitive ability adequate to safely complete daily activities?:  (UTA) Patient able to express need for assistance with ADLs?:  (UTA) Independently performs ADLs?:  (UTA)  Prior Inpatient Therapy Prior Inpatient Therapy:  (UTA) Prior Therapy Dates: UTA Prior Therapy Facilty/Provider(s): UTA Reason for Treatment: UTA  Prior Outpatient Therapy Prior Outpatient Therapy:  (UTA) Prior Therapy Dates: UTA Prior Therapy Facilty/Provider(s): UTA Reason for Treatment: UTA Does patient have an ACCT team?: Unknown Does patient have Intensive  In-House Services?  : Unknown Does patient have [facility] services? : Unknown Does patient have P4[facility] services?: Unknown  ADL Screening (condition at time of admission) Patient's cognitive ability adequate to safely complete daily activities?:  (UTA) Is the patient deaf or have difficulty hearing?:  (UTA) Does the patient have difficulty seeing, even when wearing glasses/contacts?:  (UTA) Does the patient have difficulty concentrating, remembering, or making decisions?:  (UTA) Patient able to express need for assistance with ADLs?:  (UTA) Does the patient have difficulty dressing or bathing?:  (UTA) Independently performs ADLs?:  (UTA) Does the patient have difficulty walking or climbing stairs?:  (UTA) Weakness of Legs:  (UTA) Weakness of Arms/Hands:  (UTA)  Home Assistive Devices/Equipment Home Assistive Devices/Equipment:  (UTA)    Abuse/Neglect Assessment (Assessment to be complete while patient is alone) Physical Abuse:  (UTA) Verbal Abuse:  (UTA) Sexual Abuse:  (UTA) Exploitation of patient/patient's resources:  (UTA) Self-Neglect:  (UTA) Possible abuse reported to::  (UTA) Values / Beliefs Cultural Requests During Hospitalization:  (UTA) Spiritual Requests During Hospitalization:  (UTA)        Additional Information 1:1 In Past 12 Months?: No CIRT Risk: No Elopement Risk: No     Disposition:  Disposition Initial Assessment Completed for this Encounter: Yes Disposition of Patient: Other dispositions (AM evaluation)  On Site Evaluation by:   Reviewed with Physician:    Jaziya Obarr 09/03/2015 5:47 AM

## 2015-09-03 NOTE — Consult Note (Signed)
PULMONARY / CRITICAL CARE MEDICINE   Name: [name] MRN: [id number] DOB: 08/26/1980    ADMISSION DATE:  09/02/2015 CONSULTATION DATE:  09/03/2015  REFERRING MD:  Dr. Julien NordmannLangeland  CHIEF COMPLAINT:  Respiratory distress.  HISTORY OF PRESENT ILLNESS:   Hx from chart.  35 yo male was admitted on 7/10 with lethargy after taking suboxone and etizolam.  He has hx of benzo, opiate, THC abuse.  He also has hx of depression, anxiety, PTSD, IBS.  He was seen by psychiatry.  He was discharged 7/13.  He returned to ER 7/14 with lethargy.  Mother reports he was getting designer drugs from [place], and was concerned he was going through withdrawal from this.  He was seen by neurology.  There was concern for aspiration.  He developed respiratory distress with hypoxia requiring intubation.  PAST MEDICAL HISTORY :  He  has a past medical history of Benzodiazepine abuse; History of IBS; Depression; Anxiety; and Drug use.  PAST SURGICAL HISTORY: He  has no past surgical history on file.  Allergies  Allergen Reactions  . Compazine [Prochlorperazine Edisylate] Other (See Comments)    Blood pressure drops  . Nsaids Other (See Comments)    unknown  . Phenergan [Promethazine Hcl] Other (See Comments)    Blood pressure drops   . Tramadol Other (See Comments)    Blood pressure drops     No current facility-administered medications on file prior to encounter.   Current Outpatient Prescriptions on File Prior to Encounter  Medication Sig  . Buprenorphine HCl-Naloxone HCl (SUBOXONE) 12-3 MG FILM Place 1 strip under the tongue 2 (two) times daily.  . clonazePAM (KLONOPIN) 0.5 MG tablet Take 1 tablet (0.5 mg total) by mouth 2 (two) times daily.  . methylphenidate (RITALIN) 20 MG tablet Take 60 mg by mouth daily.    FAMILY HISTORY:  His has no family status information on file.   SOCIAL HISTORY: He  reports that he has been smoking Cigarettes.  He has been smoking about 0.50 packs per day. He does  not have any smokeless tobacco history on file. He reports that he uses illicit drugs (Marijuana). He reports that he does not drink alcohol.  REVIEW OF SYSTEMS:   Unable to obtain.  SUBJECTIVE:   VITAL SIGNS: BP 139/85 mmHg  Pulse 111  Temp(Src) 100.8 F (38.2 C) (Rectal)  Resp 40  Ht 5\' 11"  (1.803 m)  Wt 144 lb 13.5 oz (65.7 kg)  BMI 20.21 kg/m2  SpO2 90%  HEMODYNAMICS:    VENTILATOR SETTINGS:    INTAKE / OUTPUT: I/O last 3 completed shifts: In: -  Out: 150 [Urine:150]  PHYSICAL EXAMINATION: General: ill appearing, thin Neuro:  Obtunded, moves extremities but not following commands HEENT:  Pupils pinpoint, poor dentition Cardiovascular:  Regular, tachycardic Lungs:  B/l rhonchi, no wheeze Abdomen:  Soft, + bowel sounds, non tender Musculoskeletal:  No edema, decreased muscle bulk Skin:  No rashes  LABS:  BMET  Recent Labs Lab 08/29/15 1635 08/30/15 0513 09/03/15 0034  NA 139 137 140  K 4.1 3.8 3.6  CL 107 106 102  CO2 24 24 26   BUN 16 11 11   CREATININE 0.79 0.62 0.63  GLUCOSE 147* 99 85    Electrolytes  Recent Labs Lab 08/29/15 1635 08/30/15 0513 09/03/15 0034  CALCIUM 8.1* 8.0* 9.0    CBC  Recent Labs Lab 08/29/15 1635 08/30/15 0513 09/03/15 0034  WBC 14.5* 14.1* 10.1  HGB 12.9* 11.6* 13.6  HCT 38.8* 34.3* 38.9*  PLT 214 221 293    Coag's No results for input(s): APTT, INR in the last 168 hours.  Sepsis Markers  Recent Labs Lab 08/29/15 1635 08/29/15 1921 09/03/15 0049  LATICACIDVEN 2.0* 2.1* 0.89    ABG  Recent Labs Lab 09/03/15 1120  PHART 7.414  PCO2ART 34.6*  PO2ART 213*    Liver Enzymes  Recent Labs Lab 08/29/15 1635 08/30/15 0513 09/03/15 0034  AST 22 20 35  ALT 17 16* 16*  ALKPHOS 88 84 90  BILITOT 0.5 0.2* 2.0*  ALBUMIN 3.6 3.3* 4.0    Cardiac Enzymes No results for input(s): TROPONINI, PROBNP in the last 168 hours.  Glucose  Recent Labs Lab 08/29/15 1559  GLUCAP 127*     Imaging Ct Head Wo Contrast  09/03/2015  CLINICAL DATA:  35 year old male with altered mental status EXAM: CT HEAD WITHOUT CONTRAST TECHNIQUE: Contiguous axial images were obtained from the base of the skull through the vertex without intravenous contrast. COMPARISON:  None. FINDINGS: The ventricles and the sulci are appropriate in size for the patient's age. There is no intracranial hemorrhage. No midline shift or mass effect identified. The gray-white matter differentiation is preserved. The visualized paranasal sinuses and mastoid air cells are well aerated. The calvarium is intact. IMPRESSION: No acute intracranial pathology. Electronically Signed   By: Elgie Collard M.D.   On: 09/03/2015 01:19   Dg Chest Port 1 View  09/03/2015  CLINICAL DATA:  Acute aspiration EXAM: PORTABLE CHEST 1 VIEW COMPARISON:  09/03/2015 FINDINGS: Portable exam rotated to the right. Normal heart size and vascularity. Slight increased bibasilar streaky opacities medially, suspect atelectasis. Negative for edema, effusion or pneumothorax. Trachea midline. Monitor leads overlie the chest. IMPRESSION: Mild increased bibasilar atelectasis compared to earlier today. Electronically Signed   By: Judie Petit.  Shick M.D.   On: 09/03/2015 12:25   Dg Chest Port 1 View  09/03/2015  CLINICAL DATA:  Shortness of breath. EXAM: PORTABLE CHEST 1 VIEW COMPARISON:  Radiograph of August 29, 2015. FINDINGS: The heart size and mediastinal contours are within normal limits. Both lungs are clear. No pneumothorax or pleural effusion is noted. The visualized skeletal structures are unremarkable. IMPRESSION: No acute cardiopulmonary abnormality seen. Electronically Signed   By: Lupita Raider, M.D.   On: 09/03/2015 07:56     STUDIES:  7/15 CT head >> no acute pathology  CULTURES: 7/15 Sputum >>  ANTIBIOTICS: 7/15 Unasyn >>   SIGNIFICANT EVENTS: 7/14 Admit 7/15 To ICU, VDRF  LINES/TUBES: 7/15 ETT >>   DISCUSSION: 35 yo male with hx of  benzo, opiate, THC abuse was trying to do detox readmitted with lethargy.  Developed acute hypoxic respiratory failure with concern for aspiration and transferred to ICU.  ASSESSMENT / PLAN:  PULMONARY A: Acute hypoxic respiratory failure. P:   Full vent support F/u CXR, ABG  CARDIOVASCULAR A:  SIRS/Sepsis criteria. P:  Continue IV fluids  RENAL A:   No acute issues. P:   Monitor renal fx, urine outpt, electrolytes  GASTROINTESTINAL A:   Protein calorie malnutrition. P:   Tube feeds Protonix for SUP  HEMATOLOGIC A:   No acute issues. P:  F/u CBC Lovenox for DVT prophylaxis  INFECTIOUS A:   Aspiration pneumonia. P:   Unasyn  ENDOCRINE A:   No acute issues.   P:   Monitor blood sugar on BMET   NEUROLOGIC A:   Acute encephalopathy with concern for drug withdrawal. P:   RASS goal: -1 to -2 F/u MRI brain  Updated pts mother at bedside.  CC time 42 minutes.  Coralyn Helling, MD [facility] Pulmonary/Critical Care 09/03/2015, 5:34 PM Pager:  [phone] After 3pm call: [phone]

## 2015-09-03 NOTE — Progress Notes (Signed)
Received pt from 3S already intubated prior to transfer to 50M.

## 2015-09-03 NOTE — Procedures (Signed)
Intubation Procedure Note Jordan PilonBrian A Chapman [id number] 05/17/1980  Procedure: Intubation Indications: Respiratory insufficiency  Procedure Details Consent: Risks of procedure as well as the alternatives and risks of each were explained to the (patient/caregiver).  Consent for procedure obtained. Time Out: Verified patient identification, verified procedure, site/side was marked, verified correct patient position, special equipment/implants available, medications/allergies/relevent history reviewed, required imaging and test results available.  Performed  Maximum sterile technique was used including gloves, gown, hand hygiene and mask.  MAC and 3    Evaluation Hemodynamic Status: BP stable throughout; O2 sats: currently acceptable Patient's Current Condition: stable Complications: No apparent complications Patient did tolerate procedure well. Chest X-ray ordered to verify placement.  CXR: pending.  Pt intubated due to respiratory insufficiency (spo2 74-82% on NRB, increased WOB,  and inability to clear secretions) using a 7.0 ett secured at 22 at the lip. Pt with bilateral Rhonchi/Coarse Crackles BS, positive color change on etco2, direct visualization, CXR pending. RT will continue to monitor.    Jordan ShiverKelley, [name] 09/03/2015

## 2015-09-03 NOTE — BH Assessment (Signed)
Attempted to assess patient again. Patient opened eyes for Dr. Julian ReilGardner but closed his eyes when the Doctor left the room and would not respond to questions asked verbally or by nodding his head.   Davina PokeJoVea Teiara Baria, LCSW Therapeutic Triage Specialist [facility] 09/03/2015 4:56 AM

## 2015-09-03 NOTE — Progress Notes (Signed)
Called to patiens room by mom she states patient "had a coughing spell and now can't clear it" upon entering room patents saturations were 91 but dropping, lung sound with significant rhonchi thoughout both oral and nasotracheal suctions were preformed with large amounts of oral secretions seen. Saturations continues to drop as low as 79. Respiratory, rapid response, primary MD and CCM MD called. Patient emergently intubated at beside with transfer to the ICU immediately after. Report given to receiving nurse at bedside.

## 2015-09-03 NOTE — Procedures (Signed)
Intubation Procedure Note Soundra PilonBrian A Belnap [id number] 03/26/1980  Procedure: Intubation Indications: Respiratory insufficiency  Procedure Details Consent: Risks of procedure as well as the alternatives and risks of each were explained to the (patient/caregiver).  Consent for procedure obtained. Time Out: Verified patient identification, verified procedure, site/side was marked, verified correct patient position, special equipment/implants available, medications/allergies/relevent history reviewed, required imaging and test results available.  Performed  Maximum sterile technique was used including gloves and hand hygiene.  MAC and 3  Given 4 mg versed, 100 mcg fentanyl, 20 mg etomidate, 60 mg succinylcholine.  Used glidescope.  Inserted #7 ETT to 22 cm at lip.  Confirmed with ausculation and CO2 detector.  Evaluation Hemodynamic Status: BP stable throughout; O2 sats: transiently fell during during procedure Patient's Current Condition: stable Complications: No apparent complications Patient did tolerate procedure well. Chest X-ray ordered to verify placement.  CXR: pending.   Coralyn HellingVineet Lanard Arguijo, MD [facility] Pulmonary/Critical Care 09/03/2015, 5:15 PM Pager:  [phone] After 3pm call: [phone]

## 2015-09-03 NOTE — Progress Notes (Signed)
Pharmacy Antibiotic Note  [name] is a 35 y.o. male  Here with AMS and now on the vent with possible pneumonia.  Pharmacy has been consulted for unasyn dosing. -WBC= 10.1, tmax= 100.8, CrCl > 100   Plan: -Unasyn 3gm IV q6h -Will follow renal function, cultures and clinical progress   Height: 5\' 11"  (180.3 cm) Weight: 144 lb 13.5 oz (65.7 kg) IBW/kg (Calculated) : 75.3  Temp (24hrs), Avg:100.3 F (37.9 C), Min:100 F (37.8 C), Max:100.8 F (38.2 C)   Recent Labs Lab 08/29/15 1635 08/29/15 1921 08/30/15 0513 09/03/15 0034 09/03/15 0049  WBC 14.5*  --  14.1* 10.1  --   CREATININE 0.79  --  0.62 0.63  --   LATICACIDVEN 2.0* 2.1*  --   --  0.89    Estimated Creatinine Clearance: 122 mL/min (by C-G formula based on Cr of 0.63).    Allergies  Allergen Reactions  . Compazine [Prochlorperazine Edisylate] Other (See Comments)    Blood pressure drops  . Nsaids Other (See Comments)    unknown  . Phenergan [Promethazine Hcl] Other (See Comments)    Blood pressure drops   . Tramadol Other (See Comments)    Blood pressure drops     Antimicrobials this admission: 7/15 unasyn  Microbiology results: 7/10 blood- neg 7/10 urine- neg   Thank you for allowing pharmacy to be a part of this patient's care.  Harland GermanAndrew Alorah Mcree, Pharm D 09/03/2015 5:33 PM

## 2015-09-03 NOTE — ED Provider Notes (Addendum)
PT was initially seen by Dr. Effie ShyWentz.  Awaiting medical consult.  ? Whether pt's behavior is medical vs psych.  Initially, Dr. Julian ReilGardner felt pt was more consistent with a psychiatric problem vs medical.  I reassessed pt at 0620 and at this time, pt is tachycardic with clonus, hypoxia with sats 85% on RA.  Rectal temp 100.  Lactate was previously checked and normal.  Will recheck CXR.  Dr. Julian ReilGardner will admit.  Rolan BuccoMelanie Quenton Recendez, MD 09/03/15 [id number]  Rolan BuccoMelanie Manaal Mandala, MD 09/03/15 [phone]

## 2015-09-03 NOTE — Progress Notes (Signed)
RN called RT to pt's room as he had a coughing spell and spo2 dropped to 74%. Pt NT suctioned x 3 by RN and RT with very little secretions removed. Pt placed on NRB with no improvement. Pt began to have significant increased WOB. CCM e-link notified and CCM MD to bedside. Pt intubated by CCM MD with no complications. Pt manually ventilated and transported from 3S01 to 2M14. Pt difficult to ventilate on transport as he was coughing with copious amounts of yellow thick/frothy secretions coming up ETT. Bedside report given to receiving RT. RT will closely monitor pt.

## 2015-09-03 NOTE — Progress Notes (Signed)
EEG canceled at bedside per Dr Hilda BladesArmstrong, neurology.

## 2015-09-03 NOTE — ED Notes (Signed)
Pt respirations increased and oxygen sats decreased to 88-89%.  Pt sounds airway congested in upper airway.  Notified MD.  Bedside suction provided by respiratory.  Large amount of mucous extracted.  Pt resp 25 and sats 91-92 % on 2l [place] at this time.  Notified MD team 6 for [facility] and changed bed to stepdown for change in pt status.

## 2015-09-03 NOTE — Progress Notes (Addendum)
Called by our RN for patient's arrival. Patient seen in outside hospital by Dr. Magdalen SpatzJered Gardner.  At outside hospital, patient was alert, noddingl yes and no to questions, and with peripheral clonus. Please see admission H&P.  On transfer, the patient's sats were in the low 80s was placed on nonrebreather and currently on nonrebreather with sats of 100% after he apparently coughed. Of note, he is minimally responsive now to pain. But does not open his eyes and does not respond to verbal questioning.  1. Query aspiration pneumonia pneumonitis with respiratory distress: - Will check an ABG, Nonrebreather right now. May need BiPAP depending on the ABG,  appears comfortable and not in acute resp distress. -  Does not require emergent intubation at this time  2. Drug od w/ ams. - MRI of the brain pending - EEG initially ordred, canceled ny Neuro. No sz findings - spoke w/ neurology, Dr. Hilda BladesArmstrong, who is here at bedside.  Will follow up with his recommendations, appreciated his assistance   - rounded w/ rn   1203pm abg noted, dw rn, will reduce o2 on nrb, wean off as able cxr noted, no acute findings   Paged by rn at 445pm; pt in resp distress after nonproductive coughing spell.  Mom at bedside.  Pt currently tachycardic, tachypneac, in acute resp distress  dw Pulm cm MD for emergent intubation. Now DR Craige CottaSood, PCCM at bedside as well. Pt to be trx to icu for further care after intubation. Appreciate all assistance.

## 2015-09-03 NOTE — BH Assessment (Signed)
Attempted to assess patient alone. Patient did not answer any questions. Patient did not make eye contact. Patient laid in the bed without responding to this Clinical research associatewriter. EDP informed.  Davina PokeJoVea Gerard Bonus, LCSW Therapeutic Triage Specialist [facility] 09/03/2015 4:41 AM

## 2015-09-03 NOTE — Progress Notes (Signed)
Patient remains alert, nodding yes or no to questions, does have peripheral clonus.  Now has mild tachycardia, and new O2 requirement with crackles in lung that were not present during my prior exam.  Still able to nod head yes / no to questions.  I suspect he may have taken something beyond these known very short acting drugs of abuse.  Plan: 1) admit to tele at [facility] 2) consult neurology: spoke with Dr. Roseanne RenoStewart 3) MRI brain 4) EEG

## 2015-09-03 NOTE — Consult Note (Signed)
NEURO HOSPITALIST CONSULT NOTE   Requestig physician: Dr. Julien Nordmann   Reason for Consult:  Altered mental status   History obtained from:  Chart    [name] is a 35 year-old patient who presents as a transfer due to altered mental status.  He has an unfortunate known history of recreational drug use.  The notes indicate recent drug use and addiction.  His mother reports recently administering suboxone.  Due to declining mental status he was transferred to [facility].  HPI:                                                                                                                                          [name] is an 35 y.o. male   Past Medical History  Diagnosis Date  . Benzodiazepine abuse   . History of IBS   . Depression   . Anxiety   . Drug use     No past surgical history on file.  No family history on file.  Family History:   Social History:  reports that he has been smoking Cigarettes.  He has been smoking about 0.50 packs per day. He does not have any smokeless tobacco history on file. He reports that he uses illicit drugs (Marijuana). He reports that he does not drink alcohol.  Allergies  Allergen Reactions  . Compazine [Prochlorperazine Edisylate] Other (See Comments)    Blood pressure drops  . Nsaids Other (See Comments)    unknown  . Phenergan [Promethazine Hcl] Other (See Comments)    Blood pressure drops   . Tramadol Other (See Comments)    Blood pressure drops     MEDICATIONS:                                                                                                                     I have reviewed the patient's current medications.   ROS:  History obtained from chart review  General ROS: negative for - chills, fatigue, fever, night sweats, weight gain or weight loss Psychological  ROS: negative for - behavioral disorder, hallucinations, memory difficulties, mood swings or suicidal ideation Ophthalmic ROS: negative for - blurry vision, double vision, eye pain or loss of vision ENT ROS: negative for - epistaxis, nasal discharge, oral lesions, sore throat, tinnitus or vertigo Allergy and Immunology ROS: negative for - hives or itchy/watery eyes Hematological and Lymphatic ROS: negative for - bleeding problems, bruising or swollen lymph nodes Endocrine ROS: negative for - galactorrhea, hair pattern changes, polydipsia/polyuria or temperature intolerance Respiratory ROS: negative for - cough, hemoptysis, shortness of breath or wheezing Cardiovascular ROS: negative for - chest pain, dyspnea on exertion, edema or irregular heartbeat Gastrointestinal ROS: negative for - abdominal pain, diarrhea, hematemesis, nausea/vomiting or stool incontinence Genito-Urinary ROS: negative for - dysuria, hematuria, incontinence or urinary frequency/urgency Musculoskeletal ROS: negative for - joint swelling or muscular weakness Neurological ROS: as noted in HPI Dermatological ROS: negative for rash and skin lesion changes   Blood pressure 154/80, pulse 36, temperature 100.8 F (38.2 C), temperature source Rectal, resp. rate 31, height 5\' 11"  (1.803 m), weight 65.7 kg (144 lb 13.5 oz), SpO2 100 %.   Neurologic Examination:                                                                                                      HEENT-  Normocephalic, no lesions, without obvious abnormality.  Normal external eye and conjunctiva.  Normal TM's bilaterally.  Normal auditory canals and external ears. Normal external nose, mucus membranes and septum.  Normal pharynx. Cardiovascular- regular rate and rhythm, S1, S2 normal, no murmur, click, rub or gallop, pulses palpable throughout   Lungs- chest clear, no wheezing, rales, normal symmetric air entry, Heart exam - S1, S2 normal, no murmur, no gallop, rate  regular Abdomen- soft, non-tender; bowel sounds normal; no masses,  no organomegaly Extremities- less then 2 second capillary refill Lymph-no adenopathy palpable Musculoskeletal-no joint tenderness, deformity or swelling Skin-warm and dry, no hyperpigmentation, vitiligo, or suspicious lesions  Neurological Examination Mental Status: Lethargic, not speaking, but shaking head yes and no to questions, following commands in all four extremities. Cranial Nerves: II: Discs flat bilaterally; Visual fields grossly normal, pupils equal, round, reactive to light and accommodation III,IV, VI: ptosis not present, extra-ocular motions intact bilaterally V,VII: smile symmetric, facial light touch sensation normal bilaterally VIII: hearing normal bilaterally IX,X: uvula rises symmetrically XI: bilateral shoulder shrug XII: midline tongue extension Motor: Anti-gravity in all four extremities. Tone and bulk:normal tone throughout; no atrophy noted Sensory: grossly intact Deep Tendon Reflexes: 2+ and symmetric throughout Plantars: Right: downgoing   Left: downgoing Cerebellar: Not reliable at present Gait: not tested      Lab Results: Basic Metabolic Panel:  Recent Labs Lab 08/29/15 1635 08/30/15 0513 09/03/15 0034  NA 139 137 140  K 4.1 3.8 3.6  CL 107 106 102  CO2 24 24 26   GLUCOSE 147* 99 85  BUN 16 11 11   CREATININE 0.79 0.62 0.63  CALCIUM 8.1* 8.0* 9.0    Liver Function Tests:  Recent Labs Lab 08/29/15 1635 08/30/15 0513 09/03/15 0034  AST 22 20 35  ALT 17 16* 16*  ALKPHOS 88 84 90  BILITOT 0.5 0.2* 2.0*  PROT 7.0 6.6 8.0  ALBUMIN 3.6 3.3* 4.0    Recent Labs Lab 09/03/15 0034  LIPASE 29    Recent Labs Lab 09/03/15 0034  AMMONIA 40*    CBC:  Recent Labs Lab 08/29/15 1635 08/30/15 0513 09/03/15 0034  WBC 14.5* 14.1* 10.1  NEUTROABS  --  11.6* 7.9*  HGB 12.9* 11.6* 13.6  HCT 38.8* 34.3* 38.9*  MCV 89.4 88.9 85.9  PLT 214 221 293    Cardiac  Enzymes: No results for input(s): CKTOTAL, CKMB, CKMBINDEX, TROPONINI in the last 168 hours.  Lipid Panel: No results for input(s): CHOL, TRIG, HDL, CHOLHDL, VLDL, LDLCALC in the last 168 hours.  CBG:  Recent Labs Lab 08/29/15 1559  GLUCAP 127*    Microbiology: Results for orders placed or performed during the hospital encounter of 08/29/15  Urine culture     Status: None   Collection Time: 08/29/15  4:53 PM  Result Value Ref Range Status   Specimen Description URINE, CATHETERIZED  Final   Special Requests NONE  Final   Culture NO GROWTH Performed at [facility]   Final   Report Status 08/31/2015 FINAL  Final  Blood culture (routine x 2)     Status: None (Preliminary result)   Collection Time: 08/29/15  7:15 PM  Result Value Ref Range Status   Specimen Description RIGHT ANTECUBITAL BLOOD  Final   Special Requests BOTTLES DRAWN AEROBIC AND ANAEROBIC 5CC  Final   Culture   Final    NO GROWTH 4 DAYS Performed at [facility]    Report Status PENDING  Incomplete  Blood culture (routine x 2)     Status: None (Preliminary result)   Collection Time: 08/29/15  7:21 PM  Result Value Ref Range Status   Specimen Description BLOOD RIGHT FOREARM  Final   Special Requests BOTTLES DRAWN AEROBIC AND ANAEROBIC 5CC  Final   Culture   Final    NO GROWTH 4 DAYS Performed at [facility]    Report Status PENDING  Incomplete    Coagulation Studies: No results for input(s): LABPROT, INR in the last 72 hours.  Imaging: Ct Head Wo Contrast  09/03/2015  CLINICAL DATA:  35 year old male with altered mental status EXAM: CT HEAD WITHOUT CONTRAST TECHNIQUE: Contiguous axial images were obtained from the base of the skull through the vertex without intravenous contrast. COMPARISON:  None. FINDINGS: The ventricles and the sulci are appropriate in size for the patient's age. There is no intracranial hemorrhage. No midline shift or mass effect identified. The gray-white matter  differentiation is preserved. The visualized paranasal sinuses and mastoid air cells are well aerated. The calvarium is intact. IMPRESSION: No acute intracranial pathology. Electronically Signed   By: Elgie CollardArash  Radparvar M.D.   On: 09/03/2015 01:19   Dg Chest Port 1 View  09/03/2015  CLINICAL DATA:  Shortness of breath. EXAM: PORTABLE CHEST 1 VIEW COMPARISON:  Radiograph of August 29, 2015. FINDINGS: The heart size and mediastinal contours are within normal limits. Both lungs are clear. No pneumothorax or pleural effusion is noted. The visualized skeletal structures are unremarkable. IMPRESSION: No acute cardiopulmonary abnormality seen. Electronically Signed   By: Lupita RaiderJames  Green Jr, M.D.   On: 09/03/2015 07:56    Assessment/Plan:  Niko presents with AMS which currently appears to be gradually improving.  He is presently following commands in all four extremities.  He still seems a bit confused and lethargic.  There are no clear focal neurological deficits on exam OKN testing is WNL.  It is suspected the AMS is mainly due to sedative recreational drug use.  Other endocrine / metabolic / infectious disorders remain in the ddx, yet seem less likely at present.  The EEG was cancelled as Maikol is clearly not experiencing seizure activity at this time.  CT was WNL.  If Gasper continues to improve to baseline, we may want to hold on the MRI give the non-focal neurological exam.    Fayrene Fearing A. Hilda Blades, M.D. Neurohospitalist Phone: [phone]  09/03/2015, 11:17 AM

## 2015-09-04 ENCOUNTER — Inpatient Hospital Stay (HOSPITAL_COMMUNITY): Payer: Medicaid Other

## 2015-09-04 ENCOUNTER — Encounter (HOSPITAL_COMMUNITY): Payer: Self-pay | Admitting: *Deleted

## 2015-09-04 DIAGNOSIS — J9601 Acute respiratory failure with hypoxia: Secondary | ICD-10-CM

## 2015-09-04 LAB — CBC
HEMATOCRIT: 35.8 % — AB (ref 39.0–52.0)
HEMOGLOBIN: 12.1 g/dL — AB (ref 13.0–17.0)
MCH: 29.4 pg (ref 26.0–34.0)
MCHC: 33.8 g/dL (ref 30.0–36.0)
MCV: 86.9 fL (ref 78.0–100.0)
Platelets: 251 10*3/uL (ref 150–400)
RBC: 4.12 MIL/uL — AB (ref 4.22–5.81)
RDW: 12.6 % (ref 11.5–15.5)
WBC: 19.1 10*3/uL — AB (ref 4.0–10.5)

## 2015-09-04 LAB — POCT I-STAT 3, ART BLOOD GAS (G3+)
ACID-BASE EXCESS: 3 mmol/L — AB (ref 0.0–2.0)
BICARBONATE: 26.9 meq/L — AB (ref 20.0–24.0)
Bicarbonate: 24.6 mEq/L — ABNORMAL HIGH (ref 20.0–24.0)
O2 SAT: 97 %
O2 SAT: 21 %
PCO2 ART: 38.8 mmHg (ref 35.0–45.0)
PCO2 ART: 39.7 mmHg (ref 35.0–45.0)
PO2 ART: 89 mmHg (ref 80.0–100.0)
Patient temperature: 98.7
TCO2: 28 mmol/L (ref 0–100)
TCO2: 26 mmol/L (ref 0–100)
pH, Arterial: 7.449 (ref 7.350–7.450)
pH, Arterial: 7.402 (ref 7.350–7.450)
pO2, Arterial: 16 mmHg — CL (ref 80.0–100.0)

## 2015-09-04 LAB — COMPREHENSIVE METABOLIC PANEL
ALBUMIN: 2.7 g/dL — AB (ref 3.5–5.0)
ALT: 16 U/L — ABNORMAL LOW (ref 17–63)
ANION GAP: 8 (ref 5–15)
AST: 25 U/L (ref 15–41)
Alkaline Phosphatase: 70 U/L (ref 38–126)
BILIRUBIN TOTAL: 1.1 mg/dL (ref 0.3–1.2)
BUN: 12 mg/dL (ref 6–20)
CO2: 26 mmol/L (ref 22–32)
Calcium: 8.4 mg/dL — ABNORMAL LOW (ref 8.9–10.3)
Chloride: 107 mmol/L (ref 101–111)
Creatinine, Ser: 0.64 mg/dL (ref 0.61–1.24)
GFR calc Af Amer: >60 mL/min (ref >60)
Glucose, Bld: 97 mg/dL (ref 65–99)
POTASSIUM: 2.6 mmol/L — AB (ref 3.5–5.1)
Sodium: 141 mmol/L (ref 135–145)
TOTAL PROTEIN: 6.1 g/dL — AB (ref 6.5–8.1)

## 2015-09-04 LAB — MAGNESIUM: Magnesium: 1.7 mg/dL (ref 1.7–2.4)

## 2015-09-04 LAB — GLUCOSE, CAPILLARY
GLUCOSE-CAPILLARY: 101 mg/dL — AB (ref 65–99)
GLUCOSE-CAPILLARY: 124 mg/dL — AB (ref 65–99)
GLUCOSE-CAPILLARY: 150 mg/dL — AB (ref 65–99)
Glucose-Capillary: 111 mg/dL — ABNORMAL HIGH (ref 65–99)
Glucose-Capillary: 116 mg/dL — ABNORMAL HIGH (ref 65–99)
Glucose-Capillary: 89 mg/dL (ref 65–99)

## 2015-09-04 LAB — PHOSPHORUS: PHOSPHORUS: 1.9 mg/dL — AB (ref 2.5–4.6)

## 2015-09-04 MED ORDER — PROPOFOL 1000 MG/100ML IV EMUL
5.0000 ug/kg/min | INTRAVENOUS | Status: DC
  Administered 2015-09-05: 20 ug/kg/min via INTRAVENOUS
  Administered 2015-09-05: 50 ug/kg/min via INTRAVENOUS
  Administered 2015-09-05: 20.021 ug/kg/min via INTRAVENOUS
  Administered 2015-09-05: 50 ug/kg/min via INTRAVENOUS
  Administered 2015-09-06: 20 ug/kg/min via INTRAVENOUS
  Filled 2015-09-04 (×5): qty 100

## 2015-09-04 MED ORDER — MIDAZOLAM HCL 2 MG/2ML IJ SOLN
INTRAMUSCULAR | Status: AC
  Administered 2015-09-04: 3 mg via INTRAVENOUS
  Filled 2015-09-04: qty 4

## 2015-09-04 MED ORDER — MIDAZOLAM HCL 2 MG/2ML IJ SOLN
INTRAMUSCULAR | Status: AC
  Filled 2015-09-04: qty 2

## 2015-09-04 MED ORDER — SODIUM CHLORIDE 0.9 % IV SOLN
0.0000 ug/h | INTRAVENOUS | Status: DC
  Administered 2015-09-05: 25 ug/h via INTRAVENOUS
  Administered 2015-09-05: 50 ug/h via INTRAVENOUS
  Administered 2015-09-05: 300 ug/h via INTRAVENOUS
  Filled 2015-09-04 (×2): qty 50

## 2015-09-04 MED ORDER — FENTANYL BOLUS VIA INFUSION
50.0000 ug | INTRAVENOUS | Status: DC | PRN
  Administered 2015-09-06: 50 ug via INTRAVENOUS
  Filled 2015-09-04: qty 100

## 2015-09-04 MED ORDER — CHLORHEXIDINE GLUCONATE 0.12 % MT SOLN
15.0000 mL | Freq: Two times a day (BID) | OROMUCOSAL | Status: DC
  Administered 2015-09-04: 15 mL via OROMUCOSAL

## 2015-09-04 MED ORDER — PROPOFOL BOLUS VIA INFUSION
100.0000 mg | Freq: Once | INTRAVENOUS | Status: AC
  Administered 2015-09-04: 100 mg via INTRAVENOUS

## 2015-09-04 MED ORDER — SUCCINYLCHOLINE CHLORIDE 20 MG/ML IJ SOLN
70.0000 mg | Freq: Once | INTRAMUSCULAR | Status: AC
  Administered 2015-09-04: 70 mg via INTRAVENOUS

## 2015-09-04 MED ORDER — DEXTROSE 5 % IV SOLN
20.0000 mmol | Freq: Once | INTRAVENOUS | Status: AC
  Administered 2015-09-04: 20 mmol via INTRAVENOUS
  Filled 2015-09-04: qty 6.67

## 2015-09-04 MED ORDER — MIDAZOLAM HCL 2 MG/2ML IJ SOLN
3.0000 mg | Freq: Once | INTRAMUSCULAR | Status: AC
  Administered 2015-09-04: 3 mg via INTRAVENOUS

## 2015-09-04 MED ORDER — EPINEPHRINE HCL 0.1 MG/ML IJ SOSY
PREFILLED_SYRINGE | INTRAMUSCULAR | Status: AC
  Filled 2015-09-04: qty 10

## 2015-09-04 MED ORDER — FENTANYL CITRATE (PF) 100 MCG/2ML IJ SOLN: 100.0000 ug | Freq: Once | INTRAMUSCULAR | Status: DC

## 2015-09-04 MED ORDER — FENTANYL CITRATE (PF) 100 MCG/2ML IJ SOLN
INTRAMUSCULAR | Status: AC
  Administered 2015-09-04: 200 ug via INTRAVENOUS
  Filled 2015-09-04: qty 4

## 2015-09-04 MED ORDER — LORAZEPAM 2 MG/ML IJ SOLN
2.0000 mg | INTRAMUSCULAR | Status: DC | PRN
  Administered 2015-09-04 – 2015-09-08 (×3): 2 mg via INTRAVENOUS
  Filled 2015-09-04 (×3): qty 1

## 2015-09-04 MED ORDER — CETYLPYRIDINIUM CHLORIDE 0.05 % MT LIQD: 7.0000 mL | Freq: Two times a day (BID) | OROMUCOSAL | Status: DC

## 2015-09-04 MED ORDER — NOREPINEPHRINE BITARTRATE 1 MG/ML IV SOLN
0.0000 ug/min | INTRAVENOUS | Status: DC
  Filled 2015-09-04: qty 4

## 2015-09-04 MED ORDER — HYDROMORPHONE HCL 1 MG/ML IJ SOLN: 1.0000 mg | INTRAMUSCULAR | Status: DC | PRN

## 2015-09-04 MED ORDER — FENTANYL CITRATE (PF) 100 MCG/2ML IJ SOLN
200.0000 ug | Freq: Once | INTRAMUSCULAR | Status: AC
Start: 2015-09-04 — End: 2015-09-04
  Administered 2015-09-04: 200 ug via INTRAVENOUS

## 2015-09-04 MED ORDER — ETOMIDATE 2 MG/ML IV SOLN
20.0000 mg | Freq: Once | INTRAVENOUS | Status: AC
  Administered 2015-09-04: 20 mg via INTRAVENOUS

## 2015-09-04 MED ORDER — FENTANYL CITRATE (PF) 100 MCG/2ML IJ SOLN
INTRAMUSCULAR | Status: AC
  Filled 2015-09-04: qty 2

## 2015-09-04 MED ORDER — ROCURONIUM BROMIDE 50 MG/5ML IV SOLN
100.0000 mg | Freq: Once | INTRAVENOUS | Status: AC
  Administered 2015-09-04: 100 mg via INTRAVENOUS

## 2015-09-04 NOTE — Progress Notes (Signed)
Subjective:  [name] is awake and alert, answering questions, following commands.  He removed his ETT around 1am.  However, he appears to be breathing on his own without difficulty.  Bilateral rales are noted with increased temp and WBC.  Possible aspiration pneumonia.  He reports no headache or neck pain to suggest other infection such as meningitis.  Exam: Filed Vitals:   09/04/15 0630 09/04/15 0700  BP:  129/87  Pulse: 93 93  Temp:    Resp: 33 30    HEENT-  Normocephalic, no lesions, without obvious abnormality.  Normal external eye and conjunctiva.  Normal TM's bilaterally.  Normal auditory canals and external ears. Normal external nose, mucus membranes and septum.  Normal pharynx. Cardiovascular- regular rate and rhythm, S1, S2 normal, no murmur, click, rub or gallop, pulses palpable throughout   Lungs- abnormal lung sounds clear with coughing, Heart exam - S1, S2 normal, no murmur, no gallop, rate regular Abdomen- soft, non-tender; bowel sounds normal; no masses,  no organomegaly Extremities- less then 2 second capillary refill Lymph-no adenopathy palpable Musculoskeletal-no joint tenderness, deformity or swelling Skin-warm and dry, no hyperpigmentation, vitiligo, or suspicious lesions    Gen: In bed, NAD MS: awake, alert, follows commands, answers questions YQ:MVHQIOCN:intact Motor: mae Sensory:intact DTR:wnl  Pertinent Labs/Diagnostics: reviewed    Impression:   [name] appears neurologically intact.  The neurological exam is normal and without focal neurological deficits.  He may have developed an aspiration pneumonia.  This is already being managed.     Recommendations:  1) Neurology will sign off at this time.  Please contact us with any new issues.   Kaya Klausing A. Hilda BladesArmstrong, M.D. Neurohospitalist Phone: [phone]   09/04/2015, 8:02 AM

## 2015-09-04 NOTE — Procedures (Signed)
INTUBATION PROCEDURE NOTE  Indication: Acute hypoxemic respiratory failure after self extubation Consent: Emergent Time Out: no Medications: Etomidate 20mg , Succinylcholine 70mg ,  Paralytic/RSI: yes Technique: DL Blade: MAC 3 Cords Visualized: yes View: Grade 1 # of attempts: 1 Tube confirmation:   Chest rise: yes  Bilateral Breath Sounds: yes  Color change on CO2 detector: yes  ETCO2: yes  CXR: Tube appropriate Successful placement: yes    Galvin Profferaniel Sruti Ayllon, DO., MS [place]

## 2015-09-04 NOTE — Progress Notes (Signed)
eLink Physician-Brief Progress Note Patient Name: Jordan PilonBrian A Chapman DOB: 01/08/1981 MRN: [id number]   Date of Service  09/04/2015  HPI/Events of Note  Sudden desaturation requiring intubation.    eICU Interventions  Suspect has not handled secretions well and collapsed lung     Intervention Category Major Interventions: Hypoxemia - evaluation and management  Henry RusselSMITH, Konstantinos Cordoba, P 09/04/2015, 8:16 PM

## 2015-09-04 NOTE — Progress Notes (Signed)
eLink Physician-Brief Progress Note Patient Name: Jordan PilonBrian A Pendley DOB: 03/08/1980 MRN: [id number]   Date of Service  09/04/2015  HPI/Events of Note  Patient with severe hypoxemia post intubation despite 100% FIO2 and significant PEEP.  CXR revealed significant collapse of left lung.  We placed patient right side down and performed bag valve mask ventilation.  Bedside bronch was performed urgently.  Patient became hypotensive and vasopressors started.  Oxygen sat has now recovered to 100%.  eICU Interventions  CVC to be placed Repeat cxr post cvc Regular nebs and chest PT Patient is critically ill     Intervention Category Major Interventions: Hypoxemia - evaluation and management  Henry RusselSMITH, Sierria Bruney, P 09/04/2015, 8:49 PM

## 2015-09-04 NOTE — Progress Notes (Addendum)
Patient awakened abruptly, became anxious, and despite bilateral soft wrist restraints, fentanyl gtt @ 200 mcg/hr and Propofol @ 40 mcg/kg/hr, he was able to lean forward far enough to grab ET tube and self extubate. Respiratory therapist Diontay Marcello Mooressaac notified, patient placed on a NRB facemask, and sedation (Fentanyl and Propofol) and tube feeding immediately turned off. Patient currently following commands, A&Ox3, but confused to place [facility] Dr Deterding notified. Will continue to monitor closely.

## 2015-09-04 NOTE — Progress Notes (Signed)
Patient tachypneic in the 40's and tachy with HR in 1 teens. Ativan given. MD notified of status.

## 2015-09-04 NOTE — Procedures (Signed)
CENTRAL VENOUS CATHETER INSERTION   Indication: Vascular acess Consent: Emergent Time out: yes Appropriate position: yes Hand washing: yes Patient Sterilized and Draped: yes Location: Left IJ   # of Attempts: 1 (Failed Left subclavian attempt x3) Ultrasound Guidance: yes Wire Confirmed with US: yes Insertion depth: 20 cm All Ports Draw and flush: yes CXR:   Pneumothorax: no  Line position appropriate: yes Line sutured in place: yes EBL: 5 cc Complications: no Patient Tolerated Procedure Well: yes     Jordan Profferaniel Christee Mervine, DO., MS [place]

## 2015-09-04 NOTE — Progress Notes (Signed)
PULMONARY / CRITICAL CARE MEDICINE   Name: [name] MRN: [id number] DOB: 02/03/1981    ADMISSION DATE:  09/02/2015 CONSULTATION DATE:  09/03/2015  REFERRING MD:  Dr. Julien NordmannLangeland  CHIEF COMPLAINT:  Respiratory distress.  SUBJECTIVE:  Self extubated last night.  VITAL SIGNS: BP 127/74 mmHg  Pulse 84  Temp(Src) 98.7 F (37.1 C) (Oral)  Resp 21  Ht 5\' 11"  (1.803 m)  Wt 141 lb 5 oz (64.1 kg)  BMI 19.72 kg/m2  SpO2 99%  INTAKE / OUTPUT: I/O last 3 completed shifts: In: 2132 [I.V.:1488.7; NG/GT:343.3; IV Piggyback:300] Out: 901 [Urine:641; Emesis/NG output:260]  PHYSICAL EXAMINATION: General: ill appearing, thin Neuro: somnolent, non verbal, follows simple commands HEENT: no stridor Cardiovascular:  Regular Lungs:  B/l rhonchi, no wheeze Abdomen:  Soft, + bowel sounds, non tender Musculoskeletal:  No edema, decreased muscle bulk Skin:  No rashes  LABS:  BMET  Recent Labs Lab 08/30/15 0513 09/03/15 0034 09/04/15 0605  NA 137 140 141  K 3.8 3.6 2.6*  CL 106 102 107  CO2 24 26 26   BUN 11 11 12   CREATININE 0.62 0.63 0.64  GLUCOSE 99 85 97    Electrolytes  Recent Labs Lab 08/30/15 0513 09/03/15 0034 09/03/15 1816 09/04/15 0605  CALCIUM 8.0* 9.0  --  8.4*  MG  --   --  1.7 1.7  PHOS  --   --  3.8 1.9*    CBC  Recent Labs Lab 08/30/15 0513 09/03/15 0034 09/04/15 0605  WBC 14.1* 10.1 19.1*  HGB 11.6* 13.6 12.1*  HCT 34.3* 38.9* 35.8*  PLT 221 293 251    Coag's No results for input(s): APTT, INR in the last 168 hours.  Sepsis Markers  Recent Labs Lab 08/29/15 1635 08/29/15 1921 09/03/15 0049  LATICACIDVEN 2.0* 2.1* 0.89    ABG  Recent Labs Lab 09/03/15 1120 09/03/15 1808  PHART 7.414 7.345*  PCO2ART 34.6* 37.1  PO2ART 213* 99.0    Liver Enzymes  Recent Labs Lab 08/30/15 0513 09/03/15 0034 09/04/15 0605  AST 20 35 25  ALT 16* 16* 16*  ALKPHOS 84 90 70  BILITOT 0.2* 2.0* 1.1  ALBUMIN 3.3* 4.0 2.7*    Cardiac  Enzymes No results for input(s): TROPONINI, PROBNP in the last 168 hours.  Glucose  Recent Labs Lab 08/29/15 1559 09/03/15 1746 09/03/15 2035 09/04/15 0016 09/04/15 0424 09/04/15 0836  GLUCAP 127* 86 83 124* 101* 111*    Imaging Dg Chest Port 1 View  09/03/2015  CLINICAL DATA:  35 year old male with respiratory distress and hypoxia requiring intubation. EXAM: PORTABLE CHEST 1 VIEW COMPARISON:  09/03/2015 and prior exams. FINDINGS: An endotracheal tube is identified with tip 5.6 cm above the carina. An NG tube is noted with tip overlying the proximal stomach. Bibasilar opacities are present. There is no evidence of pneumothorax. IMPRESSION: Endotracheal tube and NG tube placement as described. Bibasilar opacities -question atelectasis versus airspace disease. Electronically Signed   By: Harmon PierJeffrey  Hu M.D.   On: 09/03/2015 18:26   Dg Chest Port 1 View  09/03/2015  CLINICAL DATA:  Acute aspiration EXAM: PORTABLE CHEST 1 VIEW COMPARISON:  09/03/2015 FINDINGS: Portable exam rotated to the right. Normal heart size and vascularity. Slight increased bibasilar streaky opacities medially, suspect atelectasis. Negative for edema, effusion or pneumothorax. Trachea midline. Monitor leads overlie the chest. IMPRESSION: Mild increased bibasilar atelectasis compared to earlier today. Electronically Signed   By: Judie PetitM.  Shick M.D.   On: 09/03/2015 12:25     STUDIES:  7/15 CT head >> no acute pathology  CULTURES: 7/15 Sputum >>  ANTIBIOTICS: 7/15 Unasyn >>   SIGNIFICANT EVENTS: 7/14 Admit 7/15 To ICU, VDRF  LINES/TUBES: 7/15 ETT >> 7/16 (self extubated)  DISCUSSION: 35 yo male with hx of benzo, opiate, THC abuse was trying to do detox readmitted with lethargy.  Developed acute hypoxic respiratory failure with concern for aspiration and transferred to ICU.  ASSESSMENT / PLAN:  PULMONARY A: Acute hypoxic respiratory failure. P:   Oxygen to keep SpO2 > 92% >> might need re intubation F/u  CXR  CARDIOVASCULAR A:  SIRS/Sepsis criteria. P:  Continue IV fluids  RENAL A:   Hypokalemia, hypophosphatemia. P:   Monitor renal fx, urine outpt, electrolytes  GASTROINTESTINAL A:   Protein calorie malnutrition. P:   NPO Swallow eval when mental status better Protonix for SUP  HEMATOLOGIC A:   Leukocytosis. P:  F/u CBC Lovenox for DVT prophylaxis  INFECTIOUS A:   Aspiration pneumonia. P:   Unasyn day 2  ENDOCRINE A:   No acute issues.   P:   Monitor blood sugar on BMET   NEUROLOGIC A:   Acute encephalopathy with concern for drug withdrawal. P:   Monitor mental status  Updated pts mother at bedside.  CC time 32 minutes.  Coralyn Helling, MD [facility] Pulmonary/Critical Care 09/04/2015, 9:15 AM Pager:  [phone] After 3pm call: [phone]

## 2015-09-05 ENCOUNTER — Inpatient Hospital Stay (HOSPITAL_COMMUNITY): Payer: Medicaid Other

## 2015-09-05 DIAGNOSIS — J9811 Atelectasis: Secondary | ICD-10-CM | POA: Insufficient documentation

## 2015-09-05 DIAGNOSIS — J9690 Respiratory failure, unspecified, unspecified whether with hypoxia or hypercapnia: Secondary | ICD-10-CM | POA: Insufficient documentation

## 2015-09-05 LAB — GLUCOSE, CAPILLARY
GLUCOSE-CAPILLARY: 102 mg/dL — AB (ref 65–99)
GLUCOSE-CAPILLARY: 103 mg/dL — AB (ref 65–99)
GLUCOSE-CAPILLARY: 79 mg/dL (ref 65–99)
GLUCOSE-CAPILLARY: 92 mg/dL (ref 65–99)
GLUCOSE-CAPILLARY: 92 mg/dL (ref 65–99)
GLUCOSE-CAPILLARY: 98 mg/dL (ref 65–99)

## 2015-09-05 LAB — MAGNESIUM: MAGNESIUM: 1.4 mg/dL — AB (ref 1.7–2.4)

## 2015-09-05 LAB — POCT I-STAT 3, ART BLOOD GAS (G3+)
Acid-Base Excess: 5 mmol/L — ABNORMAL HIGH (ref 0.0–2.0)
Bicarbonate: 28.6 mEq/L — ABNORMAL HIGH (ref 20.0–24.0)
O2 SAT: 100 %
PCO2 ART: 37.1 mmHg (ref 35.0–45.0)
PH ART: 7.497 — AB (ref 7.350–7.450)
PO2 ART: 217 mmHg — AB (ref 80.0–100.0)
Patient temperature: 99.2
TCO2: 30 mmol/L (ref 0–100)

## 2015-09-05 LAB — BASIC METABOLIC PANEL
ANION GAP: 8 (ref 5–15)
BUN: 5 mg/dL — AB (ref 6–20)
CO2: 26 mmol/L (ref 22–32)
Calcium: 7.9 mg/dL — ABNORMAL LOW (ref 8.9–10.3)
Chloride: 105 mmol/L (ref 101–111)
Creatinine, Ser: 0.69 mg/dL (ref 0.61–1.24)
GFR calc Af Amer: >60 mL/min (ref >60)
GFR calc non Af Amer: >60 mL/min (ref >60)
GLUCOSE: 90 mg/dL (ref 65–99)
POTASSIUM: 2.8 mmol/L — AB (ref 3.5–5.1)
Sodium: 139 mmol/L (ref 135–145)

## 2015-09-05 LAB — CULTURE, RESPIRATORY W GRAM STAIN

## 2015-09-05 LAB — TRIGLYCERIDES: TRIGLYCERIDES: 75 mg/dL (ref <150)

## 2015-09-05 LAB — CBC
HCT: 32.6 % — ABNORMAL LOW (ref 39.0–52.0)
Hemoglobin: 10.9 g/dL — ABNORMAL LOW (ref 13.0–17.0)
MCH: 29.2 pg (ref 26.0–34.0)
MCHC: 33.4 g/dL (ref 30.0–36.0)
MCV: 87.4 fL (ref 78.0–100.0)
PLATELETS: 253 10*3/uL (ref 150–400)
RBC: 3.73 MIL/uL — AB (ref 4.22–5.81)
RDW: 12.9 % (ref 11.5–15.5)
WBC: 24.6 10*3/uL — ABNORMAL HIGH (ref 4.0–10.5)

## 2015-09-05 LAB — CULTURE, RESPIRATORY: CULTURE: NORMAL

## 2015-09-05 LAB — PHOSPHORUS: PHOSPHORUS: 2.9 mg/dL (ref 2.5–4.6)

## 2015-09-05 MED ORDER — CETYLPYRIDINIUM CHLORIDE 0.05 % MT LIQD
7.0000 mL | Freq: Four times a day (QID) | OROMUCOSAL | Status: DC
  Administered 2015-09-05 – 2015-09-07 (×8): 7 mL via OROMUCOSAL

## 2015-09-05 MED ORDER — MAGNESIUM SULFATE 4 GM/100ML IV SOLN
4.0000 g | Freq: Once | INTRAVENOUS | Status: AC
  Administered 2015-09-05: 4 g via INTRAVENOUS
  Filled 2015-09-05: qty 100

## 2015-09-05 MED ORDER — VITAL HIGH PROTEIN PO LIQD
1000.0000 mL | ORAL | Status: DC
  Administered 2015-09-05 – 2015-09-07 (×3): 1000 mL

## 2015-09-05 MED ORDER — ACETAMINOPHEN 160 MG/5ML PO SOLN
650.0000 mg | Freq: Four times a day (QID) | ORAL | Status: DC | PRN
  Administered 2015-09-06 (×2): 650 mg
  Filled 2015-09-05 (×2): qty 20.3

## 2015-09-05 MED ORDER — POTASSIUM CHLORIDE 10 MEQ/50ML IV SOLN
10.0000 meq | INTRAVENOUS | Status: AC
  Administered 2015-09-05 (×6): 10 meq via INTRAVENOUS
  Filled 2015-09-05 (×6): qty 50

## 2015-09-05 MED ORDER — CHLORHEXIDINE GLUCONATE 0.12 % MT SOLN
15.0000 mL | Freq: Two times a day (BID) | OROMUCOSAL | Status: DC
  Administered 2015-09-05 – 2015-09-06 (×4): 15 mL via OROMUCOSAL

## 2015-09-05 MED FILL — Medication: Qty: 1 | Status: AC

## 2015-09-05 NOTE — Progress Notes (Signed)
PULMONARY / CRITICAL CARE MEDICINE   Name: [name] MRN: [id number] DOB: 10/28/1980    ADMISSION DATE:  09/02/2015 CONSULTATION DATE:  09/03/2015  REFERRING MD:  Dr. Julien NordmannLangeland  CHIEF COMPLAINT:  Respiratory distress.  SUBJECTIVE:  Reintubated last night due to sudden desaturations Near arrest for L lung mucous plugging and emergent bronch was performed Central venous cath also placed  VITAL SIGNS: BP 108/64 mmHg  Pulse 59  Temp(Src) 99.2 F (37.3 C) (Oral)  Resp 12  Ht 5\' 11"  (1.803 m)  Wt 150 lb 12.7 oz (68.4 kg)  BMI 21.04 kg/m2  SpO2 100%  INTAKE / OUTPUT: I/O last 3 completed shifts: In: 5829.2 [I.V.:4524.9; NG/GT:343.3; IV Piggyback:961] Out: 1451 [Urine:1191; Emesis/NG output:260]  PHYSICAL EXAMINATION: General: ill appearing, thin Neuro: somnolent, non verbal, follows simple commands HEENT: no stridor Cardiovascular:  Regular rate and rhythm, no murmurs Lungs:  Clear to auscultation bilaterally Abdomen:  Soft, + bowel sounds, non tender Musculoskeletal:  No edema, decreased muscle bulk Skin:  No rashes  LABS:  BMET  Recent Labs Lab 09/03/15 0034 09/04/15 0605 09/05/15 0404  NA 140 141 139  K 3.6 2.6* 2.8*  CL 102 107 105  CO2 26 26 26   BUN 11 12 5*  CREATININE 0.63 0.64 0.69  GLUCOSE 85 97 90    Electrolytes  Recent Labs Lab 09/03/15 0034 09/03/15 1816 09/04/15 0605 09/05/15 0404  CALCIUM 9.0  --  8.4* 7.9*  MG  --  1.7 1.7 1.4*  PHOS  --  3.8 1.9* 2.9    CBC  Recent Labs Lab 09/03/15 0034 09/04/15 0605 09/05/15 0404  WBC 10.1 19.1* 24.6*  HGB 13.6 12.1* 10.9*  HCT 38.9* 35.8* 32.6*  PLT 293 251 253    Coag's No results for input(s): APTT, INR in the last 168 hours.  Sepsis Markers  Recent Labs Lab 08/29/15 1635 08/29/15 1921 09/03/15 0049  LATICACIDVEN 2.0* 2.1* 0.89    ABG  Recent Labs Lab 09/04/15 1206 09/04/15 2034 09/05/15 0357  PHART 7.449 7.402 7.497*  PCO2ART 38.8 39.7 37.1  PO2ART 89.0  16.0* 217.0*    Liver Enzymes  Recent Labs Lab 08/30/15 0513 09/03/15 0034 09/04/15 0605  AST 20 35 25  ALT 16* 16* 16*  ALKPHOS 84 90 70  BILITOT 0.2* 2.0* 1.1  ALBUMIN 3.3* 4.0 2.7*    Cardiac Enzymes No results for input(s): TROPONINI, PROBNP in the last 168 hours.  Glucose  Recent Labs Lab 09/04/15 0836 09/04/15 1237 09/04/15 1646 09/04/15 2003 09/05/15 0005 09/05/15 0329  GLUCAP 111* 150* 116* 89 92 103*    Imaging Dg Chest Port 1 View  09/05/2015  CLINICAL DATA:  Respiratory failure EXAM: PORTABLE CHEST 1 VIEW COMPARISON:  09/04/2015 FINDINGS: The support apparatus is in good position and stable. The endotracheal tube is 3 cm above the carina. External pacer paddles are still in place. The cardiac silhouette, mediastinal and hilar contours are normal and unchanged. Persistent left basilar atelectasis and small left effusion. IMPRESSION: Support apparatus in good position, unchanged. Persistent left basilar atelectasis and small left effusion. Electronically Signed   By: Rudie MeyerP.  Gallerani M.D.   On: 09/05/2015 07:12   Dg Chest Port 1 View  09/04/2015  CLINICAL DATA:  Initial evaluation for line placement. EXAM: PORTABLE CHEST 1 VIEW COMPARISON:  Prior radiograph from 09/03/2009. FINDINGS: Patient is intubated with the tip of the endotracheal to positioned 5 cm above the carina. Interval placement of a left IJ approach central venous catheter with tip  overlying the cavoatrial junction. Enteric tube courses in of the abdomen. Side hole overlies a stomach. Tip nonvisualized. Defibrillator pad overlies the chest. Heart size stable. Improved aeration of the left lung with residual left perihilar and basilar opacities, at least in part atelectasis, although superimposed aspiration or infection may be present. Right lung clear. No pneumothorax. Osseous structures unchanged. IMPRESSION: 1. Left IJ approach central venous catheter tip overlying the cavoatrial junction. 2. Endotracheal  tube 5 cm above the carina. Enteric tube overlying the stomach. 3. Improved aeration of the left lung with residual left basilar opacities. Findings at least in part reflect atelectasis, although superimposed infection or aspiration may be present. Electronically Signed   By: Rise Mu M.D.   On: 09/04/2015 22:57   Dg Chest Port 1 View  09/04/2015  CLINICAL DATA:  Intubation. EXAM: PORTABLE CHEST 1 VIEW COMPARISON:  09/04/2015 FINDINGS: Interval intubation with endotracheal tube just below the clavicular heads. There is new extensive opacification and volume loss on the left, with mediastinal shift and diaphragm elevation. Hyperinflated right lung which is comparatively clear. No pneumothorax is seen. Grossly normal heart size. Pending follow-up chest x-ray. IMPRESSION: 1. New endotracheal tube in good position. 2. Extensive left-sided atelectasis with lower lobe collapse. Question interval aspiration. Electronically Signed   By: Marnee Spring M.D.   On: 09/04/2015 21:35     STUDIES:  7/15 CT head >> no acute pathology  CULTURES: 7/15 Sputum >>  ANTIBIOTICS: 7/15 Unasyn >>   SIGNIFICANT EVENTS: 7/14 Admit 7/15 To ICU, VDRF  LINES/TUBES: 7/15 ETT >> 7/16 (self extubated) 7/16 ETT>>  DISCUSSION: 35 yo male with hx of benzo, opiate, THC abuse was trying to do detox readmitted with lethargy.  Developed acute hypoxic respiratory failure with concern for aspiration and transferred to ICU.  ASSESSMENT / PLAN:  PULMONARY A: Acute hypoxic respiratory failure P:   Continue ventilation, wean as able Daily CXR  CARDIOVASCULAR A:  SIRS/Sepsis criteria- resolving P:  Continue IV fluids  RENAL A:   Hypokalemia- repleting  Hypophosphatemia- repleting P:   Monitor renal fx, urine outpt, electrolytes  GASTROINTESTINAL A:   Protein calorie malnutrition P:   NPO  Protonix for SUP Consider Cortrak placement for nutrition if cannot be extubated in the next couple  days  HEMATOLOGIC A:   Leukocytosis (WBC 24.6) P:  Daily CBCs Lovenox for DVT prophylaxis  INFECTIOUS A:   Aspiration pneumonia  P:   Continue Unasyn (day 3)  ENDOCRINE A:   No acute issues  P:   Monitor blood sugar on BMET   NEUROLOGIC A:   Acute encephalopathy with concern for drug withdrawal vs overdose P:   Monitor mental status Stop home Etizolam 150-200 mg/day (bought online from [place])  PSYCHIATRIC A:  Has been following with a psychiatrist for the last 5 years. No official psychiatric diagnosis besides substance abuse. Has been apart of the 12 step program and on Suboxone 23 mg daily. Dr. Robby Sermon does not believe he was ever suicidal over the years or depressed.  P: Continue to monitor inpatient. Dr. Barth Kirks # is [phone] (patient's psychiatrist) for any questions  Family: Updated pts mother and girlfriend at bedside on 7/17.  Anders Simmonds, MD [facility], PGY-2

## 2015-09-05 NOTE — Progress Notes (Signed)
eLink Physician-Brief Progress Note Patient Name: Jordan PilonBrian A Chapman DOB: 01/20/1981 MRN: [id number]   Date of Service  09/05/2015  HPI/Events of Note  Request to renew bilateral wrist restraints.   eICU Interventions  Will renew bilateral soft wrist restraints.      Intervention Category Minor Interventions: Agitation / anxiety - evaluation and management  [name],[name] 09/05/2015, 8:37 PM

## 2015-09-05 NOTE — Progress Notes (Signed)
Initial Nutrition Assessment  DOCUMENTATION CODES:   Not applicable  INTERVENTION:    Initiate TF via OGT with Vital High Protein at goal rate of 65 ml/h (1320 ml per day) to provide 1320 kcals, 116 gm protein, 1104 ml free water daily.  Total calorie intake with TF + propofol will be 1827 kcal daily.  NUTRITION DIAGNOSIS:   Inadequate oral intake related to inability to eat as evidenced by NPO status.  GOAL:   Patient will meet greater than or equal to 90% of their needs  MONITOR:   Vent status, Labs, Weight trends, TF tolerance, I & O's  REASON FOR ASSESSMENT:   Ventilator, Consult Enteral/tube feeding initiation and management  ASSESSMENT:   35 yo male with hx of benzo, opiate, THC abuse was trying to do detox readmitted with lethargy. Developed acute hypoxic respiratory failure with concern for aspiration and transferred to ICU.   Discussed patient in ICU rounds and with RN today. Per verbal order from CCM physician, RD will order TF. Nutrition-Focused physical exam completed. Findings are no fat depletion, mild-moderate muscle depletion, and no edema.  Labs reviewed: potassium and magnesium are low Medications reviewed and include: potassium chloride Patient is currently intubated on ventilator support MV: 6.6 L/min Temp (24hrs), Avg:99 F (37.2 C), Min:98.3 F (36.8 C), Max:99.6 F (37.6 C)  Propofol: 19.2 ml/hr providing 507 kcal per day.   Diet Order:  Diet NPO time specified  Skin:  Reviewed, no issues  Last BM:  unknown  Height:   Ht Readings from Last 1 Encounters:  09/03/15 5\' 11"  (1.803 m)    Weight:   Wt Readings from Last 1 Encounters:  09/05/15 150 lb 12.7 oz (68.4 kg)    Ideal Body Weight:  78.2 kg  BMI:  Body mass index is 21.04 kg/(m^2).  Estimated Nutritional Needs:   Kcal:  1859  Protein:  100-115 gm  Fluid:  2 L  EDUCATION NEEDS:   No education needs identified at this time  Joaquin CourtsKimberly Harris, RD, LDN, CNSC Pager  [phone] After Hours Pager [phone]

## 2015-09-05 NOTE — Progress Notes (Signed)
CSW following for substance abuse resources and psychiatric recommendations once Patient is medically appropriate. Patient reintubated on last night due to sudden desaturations. Pt. Is 35 YO with medical history significant of polysubstance abuse (Benzos, Opiates, THC abuse) who was apparently was attempting to detox.     Lance MussAshley Gardner,MSW, LCSW [facility] ED/13M Clinical Social Worker [phone]

## 2015-09-06 ENCOUNTER — Inpatient Hospital Stay (HOSPITAL_COMMUNITY): Payer: Medicaid Other

## 2015-09-06 LAB — CBC
HEMATOCRIT: 34.8 % — AB (ref 39.0–52.0)
Hemoglobin: 11.6 g/dL — ABNORMAL LOW (ref 13.0–17.0)
MCH: 29.4 pg (ref 26.0–34.0)
MCHC: 33.3 g/dL (ref 30.0–36.0)
MCV: 88.1 fL (ref 78.0–100.0)
PLATELETS: 216 10*3/uL (ref 150–400)
RBC: 3.95 MIL/uL — ABNORMAL LOW (ref 4.22–5.81)
RDW: 13 % (ref 11.5–15.5)
WBC: 12.2 10*3/uL — ABNORMAL HIGH (ref 4.0–10.5)

## 2015-09-06 LAB — GLUCOSE, CAPILLARY
GLUCOSE-CAPILLARY: 150 mg/dL — AB (ref 65–99)
GLUCOSE-CAPILLARY: 149 mg/dL — AB (ref 65–99)
Glucose-Capillary: 127 mg/dL — ABNORMAL HIGH (ref 65–99)
Glucose-Capillary: 133 mg/dL — ABNORMAL HIGH (ref 65–99)
Glucose-Capillary: 154 mg/dL — ABNORMAL HIGH (ref 65–99)
Glucose-Capillary: 84 mg/dL (ref 65–99)

## 2015-09-06 LAB — BASIC METABOLIC PANEL
ANION GAP: 7 (ref 5–15)
BUN: 8 mg/dL (ref 6–20)
CALCIUM: 7.8 mg/dL — AB (ref 8.9–10.3)
CO2: 29 mmol/L (ref 22–32)
Chloride: 102 mmol/L (ref 101–111)
Creatinine, Ser: 0.59 mg/dL — ABNORMAL LOW (ref 0.61–1.24)
Glucose, Bld: 140 mg/dL — ABNORMAL HIGH (ref 65–99)
POTASSIUM: 2.8 mmol/L — AB (ref 3.5–5.1)
SODIUM: 138 mmol/L (ref 135–145)

## 2015-09-06 MED ORDER — DEXMEDETOMIDINE HCL IN NACL 200 MCG/50ML IV SOLN
0.4000 ug/kg/h | INTRAVENOUS | Status: DC
  Administered 2015-09-06: 0.4 ug/kg/h via INTRAVENOUS
  Administered 2015-09-06 (×2): 0.6 ug/kg/h via INTRAVENOUS
  Administered 2015-09-07: 0.3 ug/kg/h via INTRAVENOUS
  Filled 2015-09-06 (×7): qty 50

## 2015-09-06 MED ORDER — POTASSIUM CHLORIDE 20 MEQ/15ML (10%) PO SOLN
40.0000 meq | Freq: Four times a day (QID) | ORAL | Status: AC
  Administered 2015-09-06 (×2): 40 meq
  Filled 2015-09-06 (×4): qty 30

## 2015-09-06 MED ORDER — IPRATROPIUM-ALBUTEROL 0.5-2.5 (3) MG/3ML IN SOLN
3.0000 mL | Freq: Four times a day (QID) | RESPIRATORY_TRACT | Status: DC
  Administered 2015-09-06 – 2015-09-08 (×8): 3 mL via RESPIRATORY_TRACT
  Filled 2015-09-06 (×8): qty 3

## 2015-09-06 MED ORDER — POTASSIUM CHLORIDE 20 MEQ/15ML (10%) PO SOLN
40.0000 meq | Freq: Two times a day (BID) | ORAL | Status: DC
  Administered 2015-09-06: 40 meq
  Filled 2015-09-06: qty 30

## 2015-09-06 NOTE — Progress Notes (Signed)
PULMONARY / CRITICAL CARE MEDICINE   Name: Jordan PilonBrian A Bowlby MRN: [id number] DOB: 08/07/1980    ADMISSION DATE:  09/02/2015 CONSULTATION DATE:  09/03/2015  REFERRING MD:  Dr. Julien NordmannLangeland  CHIEF COMPLAINT:  Respiratory distress.  SUBJECTIVE:  Febrile overnight (T max 101.6 around midnight) Remains intubated, no weaning Required soft restraints last night  VITAL SIGNS: BP 124/80 mmHg  Pulse 115  Temp(Src) 99.7 F (37.6 C) (Oral)  Resp 13  Ht 5\' 11"  (1.803 m)  Wt 152 lb 1.9 oz (69 kg)  BMI 21.23 kg/m2  SpO2 99%  INTAKE / OUTPUT: I/O last 3 completed shifts: In: 5207.1 [I.V.:3806.1; NG/GT:140; IV Piggyback:1261] Out: 2150 [Urine:2150]  PHYSICAL EXAMINATION: General: ill appearing, thin Neuro: somnolent, non verbal, is not following commands at this time HEENT: no stridor, faint bilateral rhonchi  Cardiovascular:  Regular rate and rhythm, no murmurs Lungs:  Clear to auscultation bilaterally Abdomen:  Soft, + bowel sounds, non tender Musculoskeletal:  No edema, decreased muscle bulk Skin:  No rashes  LABS:  BMET  Recent Labs Lab 09/04/15 0605 09/05/15 0404 09/06/15 0429  NA 141 139 138  K 2.6* 2.8* 2.8*  CL 107 105 102  CO2 26 26 29   BUN 12 5* 8  CREATININE 0.64 0.69 0.59*  GLUCOSE 97 90 140*    Electrolytes  Recent Labs Lab 09/03/15 1816 09/04/15 0605 09/05/15 0404 09/06/15 0429  CALCIUM  --  8.4* 7.9* 7.8*  MG 1.7 1.7 1.4*  --   PHOS 3.8 1.9* 2.9  --     CBC  Recent Labs Lab 09/03/15 0034 09/04/15 0605 09/05/15 0404  WBC 10.1 19.1* 24.6*  HGB 13.6 12.1* 10.9*  HCT 38.9* 35.8* 32.6*  PLT 293 251 253    Coag's No results for input(s): APTT, INR in the last 168 hours.  Sepsis Markers  Recent Labs Lab 09/03/15 0049  LATICACIDVEN 0.89    ABG  Recent Labs Lab 09/04/15 1206 09/04/15 2034 09/05/15 0357  PHART 7.449 7.402 7.497*  PCO2ART 38.8 39.7 37.1  PO2ART 89.0 16.0* 217.0*    Liver Enzymes  Recent Labs Lab  09/03/15 0034 09/04/15 0605  AST 35 25  ALT 16* 16*  ALKPHOS 90 70  BILITOT 2.0* 1.1  ALBUMIN 4.0 2.7*    Cardiac Enzymes No results for input(s): TROPONINI, PROBNP in the last 168 hours.  Glucose  Recent Labs Lab 09/05/15 0806 09/05/15 1056 09/05/15 1539 09/05/15 2036 09/06/15 0017 09/06/15 0353  GLUCAP 102* 98 92 79 84 150*    Imaging No results found.   STUDIES:  7/15 CT head >> no acute pathology  CULTURES: 7/15 Sputum >>  ANTIBIOTICS: 7/15 Unasyn >>   SIGNIFICANT EVENTS: 7/14 Admit 7/15 To ICU, VDRF 7/16 emergent re-intubation; near arrest from left lung mucus plugging, emergent bronch for atelectasis.  LINES/TUBES: 7/15 ETT >> 7/16 (self extubated) 7/16 ETT>>  DISCUSSION: 35 yo male with hx of benzo, opiate, THC abuse was trying to do detox readmitted with lethargy.  Developed acute hypoxic respiratory failure with concern for aspiration and transferred to ICU.  ASSESSMENT / PLAN:  PULMONARY A: Acute hypoxic respiratory failure P:   Continue ventilation, wean as able Daily CXR Add scheduled Duonebs q6  CARDIOVASCULAR A:  SIRS/Sepsis criteria- resolving P:  Continue IV fluids  RENAL A:   Hypokalemia (2.8)- repleting  Hypophosphatemia- repleted P:   Monitor renal fx, urine outpt, electrolytes Replete K   GASTROINTESTINAL A:   Protein calorie malnutrition P:   NPO  Protonix for SUP  NG tube in place  HEMATOLOGIC A:   Leukocytosis  P:  Daily CBCs Lovenox for DVT prophylaxis  INFECTIOUS A:   Aspiration pneumonia  P:   Continue Unasyn (day 4) Blood and resp cx pending  ENDOCRINE A:   No acute issues  P:   Monitor blood sugar on BMET   NEUROLOGIC A:   Acute encephalopathy with concern for drug withdrawal vs overdose P:   Monitor mental status Stop home Etizolam 150-200 mg/day (bought online from [place])  Decrease Fentanyl Add Precedex   PSYCHIATRIC A:  Has been following with a psychiatrist for the last 5  years. No official psychiatric diagnosis besides substance abuse. Has been apart of the 12 step program and on Suboxone 23 mg daily. Dr. Robby Sermon does not believe he was ever suicidal over the years or depressed.  P: Continue to monitor inpatient. Dr. Barth Kirks # is [phone] (patient's psychiatrist) for any questions  Family: Updated pts mother and girlfriend at bedside on 7/17.  Anders Simmonds, MD [facility], PGY-2

## 2015-09-06 NOTE — Progress Notes (Signed)
eLink Physician-Brief Progress Note Patient Name: Jordan PilonBrian A Chapman DOB: 05/17/1980 MRN: [id number]   Date of Service  09/06/2015  HPI/Events of Note  No am labs ordered  eICU Interventions  BMET     Intervention Category Minor Interventions: Routine modifications to care plan (e.g. PRN medications for pain, fever)  [name] 09/06/2015, 3:45 AM

## 2015-09-06 NOTE — Progress Notes (Signed)
eLink Physician-Brief Progress Note Patient Name: Jordan PilonBrian A Chapman DOB: 08/28/1980 MRN: [id number]   Date of Service  09/06/2015  HPI/Events of Note    eICU Interventions  Hypokalemia, repleted      Intervention Category Minor Interventions: Electrolytes abnormality - evaluation and management  Max FickleDouglas McQuaid 09/06/2015, 5:45 AM

## 2015-09-06 NOTE — Progress Notes (Signed)
eLink Physician-Brief Progress Note Patient Name: [name] DOB: 04/20/1980 MRN: [id number]   Date of Service  09/06/2015  HPI/Events of Note  Fever, empiric treatment for pneumonia; cvl in place  eICU Interventions  Blood and resp cultures     Intervention Category Intermediate Interventions: Infection - evaluation and management  Max FickleDouglas McQuaid 09/06/2015, 2:29 AM

## 2015-09-07 ENCOUNTER — Inpatient Hospital Stay (HOSPITAL_COMMUNITY): Payer: Medicaid Other

## 2015-09-07 DIAGNOSIS — J9600 Acute respiratory failure, unspecified whether with hypoxia or hypercapnia: Secondary | ICD-10-CM | POA: Insufficient documentation

## 2015-09-07 LAB — GLUCOSE, CAPILLARY
GLUCOSE-CAPILLARY: 122 mg/dL — AB (ref 65–99)
Glucose-Capillary: 101 mg/dL — ABNORMAL HIGH (ref 65–99)
Glucose-Capillary: 101 mg/dL — ABNORMAL HIGH (ref 65–99)
Glucose-Capillary: 112 mg/dL — ABNORMAL HIGH (ref 65–99)
Glucose-Capillary: 127 mg/dL — ABNORMAL HIGH (ref 65–99)
Glucose-Capillary: 133 mg/dL — ABNORMAL HIGH (ref 65–99)

## 2015-09-07 LAB — BASIC METABOLIC PANEL
Anion gap: 7 (ref 5–15)
BUN: 10 mg/dL (ref 6–20)
CALCIUM: 8.1 mg/dL — AB (ref 8.9–10.3)
CO2: 28 mmol/L (ref 22–32)
CREATININE: 0.43 mg/dL — AB (ref 0.61–1.24)
Chloride: 104 mmol/L (ref 101–111)
GFR calc Af Amer: >60 mL/min (ref >60)
GFR calc non Af Amer: >60 mL/min (ref >60)
GLUCOSE: 110 mg/dL — AB (ref 65–99)
Potassium: 3.9 mmol/L (ref 3.5–5.1)
Sodium: 139 mmol/L (ref 135–145)

## 2015-09-07 LAB — CBC
HEMATOCRIT: 30.8 % — AB (ref 39.0–52.0)
Hemoglobin: 10.1 g/dL — ABNORMAL LOW (ref 13.0–17.0)
MCH: 29.1 pg (ref 26.0–34.0)
MCHC: 32.8 g/dL (ref 30.0–36.0)
MCV: 88.8 fL (ref 78.0–100.0)
PLATELETS: 210 10*3/uL (ref 150–400)
RBC: 3.47 MIL/uL — ABNORMAL LOW (ref 4.22–5.81)
RDW: 13.1 % (ref 11.5–15.5)
WBC: 9.1 10*3/uL (ref 4.0–10.5)

## 2015-09-07 LAB — MAGNESIUM: Magnesium: 1.8 mg/dL (ref 1.7–2.4)

## 2015-09-07 LAB — PHOSPHORUS: Phosphorus: 2.6 mg/dL (ref 2.5–4.6)

## 2015-09-07 MED ORDER — CETYLPYRIDINIUM CHLORIDE 0.05 % MT LIQD
7.0000 mL | Freq: Two times a day (BID) | OROMUCOSAL | Status: DC
  Administered 2015-09-08 (×2): 7 mL via OROMUCOSAL

## 2015-09-07 MED ORDER — ANTISEPTIC ORAL RINSE SOLUTION (CORINZ)
7.0000 mL | Freq: Four times a day (QID) | OROMUCOSAL | Status: DC
  Administered 2015-09-07 (×3): 7 mL via OROMUCOSAL

## 2015-09-07 MED ORDER — CHLORHEXIDINE GLUCONATE 0.12% ORAL RINSE (MEDLINE KIT)
15.0000 mL | Freq: Two times a day (BID) | OROMUCOSAL | Status: DC
  Administered 2015-09-07: 15 mL via OROMUCOSAL

## 2015-09-07 MED ORDER — CHLORHEXIDINE GLUCONATE 0.12 % MT SOLN
15.0000 mL | Freq: Two times a day (BID) | OROMUCOSAL | Status: DC
  Administered 2015-09-07 – 2015-09-08 (×2): 15 mL via OROMUCOSAL

## 2015-09-07 NOTE — Progress Notes (Signed)
Pt placed back on full vent support due to decreased pt effort. 

## 2015-09-07 NOTE — Progress Notes (Signed)
PULMONARY / CRITICAL CARE MEDICINE   Name: [name] MRN: [id number] DOB: 05-17-1980    ADMISSION DATE:  09/02/2015 CONSULTATION DATE:  09/03/2015  REFERRING MD:  Dr. Julien Nordmann  CHIEF COMPLAINT:  Respiratory distress.  SUBJECTIVE:  Afebrile overnight (Last fever was at 1500 yesterday) Had some episodes of hypotension, lowest MAP was 61 at midnight but is now improved this morning Remains intubated, no weaning Precedex weaned  Fentanyl weaned  VITAL SIGNS: BP 104/68 mmHg  Pulse 69  Temp(Src) 99.1 F (37.3 C) (Oral)  Resp 18  Ht  (1.803 m)  Wt 153 lb (69.4 kg)  BMI 21.35 kg/m2  SpO2 96%  INTAKE / OUTPUT: I/O last 3 completed shifts: In: 4975.1 [I.V.:2210.1; Other:10; NG/GT:1755; IV Piggyback:1000] Out: 2960 [Urine:2960]  PHYSICAL EXAMINATION: General: ill appearing, thin Neuro: sleepy but alert, non verbal, follow simple commands HEENT: no stridor, faint bilateral rhonchi  Cardiovascular:  Regular rate and rhythm, no murmurs Lungs:  Clear to auscultation bilaterally Abdomen:  Soft, + bowel sounds, non tender Musculoskeletal:  No edema, decreased muscle bulk Skin:  No rashes  LABS:  BMET  Recent Labs Lab 09/05/15 0404 09/06/15 0429 09/07/15 0422  NA 139 138 139  K 2.8* 2.8* 3.9  CL 105 102 104  CO2 BUN 5* 8 10  CREATININE 0.69 0.59* 0.43*  GLUCOSE 90 140* 110*    Electrolytes  Recent Labs Lab 09/04/15 0605 09/05/15 0404 09/06/15 0429 09/07/15 0422  CALCIUM 8.4* 7.9* 7.8* 8.1*  MG 1.7 1.4*  --  1.8  PHOS 1.9* 2.9  --  2.6    CBC  Recent Labs Lab 09/05/15 0404 09/06/15 0653 09/07/15 0422  WBC 24.6* 12.2* 9.1  HGB 10.9* 11.6* 10.1*  HCT 32.6* 34.8* 30.8*  PLT 253 216 210    Coag's No results for input(s): APTT, INR in the last 168 hours.  Sepsis Markers  Recent Labs Lab 09/03/15 0049  LATICACIDVEN 0.89    ABG  Recent Labs Lab 09/04/15 1206 09/04/15 2034 09/05/15 0357  PHART 7.449 7.402 7.497*   PCO2ART 38.8 39.7 37.1  PO2ART 89.0 16.0* 217.0*    Liver Enzymes  Recent Labs Lab 09/03/15 0034 09/04/15 0605  AST 35 25  ALT 16* 16*  ALKPHOS 90 70  BILITOT 2.0* 1.1  ALBUMIN 4.0 2.7*    Cardiac Enzymes No results for input(s): TROPONINI, PROBNP in the last 168 hours.  Glucose  Recent Labs Lab 09/06/15 0813 09/06/15 1144 09/06/15 1519 09/06/15 2001 09/06/15 2336 09/07/15 0348  GLUCAP 133* 149* 154* 127* 127* 112*    Imaging Dg Chest Port 1 View  09/06/2015  CLINICAL DATA:  Pneumonia, respiratory failure intubated patient, polysubstance abuse EXAM: PORTABLE CHEST 1 VIEW COMPARISON:  Portable chest x-ray of September 05, 2015 FINDINGS: The lungs are well-expanded. There is persistent left basilar density. The heart is normal in size. The pulmonary vascularity is not engorged. The endotracheal tube tip lies 3.5 cm above the carina. The esophagogastric tube tip projects below the inferior margin of the image. The left internal jugular venous catheter tip projects over the midportion of the SVC. IMPRESSION: Persistent left lower lobe atelectasis or pneumonia. Trace left pleural effusion. The support tubes are in reasonable position. Electronically Signed   By: David  [place] M.D.   On: 09/06/2015 07:28     STUDIES:  7/15 CT head >> no acute pathology  CULTURES: 7/18 Sputum >>   ANTIBIOTICS: 7/15 Unasyn >>   SIGNIFICANT EVENTS: 7/14 Admit 7/15  To ICU, VDRF 7/16 emergent re-intubation; near arrest from left lung mucus plugging, emergent bronch for atelectasis.  LINES/TUBES: 7/15 ETT >> 7/16 (self extubated) 7/16 ETT>>  DISCUSSION: 35 yo male with hx of benzo, opiate, THC abuse was trying to do detox readmitted with lethargy.  Developed acute hypoxic respiratory failure with concern for aspiration and transferred to ICU.  ASSESSMENT / PLAN:  PULMONARY A: Acute hypoxic respiratory failure P:   Extubate today Continue scheduled Duonebs q 6 for  now  CARDIOVASCULAR A:  SIRS/Sepsis criteria- resolved Intermittent hypotension P:  Vital signs per floor protocol  RENAL A:   Hypokalemia- resolved Hypophosphatemia- resolved P:   Monitor renal fx, urine outpt, electrolytes  GASTROINTESTINAL A:   Protein calorie malnutrition P:   Stop Protonix for SUP Remove NG tube, NPO for 4 hours, then sips and chips and ADAT  HEMATOLOGIC A:   Leukocytosis- Resolved P:  Daily CBCs Lovenox for DVT prophylaxis  INFECTIOUS A:   Aspiration pneumonia  P:   Continue Unasyn (day 5) Blood and resp cx pending  ENDOCRINE A:   No acute issues  P:   Monitor blood sugar on BMET   NEUROLOGIC A:   Acute encephalopathy with concern for drug withdrawal vs overdose P:   Monitor mental status Stop home Etizolam 150-200 mg/day (bought online from [place])  Stop Fentanyl Stop Precedex   PSYCHIATRIC A:  Has been following with a psychiatrist for the last 5 years. No official psychiatric diagnosis besides substance abuse. Has been apart of the 12 step program and on Suboxone 23 mg daily. Dr. Robby SermonHendricks does not believe he was ever suicidal over the years or depressed.  P: Continue to monitor inpatient. Dr. Barth KirksHendrick's # is [phone] (patient's psychiatrist) for any questions  Family: Updated pts mother and girlfriend at bedside on 7/17.  Anders Simmondshristina Manas Hickling, MD [facility], PGY-2

## 2015-09-07 NOTE — Progress Notes (Signed)
Rt instructed the patient on the proper us of flutter valve. The patient was able to demonstrate back good technique.

## 2015-09-07 NOTE — Progress Notes (Addendum)
CSW continues to follow for substance abuse resources and psychiatric recommendations/disposition. Patient remains intubated w/ weaning attempts.

## 2015-09-07 NOTE — Procedures (Signed)
Extubation Procedure Note  Patient Details:   Name: [name] DOB: 03/11/1980 MRN: [id number]   Airway Documentation:     Evaluation  O2 sats: stable throughout Complications: No apparent complications Patient did tolerate procedure well. Bilateral Breath Sounds: Rhonchi, Diminished   Yes   Positive cuff leak noted. Patient placed on nasal cannula 4 Lpm with humidity, no stridor noted, ELink notified.  Forest BeckerJean S Koichi Platte 09/07/2015, 11:35 AM

## 2015-09-08 ENCOUNTER — Inpatient Hospital Stay (HOSPITAL_COMMUNITY): Payer: Medicaid Other

## 2015-09-08 LAB — BENZODIAZEPINES,MS,WB/SP RFX
7-AMINOCLONAZEPAM: NEGATIVE ng/mL
ALPRAZOLAM: NEGATIVE ng/mL
BENZODIAZEPINES CONFIRM: POSITIVE
Chlordiazepoxide: NEGATIVE ng/mL
Clonazepam: 9 ng/mL
Desalkylflurazepam: NEGATIVE ng/mL
Desmethylchlordiazepoxide: NEGATIVE ng/mL
Desmethyldiazepam: NEGATIVE ng/mL
Diazepam: NEGATIVE ng/mL
FLURAZEPAM: NEGATIVE ng/mL
Lorazepam: NEGATIVE ng/mL
Midazolam: NEGATIVE ng/mL
OXAZEPAM: NEGATIVE ng/mL
TEMAZEPAM: NEGATIVE ng/mL
TRIAZOLAM: NEGATIVE ng/mL

## 2015-09-08 LAB — BASIC METABOLIC PANEL
ANION GAP: 9 (ref 5–15)
ANION GAP: 12 (ref 5–15)
BUN: 7 mg/dL (ref 6–20)
BUN: 7 mg/dL (ref 6–20)
CHLORIDE: 102 mmol/L (ref 101–111)
CHLORIDE: 103 mmol/L (ref 101–111)
CO2: 28 mmol/L (ref 22–32)
CO2: 25 mmol/L (ref 22–32)
Calcium: 9.1 mg/dL (ref 8.9–10.3)
Calcium: 8.9 mg/dL (ref 8.9–10.3)
Creatinine, Ser: 0.54 mg/dL — ABNORMAL LOW (ref 0.61–1.24)
Creatinine, Ser: 0.75 mg/dL (ref 0.61–1.24)
GFR calc Af Amer: >60 mL/min (ref >60)
GFR calc Af Amer: >60 mL/min (ref >60)
GFR calc non Af Amer: >60 mL/min (ref >60)
GLUCOSE: 96 mg/dL (ref 65–99)
GLUCOSE: 118 mg/dL — AB (ref 65–99)
POTASSIUM: 3.3 mmol/L — AB (ref 3.5–5.1)
POTASSIUM: 3.4 mmol/L — AB (ref 3.5–5.1)
Sodium: 139 mmol/L (ref 135–145)
Sodium: 140 mmol/L (ref 135–145)

## 2015-09-08 LAB — GLUCOSE, CAPILLARY
GLUCOSE-CAPILLARY: 103 mg/dL — AB (ref 65–99)
GLUCOSE-CAPILLARY: 105 mg/dL — AB (ref 65–99)
GLUCOSE-CAPILLARY: 109 mg/dL — AB (ref 65–99)
GLUCOSE-CAPILLARY: 128 mg/dL — AB (ref 65–99)
Glucose-Capillary: 94 mg/dL (ref 65–99)
Glucose-Capillary: 116 mg/dL — ABNORMAL HIGH (ref 65–99)

## 2015-09-08 LAB — CBC
HCT: 34 % — ABNORMAL LOW (ref 39.0–52.0)
HEMOGLOBIN: 11.3 g/dL — AB (ref 13.0–17.0)
MCH: 29.5 pg (ref 26.0–34.0)
MCHC: 33.2 g/dL (ref 30.0–36.0)
MCV: 88.8 fL (ref 78.0–100.0)
Platelets: 293 10*3/uL (ref 150–400)
RBC: 3.83 MIL/uL — ABNORMAL LOW (ref 4.22–5.81)
RDW: 13.2 % (ref 11.5–15.5)
WBC: 9.8 10*3/uL (ref 4.0–10.5)

## 2015-09-08 LAB — MAGNESIUM: Magnesium: 2 mg/dL (ref 1.7–2.4)

## 2015-09-08 MED ORDER — CLONAZEPAM 0.5 MG PO TABS
0.5000 mg | ORAL_TABLET | Freq: Two times a day (BID) | ORAL | Status: DC
  Administered 2015-09-08 – 2015-09-12 (×10): 0.5 mg via ORAL
  Filled 2015-09-08 (×11): qty 1

## 2015-09-08 MED ORDER — BUPRENORPHINE HCL-NALOXONE HCL 8-2 MG SL FILM
1.0000 | ORAL_FILM | Freq: Every day | SUBLINGUAL | Status: DC
  Administered 2015-09-08 – 2015-09-12 (×5): 1 via SUBLINGUAL

## 2015-09-08 MED ORDER — MAGNESIUM SULFATE 2 GM/50ML IV SOLN
2.0000 g | Freq: Once | INTRAVENOUS | Status: AC
  Administered 2015-09-08: 2 g via INTRAVENOUS
  Filled 2015-09-08: qty 50

## 2015-09-08 MED ORDER — IPRATROPIUM-ALBUTEROL 0.5-2.5 (3) MG/3ML IN SOLN: 3.0000 mL | RESPIRATORY_TRACT | Status: DC | PRN

## 2015-09-08 MED ORDER — IPRATROPIUM-ALBUTEROL 0.5-2.5 (3) MG/3ML IN SOLN: 3.0000 mL | Freq: Four times a day (QID) | RESPIRATORY_TRACT | Status: DC | PRN

## 2015-09-08 MED ORDER — LORAZEPAM 2 MG/ML IJ SOLN: 1.0000 mg | INTRAMUSCULAR | Status: DC | PRN

## 2015-09-08 MED ORDER — POTASSIUM CHLORIDE 10 MEQ/50ML IV SOLN
10.0000 meq | INTRAVENOUS | Status: AC
  Administered 2015-09-08 (×4): 10 meq via INTRAVENOUS
  Filled 2015-09-08 (×4): qty 50

## 2015-09-08 MED ORDER — BUPRENORPHINE HCL-NALOXONE HCL 8-2 MG SL FILM: 1.0000 | ORAL_FILM | Freq: Every day | SUBLINGUAL | Status: DC

## 2015-09-08 NOTE — Progress Notes (Signed)
Rehab Admissions Coordinator Note:  Patient was screened by Clois DupesBoyette, Bryar Dahms Godwin for appropriateness for an Inpatient Acute Rehab Consult per PT recommendation.   At this time, we are recommending await further progress with therapy to assist in determing rehab needs. Also recommend OT eval. I will follow. Clois Dupes.  Mckenzye Cutright Godwin 09/08/2015, 1:46 PM  I can be reached at [phone].

## 2015-09-08 NOTE — Progress Notes (Addendum)
eLink Physician-Brief Progress Note Patient Name: Jordan PilonBrian A Chapman DOB: 08/30/1980 MRN: [id number]   Date of Service  09/08/2015  HPI/Events of Note  Pt with 1 min of seizure activity, clenching teeth, non verbal, flexing UE  eICU Interventions  2mg  IV ativan given, now post ictal. Replace K and Mg Check labs CT head  Done 7/15 - no abnormlities      Intervention Category Major Interventions: Change in mental status - evaluation and management  [name] 09/08/2015, 6:54 AM

## 2015-09-08 NOTE — Progress Notes (Signed)
Entered pts room to find pt with seizure activity- clenching teeth, flexing bilateral upper extremities and upward gaze.  Elink notified and 2mg  Ativan given.  Pt then post ictal with confusion.

## 2015-09-08 NOTE — Progress Notes (Signed)
EEG Completed; Results Pending  

## 2015-09-08 NOTE — Evaluation (Signed)
Physical Therapy Evaluation Patient Details Name: Jordan PilonBrian A Chapman MRN: [id number] DOB: 08/22/1980 Today's Date: 09/08/2015   History of Present Illness  35 yo with VDRF 7/15 from aspiration setting of drug withdrawal, altered mental status. Self extubated 7/16>> emergent re-intubation; near arrest from left lung mucus plugging, emergent bronch for atelectasis.  Clinical Impression  Pt admitted with above diagnosis. Pt currently with functional limitations due to the deficits listed below (see PT Problem List). Pt was able to transfer stand pivot to chair with mod assist. Would benefit frm therapy and will need equipment with eventual d/c home with family assist.  Pt will benefit from skilled PT to increase their independence and safety with mobility to allow discharge to the venue listed below.      Follow Up Recommendations CIR;Supervision/Assistance - 24 hour    Equipment Recommendations  Rolling walker with 5" wheels;3in1 (PT);Wheelchair (measurements PT);Wheelchair cushion (measurements PT);Hospital bed    Recommendations for Other Services Rehab consult     Precautions / Restrictions Precautions Precautions: Fall Restrictions Weight Bearing Restrictions: No      Mobility  Bed Mobility Overal bed mobility: Needs Assistance Bed Mobility: Supine to Sit     Supine to sit: Min assist     General bed mobility comments: Needed incr time to get to EOB.  Used pad to assist to get all the way to EOB.   Transfers Overall transfer level: Needs assistance Equipment used: 2 person hand held assist Transfers: Sit to/from UGI CorporationStand;Stand Pivot Transfers Sit to Stand: Mod assist;From elevated surface Stand pivot transfers: Mod assist       General transfer comment: Pt was able to power up with mod assist and steadying assist as he has slight posterior lean.  Pt was able to take pivotal steps to chair with bil UE support on PT.  Slightly flexed posture with need to control descent  into chair.   Ambulation/Gait                Stairs            Wheelchair Mobility    Modified Rankin (Stroke Patients Only)       Balance Overall balance assessment: Needs assistance Sitting-balance support: Bilateral upper extremity supported;Feet supported Sitting balance-Leahy Scale: Poor Sitting balance - Comments: Needed bil UE support to balance.  Postural control: Posterior lean Standing balance support: Bilateral upper extremity supported;During functional activity Standing balance-Leahy Scale: Poor Standing balance comment: Pt needed bil UE support in standing as he is slightly off balance due to posterior lean.  Bil knee instability noted.  Poor endurance as well.                              Pertinent Vitals/Pain Pain Assessment: No/denies pain  VSS.  On 2LO2.      Home Living Family/patient expects to be discharged to:: Private residence Living Arrangements: Parent (mom and girlfriend) Available Help at Discharge: Family;Available 24 hours/day Type of Home: House Home Access: Stairs to enter Entrance Stairs-Rails: None Entrance Stairs-Number of Steps: 5 Home Layout: One level Home Equipment: None      Prior Function Level of Independence: Needs assistance   Gait / Transfers Assistance Needed: Pt was needing mod assist for sit to stand and mod assist for gait according to girlfriend.  Pt very unsteady per girlfriend.   ADL's / Homemaking Assistance Needed: A by mom and girlfriend.        Hand Dominance  Extremity/Trunk Assessment   Upper Extremity Assessment: Defer to OT evaluation           Lower Extremity Assessment: Generalized weakness         Communication   Communication: No difficulties  Cognition Arousal/Alertness: Awake/alert Behavior During Therapy: Flat affect Overall Cognitive Status: Within Functional Limits for tasks assessed                      General Comments       Exercises General Exercises - Lower Extremity Ankle Circles/Pumps: AROM;Both;10 reps;Seated Long Arc Quad: AROM;Both;5 reps;Seated Hip Flexion/Marching: AROM;Both;5 reps;Seated      Assessment/Plan    PT Assessment Patient needs continued PT services  PT Diagnosis Generalized weakness   PT Problem List Decreased activity tolerance;Decreased balance;Decreased mobility;Decreased strength;Decreased knowledge of use of DME;Decreased safety awareness;Decreased knowledge of precautions;Decreased coordination  PT Treatment Interventions DME instruction;Gait training;Stair training;Functional mobility training;Therapeutic activities;Therapeutic exercise;Balance training;Patient/family education   PT Goals (Current goals can be found in the Care Plan section) Acute Rehab PT Goals Patient Stated Goal: to go home with mom PT Goal Formulation: With patient Time For Goal Achievement: 09/22/15 Potential to Achieve Goals: Good    Frequency Min 3X/week   Barriers to discharge        Co-evaluation               End of Session Equipment Utilized During Treatment: Gait belt;Oxygen Activity Tolerance: Patient limited by fatigue Patient left: in chair;with call bell/phone within reach;with family/visitor present Nurse Communication: Mobility status         Time: 1009-1036 PT Time Calculation (min) (ACUTE ONLY): 27 min   Charges:   PT Evaluation $PT Eval Moderate Complexity: 1 Procedure PT Treatments $Therapeutic Activity: 8-22 mins   PT G CodesBerline Lopes 10-03-15, 1:08 PM Blannie Shedlock,PT Acute Rehabilitation [phone] (pager)

## 2015-09-08 NOTE — Progress Notes (Signed)
PULMONARY / CRITICAL CARE MEDICINE   Name: [name] MRN: [id number] DOB: 12/10/1980    ADMISSION DATE:  09/02/2015 CONSULTATION DATE:  09/03/2015  REFERRING MD:  Dr. Julien NordmannLangeland  CHIEF COMPLAINT:  Respiratory distress.  SUBJECTIVE:  Afebrile for > 24 hours Off pressors, BP stable On 3 L [place] Had seizure this morning (see Dr. Crista CurbNestor's note)  VITAL SIGNS: BP 120/74 mmHg  Pulse 104  Temp(Src) 99.3 F (37.4 C) (Oral)  Resp 21  Ht 5\' 11"  (1.803 m)  Wt 143 lb 8.3 oz (65.1 kg)  BMI 20.03 kg/m2  SpO2 100%  INTAKE / OUTPUT: I/O last 3 completed shifts: In: 1826.7 [I.V.:91.7; Other:350; NG/GT:935; IV Piggyback:450] Out: 3475 [Urine:3475]  PHYSICAL EXAMINATION: General: ill appearing, thin, post ictal this morning  Neuro: sleepy but alert, verbal, follow simple commands HEENT: no stridor, faint bilateral rhonchi  Cardiovascular:  Regular rate and rhythm, no murmurs Lungs:  Clear to auscultation bilaterally Abdomen:  Soft, + bowel sounds, non tender Musculoskeletal:  No edema, decreased muscle bulk Skin:  No rashes  LABS:  BMET  Recent Labs Lab 09/06/15 0429 09/07/15 0422 09/08/15 0456  NA 138 139 139  K 2.8* 3.9 3.3*  CL 102 104 102  CO2 29 28 28   BUN 8 10 7   CREATININE 0.59* 0.43* 0.54*  GLUCOSE 140* 110* 96    Electrolytes  Recent Labs Lab 09/04/15 0605 09/05/15 0404 09/06/15 0429 09/07/15 0422 09/08/15 0456  CALCIUM 8.4* 7.9* 7.8* 8.1* 9.1  MG 1.7 1.4*  --  1.8  --   PHOS 1.9* 2.9  --  2.6  --     CBC  Recent Labs Lab 09/06/15 0653 09/07/15 0422 09/08/15 0456  WBC 12.2* 9.1 9.8  HGB 11.6* 10.1* 11.3*  HCT 34.8* 30.8* 34.0*  PLT 216 210 293    Coag's No results for input(s): APTT, INR in the last 168 hours.  Sepsis Markers  Recent Labs Lab 09/03/15 0049  LATICACIDVEN 0.89    ABG  Recent Labs Lab 09/04/15 1206 09/04/15 2034 09/05/15 0357  PHART 7.449 7.402 7.497*  PCO2ART 38.8 39.7 37.1  PO2ART 89.0 16.0* 217.0*     Liver Enzymes  Recent Labs Lab 09/03/15 0034 09/04/15 0605  AST 35 25  ALT 16* 16*  ALKPHOS 90 70  BILITOT 2.0* 1.1  ALBUMIN 4.0 2.7*    Cardiac Enzymes No results for input(s): TROPONINI, PROBNP in the last 168 hours.  Glucose  Recent Labs Lab 09/07/15 0817 09/07/15 1116 09/07/15 1546 09/07/15 1934 09/08/15 0001 09/08/15 0330  GLUCAP 122* 133* 101* 101* 109* 94    Imaging Dg Chest Port 1 View  09/07/2015  CLINICAL DATA:  Respiratory failure. EXAM: PORTABLE CHEST 1 VIEW COMPARISON:  09/06/2015. FINDINGS: Endotracheal tube, left IJ line, NG tube in stable position. Heart size normal. Improvement of left lower lobe atelectasis. Persistent left lower lobe infiltrate consistent with pneumonia. No pleural effusion or pneumothorax. IMPRESSION: 1. Lines and tubes in stable position. 2. Interim improvement of left base atelectasis. Persistent left lower lobe infiltrate consistent pneumonia. Electronically Signed   By: Maisie Fushomas  Register   On: 09/07/2015 07:41     STUDIES:  7/15 CT head >> no acute pathology  CULTURES: 7/18 Sputum >>   ANTIBIOTICS: 7/15 Unasyn >>   SIGNIFICANT EVENTS: 7/14 Admit 7/15 To ICU, VDRF 7/16 emergent re-intubation; near arrest from left lung mucus plugging, emergent bronch for atelectasis.  LINES/TUBES: 7/15 ETT >> 7/16 (self extubated) 7/16 ETT>>  DISCUSSION: 35 yo male with  hx of benzo, opiate, THC abuse was trying to do detox readmitted with lethargy.  Developed acute hypoxic respiratory failure with concern for aspiration and transferred to ICU.  ASSESSMENT / PLAN:  PULMONARY A: Acute hypoxic respiratory failure P:   Continue to monitor on room air Change Duonebs to q 6 PRN instead of scheduled  CARDIOVASCULAR A:  SIRS/Sepsis criteria- resolved Intermittent hypotension- improved Tachycardia P:  Vital signs per floor protocol  RENAL A:   Hypokalemia- repleting Hypophosphatemia- resolved P:   Monitor renal fx,  urine outpt, electrolytes Replete K  Check Mag  GASTROINTESTINAL A:   Protein calorie malnutrition P:   Advance diet to regular  HEMATOLOGIC A:   Leukocytosis- Resolved P:  Daily CBCs Lovenox for DVT prophylaxis  INFECTIOUS A:   Aspiration pneumonia  P:   Continue Unasyn (Day 6) Blood and resp cx NGTD  ENDOCRINE A:   No acute issues  P:   Monitor blood sugar on BMET   NEUROLOGIC A:   Acute encephalopathy with concern for drug withdrawal vs overdose- improving Seizure on 7/20- CT head neg on 7/15 Hx of opioid abuse P:   Monitor mental status Stop home Etizolam 150-200 mg/day (bought online from [place])  IV Ativan 2 mg PRN for seizures Seizure precautions Resume Suboxone at 8 mg daily Resume home Clonazepam 0.5 mg BID  PSYCHIATRIC A:  Has been following with a psychiatrist for the last 5 years. No official psychiatric diagnosis besides substance abuse. Has been apart of the 12 step program and on Suboxone 23 mg daily. Dr. Robby Sermon does not believe he was ever suicidal over the years or depressed.  P: Continue to monitor inpatient. Dr. Barth Kirks # is [phone] (patient's psychiatrist) for any questions Home Clonazepam as above  Family: Updated pts mother and girlfriend at bedside on 7/20.  Anders Simmonds, MD [facility], PGY-2

## 2015-09-08 NOTE — Progress Notes (Signed)
PCCM Attending Note: Patient seen at bedside for 1 minute of focal left upper arm flexion, nonverbal status, and clenching of the teeth with probable seizure activity. Administered 2 mg of IV Ativan. Patient now awake and conversant. Although, he is somewhat postictal. Previous CT head negative. No prior seizures per nursing report at bedside. Checking EEG. Ordering seizure precautions.  Donna ChristenJennings E. Jamison NeighborNestor, M.D. [facility] Pager:  [phone] After 3pm or if no response, call [phone] 7:06 AM 09/08/2015

## 2015-09-08 NOTE — Procedures (Signed)
HPI:  35 y/o with MS chnage  TECHNICAL SUMMARY:  A multichannel referential and bipolar montage EEG using the standard international 10-20 system was performed on the patient described as awake.  Most of the background recording consists of an excessive amount of fast (beta) activity.  During brief portions of the recording 9 Hz occipital dominant rhythm is noted.   ACTIVATION:  Stepwise photic stimulation at 4-20 flashes per second was performed and did not elicit any abnormal waveforms but did produce a symmetric driving response.  Hyperventilation was not performed.  EPILEPTIFORM ACTIVITY:  There were no spikes, sharp waves or paroxysmal activity.  SLEEP:  No sleep  CARDIAC:  The EKG lead revealed a regular sinus rhythm.  IMPRESSION:  This EEG demonstrated no focal, hemispheric, or lateralizing features.  No epileptiform activity was recorded.  There was an excessive amount of fast (beta) activity noted throughout the recording, which is often due to medication effect.  This is usually due to medication such as benzodiazepines.  Correlate clinically.

## 2015-09-09 ENCOUNTER — Inpatient Hospital Stay (HOSPITAL_COMMUNITY): Payer: Medicaid Other

## 2015-09-09 LAB — THC,MS,WB/SP RFX
CANNABINOID CONFIRMATION: POSITIVE
Cannabidiol: NEGATIVE ng/mL
Cannabinol: NEGATIVE ng/mL
Carboxy-THC: 10.3 ng/mL
HYDROXY-THC: NEGATIVE ng/mL
Tetrahydrocannabinol(THC): NEGATIVE ng/mL

## 2015-09-09 LAB — BASIC METABOLIC PANEL
ANION GAP: 10 (ref 5–15)
Anion gap: 9 (ref 5–15)
BUN: 6 mg/dL (ref 6–20)
BUN: <5 mg/dL — ABNORMAL LOW (ref 6–20)
CALCIUM: 9.4 mg/dL (ref 8.9–10.3)
CHLORIDE: 102 mmol/L (ref 101–111)
CHLORIDE: 104 mmol/L (ref 101–111)
CO2: 27 mmol/L (ref 22–32)
CO2: 26 mmol/L (ref 22–32)
CREATININE: 0.59 mg/dL — AB (ref 0.61–1.24)
Calcium: 9 mg/dL (ref 8.9–10.3)
Creatinine, Ser: 0.55 mg/dL — ABNORMAL LOW (ref 0.61–1.24)
GFR calc Af Amer: >60 mL/min (ref >60)
GFR calc non Af Amer: >60 mL/min (ref >60)
GFR calc non Af Amer: >60 mL/min (ref >60)
Glucose, Bld: 109 mg/dL — ABNORMAL HIGH (ref 65–99)
Glucose, Bld: 102 mg/dL — ABNORMAL HIGH (ref 65–99)
POTASSIUM: 2.9 mmol/L — AB (ref 3.5–5.1)
Potassium: 3.7 mmol/L (ref 3.5–5.1)
SODIUM: 138 mmol/L (ref 135–145)
SODIUM: 140 mmol/L (ref 135–145)

## 2015-09-09 LAB — BLOOD GAS, ARTERIAL
ACID-BASE EXCESS: 4.1 mmol/L — AB (ref 0.0–2.0)
Bicarbonate: 27.7 mEq/L — ABNORMAL HIGH (ref 20.0–24.0)
DRAWN BY: 441371
FIO2: 0.21
O2 SAT: 96.3 %
PCO2 ART: 39.1 mmHg (ref 35.0–45.0)
Patient temperature: 99.1
TCO2: 28.8 mmol/L (ref 0–100)
pH, Arterial: 7.465 — ABNORMAL HIGH (ref 7.350–7.450)
pO2, Arterial: 84.5 mmHg (ref 80.0–100.0)

## 2015-09-09 LAB — DRUG SCREEN 10 W/CONF, SERUM
Amphetamines, IA: NEGATIVE ng/mL
BARBITURATES, IA: NEGATIVE ug/mL
BENZODIAZEPINES, IA: POSITIVE ng/mL
COCAINE & METABOLITE, IA: NEGATIVE ng/mL
Methadone, IA: NEGATIVE ng/mL
Opiates, IA: NEGATIVE ng/mL
Oxycodones, IA: NEGATIVE ng/mL
PHENCYCLIDINE, IA: NEGATIVE ng/mL
PROPOXYPHENE, IA: NEGATIVE ng/mL
THC(Marijuana) Metabolite, IA: POSITIVE ng/mL

## 2015-09-09 LAB — CBC
HCT: 37.9 % — ABNORMAL LOW (ref 39.0–52.0)
HEMOGLOBIN: 12.4 g/dL — AB (ref 13.0–17.0)
MCH: 29.7 pg (ref 26.0–34.0)
MCHC: 32.7 g/dL (ref 30.0–36.0)
MCV: 90.9 fL (ref 78.0–100.0)
PLATELETS: 378 10*3/uL (ref 150–400)
RBC: 4.17 MIL/uL — AB (ref 4.22–5.81)
RDW: 13.2 % (ref 11.5–15.5)
WBC: 9.9 10*3/uL (ref 4.0–10.5)

## 2015-09-09 LAB — TSH: TSH: 1.765 u[IU]/mL (ref 0.350–4.500)

## 2015-09-09 LAB — AMMONIA: AMMONIA: 36 umol/L — AB (ref 9–35)

## 2015-09-09 MED ORDER — POTASSIUM CHLORIDE CRYS ER 20 MEQ PO TBCR
40.0000 meq | EXTENDED_RELEASE_TABLET | Freq: Two times a day (BID) | ORAL | Status: DC
  Administered 2015-09-09: 40 meq via ORAL
  Filled 2015-09-09 (×2): qty 2

## 2015-09-09 MED ORDER — ENSURE ENLIVE PO LIQD
237.0000 mL | Freq: Two times a day (BID) | ORAL | Status: DC
  Administered 2015-09-10 – 2015-09-13 (×7): 237 mL via ORAL
  Filled 2015-09-09 (×9): qty 237

## 2015-09-09 NOTE — Progress Notes (Signed)
[place] TEAM 1 - Stepdown/ICU TEAM  [name]  ZOX:[id number] DOB: 1980-03-14 DOA: 09/02/2015 PCP: Kaleen Mask, MD    Brief Narrative:  35 yo male with hx of benzo, opiate, THC abuse was trying to do detox readmitted with lethargy. Developed acute hypoxic respiratory failure with concern for aspiration and transferred to ICU.  Significant Events: 7/14 Admit 7/15 To ICU, VDRF 7/16 self extubated - emergent re-intubation; near arrest from left lung mucus plugging, emergent bronch for atelectasis 7/20 EEG - no acute findings   Subjective: The pt will not respond to questions this morning, nor to tactile stimuli.  He does not appear uncomfortable or in any acute distress.  He is not able to provide a ROS.  I spoke w/ his mother at bedside.    Assessment & Plan:  Acute hypoxic respiratory failure - Sepsis due to LLL Aspiration pneumonia  Stabilizing clinically - sats normal range on RA - sepsis physiology has resolved   Acute encephalopathy with concern for drug withdrawal vs overdose Mental status continues to wax and wane w/o clear cause - ?psychogenic - complete a metabolic evaluation   Seizure on 7/20 CT head neg 7/15  Intermittent hypotension BP currently stable   Hypokalemia Cont to replace - Mg normal   Hypophosphatemia Normalized - recheck in AM   Protein calorie malnutrition  Hx of polysubstance abuse - opioids, benzos, tobacco  Stopped home Etizolam 150-200 mg/day (bought online from [place]) - resumed Suboxone at 8 mg daily + home Clonazepam 0.5 mg BID  PSYCHIATRIC Has been following with a Psychiatrist for the last 5 years - no official psychiatric diagnosis besides substance abuse - has been a part of the 12 step program and on Suboxone 23 mg daily - Dr. Robby Sermon does not believe he was ever suicidal over the years or depressed (Dr. Barth Kirks # is [phone])  DVT prophylaxis: lovenox  Code Status: FULL CODE Family Communication: spoke w/  mother at bedside   Disposition Plan: transfer to SDU   Consultants:  PCCM Neurology   Antimicrobials:  Unasyn 7/15 > 7/21  Objective: Blood pressure 127/89, pulse 92, temperature 99.1 F (37.3 C), temperature source Oral, resp. rate 19, height 5\' 11"  (1.803 m), weight 63.9 kg (140 lb 14 oz), SpO2 97 %.  Intake/Output Summary (Last 24 hours) at 09/09/15 0918 Last data filed at 09/09/15 5621  Gross per 24 hour  Intake    500 ml  Output   1725 ml  Net  -1225 ml   Filed Weights   09/07/15 0425 09/08/15 0455 09/09/15 0416  Weight: 69.4 kg (153 lb) 65.1 kg (143 lb 8.3 oz) 63.9 kg (140 lb 14 oz)    Examination: General: No acute respiratory distress Lungs: poor air movement L base - no wheeze - CTA th/o other fields  Cardiovascular: Regular rate and rhythm without murmur gallop or rub normal S1 and S2 Abdomen: Nontender, nondistended, very thin, soft, bowel sounds positive, no rebound, no ascites, no appreciable mass Extremities: No significant cyanosis, clubbing, or edema bilateral lower extremities  CBC:  Recent Labs Lab 09/03/15 0034 09/04/15 0605 09/05/15 0404 09/06/15 0653 09/07/15 0422 09/08/15 0456  WBC 10.1 19.1* 24.6* 12.2* 9.1 9.8  NEUTROABS 7.9*  --   --   --   --   --   HGB 13.6 12.1* 10.9* 11.6* 10.1* 11.3*  HCT 38.9* 35.8* 32.6* 34.8* 30.8* 34.0*  MCV 85.9 86.9 87.4 88.1 88.8 88.8  PLT 293 251 253 216 210 293  Basic Metabolic Panel:  Recent Labs Lab 09/03/15 1816 09/04/15 [id number] 09/05/15 0404 09/06/15 0429 09/07/15 0422 09/08/15 0456 09/08/15 0745 09/09/15 0221  NA  --  141 139 138 139 139 140 138  K  --  2.6* 2.8* 2.8* 3.9 3.3* 3.4* 2.9*  CL  --  107 105 102 104 102 103 102  CO2  --  26 26 29 28 28 25 27   GLUCOSE  --  97 90 140* 110* 96 118* 109*  BUN  --  12 5* 8 10 7 7 6   CREATININE  --  0.64 0.69 0.59* 0.43* 0.54* 0.75 0.55*  CALCIUM  --  8.4* 7.9* 7.8* 8.1* 9.1 8.9 9.0  MG 1.7 1.7 1.4*  --  1.8  --  2.0  --   PHOS 3.8 1.9* 2.9  --   2.6  --   --   --    GFR: Estimated Creatinine Clearance: 118.7 mL/min (by C-G formula based on Cr of 0.55).  Liver Function Tests:  Recent Labs Lab 09/03/15 0034 09/04/15 0605  AST 35 25  ALT 16* 16*  ALKPHOS 90 70  BILITOT 2.0* 1.1  PROT 8.0 6.1*  ALBUMIN 4.0 2.7*    Recent Labs Lab 09/03/15 0034  LIPASE 29    Recent Labs Lab 09/03/15 0034  AMMONIA 40*    CBG:  Recent Labs Lab 09/08/15 0330 09/08/15 0821 09/08/15 1135 09/08/15 1551 09/08/15 1920  GLUCAP 94 116* 105* 103* 128*    Recent Results (from the past 240 hour(s))  MRSA PCR Screening     Status: None   Collection Time: 09/03/15 11:12 AM  Result Value Ref Range Status   MRSA by PCR NEGATIVE NEGATIVE Final    Comment:        The GeneXpert MRSA Assay (FDA approved for NASAL specimens only), is one component of a comprehensive MRSA colonization surveillance program. It is not intended to diagnose MRSA infection nor to guide or monitor treatment for MRSA infections.   Culture, respiratory (NON-Expectorated)     Status: None   Collection Time: 09/03/15  8:00 PM  Result Value Ref Range Status   Specimen Description ENDOTRACHEAL  Final   Special Requests NONE  Final   Gram Stain   Final    ABUNDANT WBC PRESENT, PREDOMINANTLY PMN RARE SQUAMOUS EPITHELIAL CELLS PRESENT MODERATE GRAM VARIABLE ROD FEW GRAM POSITIVE COCCI IN PAIRS    Culture Consistent with normal respiratory flora.  Final   Report Status 09/05/2015 FINAL  Final  Culture, blood (Routine X 2) w Reflex to ID Panel     Status: None (Preliminary result)   Collection Time: 09/06/15  2:41 AM  Result Value Ref Range Status   Specimen Description BLOOD RIGHT ANTECUBITAL  Final   Special Requests BOTTLES DRAWN AEROBIC AND ANAEROBIC 5CC  Final   Culture NO GROWTH 2 DAYS  Final   Report Status PENDING  Incomplete  Culture, blood (Routine X 2) w Reflex to ID Panel     Status: None (Preliminary result)   Collection Time: 09/06/15  2:44  AM  Result Value Ref Range Status   Specimen Description BLOOD RIGHT ANTECUBITAL  Final   Special Requests BOTTLES DRAWN AEROBIC AND ANAEROBIC 5CC  Final   Culture NO GROWTH 2 DAYS  Final   Report Status PENDING  Incomplete  Culture, respiratory (NON-Expectorated)     Status: None (Preliminary result)   Collection Time: 09/06/15  3:26 AM  Result Value Ref Range Status   Specimen  Description TRACHEAL ASPIRATE  Final   Special Requests Normal  Final   Gram Stain   Final    MODERATE WBC PRESENT,BOTH PMN AND MONONUCLEAR RARE SQUAMOUS EPITHELIAL CELLS PRESENT NO ORGANISMS SEEN    Culture FEW YEAST  Final   Report Status PENDING  Incomplete     Scheduled Meds: . ampicillin-sulbactam (UNASYN) IV  3 g Intravenous Q6H  . Buprenorphine HCl-Naloxone HCl  1 Film Sublingual QHS  . clonazePAM  0.5 mg Oral BID  . enoxaparin (LOVENOX) injection  40 mg Subcutaneous Q24H  . potassium chloride  40 mEq Oral BID     LOS: 6 days   Time spent: 35 minutes   Lonia Blood, MD [facility] Office  [phone] Pager - Text Page per Loretha Stapler as per below:  On-Call/Text Page:      [email or web address]      password [email or web address]  If 7PM-7AM, please contact night-coverage [email or web address] Password [email or web address] 09/09/2015, 9:18 AM

## 2015-09-09 NOTE — Progress Notes (Signed)
Nutrition Follow-up  DOCUMENTATION CODES:   Not applicable  INTERVENTION:    Ensure Enlive po BID, each supplement provides 350 kcal and 20 grams of protein  NUTRITION DIAGNOSIS:   Inadequate oral intake related to inability to eat as evidenced by NPO status.  Ongoing  GOAL:   Patient will meet greater than or equal to 90% of their needs  Unmet  MONITOR:   Vent status, Labs, Weight trends, TF tolerance, I & O's  REASON FOR ASSESSMENT:   Ventilator, Consult Enteral/tube feeding initiation and management  ASSESSMENT:   35 yo male with hx of benzo, opiate, THC abuse was trying to do detox readmitted with lethargy. Developed acute hypoxic respiratory failure with concern for aspiration and transferred to ICU.  Labs reviewed: ammonia elevated; BUN & creatinine low. Discussed patient in ICU rounds and with RN today. Patient was extubated on 7/19. TF off since extubation. Diet has been advanced to regular, but patient is eating nothing per RN. Will add PO supplements.   Diet Order:  Diet regular Room service appropriate?: Yes; Fluid consistency:: Thin  Skin:  Reviewed, no issues  Last BM:  unknown  Height:   Ht Readings from Last 1 Encounters:  09/03/15 5\' 11"  (1.803 m)    Weight:   Wt Readings from Last 1 Encounters:  09/09/15 140 lb 14 oz (63.9 kg)    Ideal Body Weight:  78.2 kg  BMI:  Body mass index is 19.66 kg/(m^2).  Estimated Nutritional Needs:   Kcal:  2000-2200  Protein:  100-115 gm  Fluid:  2-2.2 L  EDUCATION NEEDS:   No education needs identified at this time  Joaquin CourtsKimberly Harris, RD, LDN, CNSC Pager [phone] After Hours Pager [phone]

## 2015-09-09 NOTE — Clinical Social Work Note (Signed)
Clinical Social Work Assessment  Patient Details  Name: Jordan PilonBrian A Chapman MRN: [id number] Date of Birth: 03/08/1980  Date of referral:  09/09/15               Reason for consult:  Substance Use/ETOH Abuse                Permission sought to share information with:  Family Supports Permission granted to share information::  Yes, Verbal Permission Granted  Name::     Phillips Odorheresa Haacke (mother) [phone]  Agency::     Relationship::     Contact Information:     Housing/Transportation Living arrangements for the past 2 months:  Single Family Home Source of Information:  Patient, Parent Patient Interpreter Needed:  None Criminal Activity/Legal Involvement Pertinent to Current Situation/Hospitalization:  No - Comment as needed Significant Relationships:  Parents, Significant Other Lives with:  Parents Do you feel safe going back to the place where you live?  Yes Need for family participation in patient care:  Yes (Comment)  Care giving concerns:  Patient's continued substance abuse. Patient's mother reports wanting a Suboxone clinic for Patient at discharge.   Social Worker assessment / plan:  Patient is a 35 YO male with a medical history significant of polysubstance abuse. Patient was admitted due to altered mental status on 09/02/15 following a discharge earlier that day. Patient reportedly was initially discharged home with his mother and mother states that once Patient got home, he was intermittently sleepy and then began vomiting. She gave him Suboxone 8 mg at the instruction of his additionologist, to see if it would help. Patient's mother reported that patient was unresponsive and she called 911. Per Patient's mother, prior to initial admission, Patient had been taking "designer drugs", which he obtains from the Internet in [place].   CSW engaged with Patient at his bedside. Patient not communicating with CSW even after several attempts. CSW contacted Patient's mother. CSW introduced  self, role of CSW, and discussed substance abuse concerns. Patient open and receptive to substance abuse resources and continued to express his desire to remain sober upon discharge. CSW provided Patient with brief substance abuse counseling and emotional support. CSW provided Patient with resources for both residential and outpatient substance abuse resources. CSW also provided Patient with list of Methadone, Suboxone, and Buprenorphine treatment facilities in [place]. Patient has been following his additionologist, Dr. Bethann PunchesAnn Hendrix [phone]) for the last five years. Patient has received no official psych diagnosis aside from substance abuse. Patient has been a part of a 12 step program and on Suboxone 23 mg daily. Patient was previously using a private Suboxone clinic about four years ago, but since then has been self medicating with opioids from online shopping. Patient reports that he has been doing drugs "all of my life, since I was 15". Patient reports that he has had several rehabilitation and treatments for detox and substance abuse.   Per Patient, his last psychiatric [facility] was [facility]. Patient does not recall any recent hospitalizations. Patient reports that he has been prescribed psychiatric medications in the past but has not been on them for many years. Patient's mother reports that Patient attempted suicide seven years ago after spending three [facility]. Patient denies auditory or visual hallucinations. Patient denies suicidal or homicidal ideation. Patient identifies his mother and girlfriend as his major support system.    Employment status:  Unemployed Health and safety inspectornsurance information:  [place] Recommendations:  Inpatient  Rehab Consult Information / Referral to community resources:  Residential Substance Abuse Treatment Options, Outpatient Substance Abuse Treatment Options  Patient/Family's Response to care:   Patient's mother appreciative of care and resources received at this time. She denies any concerns.   Patient/Family's Understanding of and Emotional Response to Diagnosis, Current Treatment, and Prognosis:  Patient's mother verbalizes strong understanding of Patient's diagnosis, treatment, and prognosis. Patient's mother does identify concerns with Patient's inability vs. Refusal to talk. Patient's mother reports that she and Patient are both in agreement that Patient needs more intensive and monitored substance abuse treatment.   Emotional Assessment Appearance:  Appears older than stated age Attitude/Demeanor/Rapport:   (Cooperative) Affect (typically observed):  Calm, Stable, Appropriate Orientation:  Oriented to Self, Oriented to Place, Oriented to  Time, Oriented to Situation Alcohol / Substance use:  Illicit Drugs Psych involvement (Current and /or in the community):  Yes (Comment), Outpatient Provider  Discharge Needs  Concerns to be addressed:  Substance Abuse Concerns, Care Coordination, Discharge Planning Concerns Readmission within the last 30 days:  Yes Current discharge risk:  None Barriers to Discharge:  No Barriers Identified   Rockwell Germany, LCSW 09/09/2015, 8:34 AM

## 2015-09-10 LAB — CULTURE, RESPIRATORY W GRAM STAIN: Special Requests: NORMAL

## 2015-09-10 LAB — CBC
HEMATOCRIT: 40.4 % (ref 39.0–52.0)
Hemoglobin: 13.4 g/dL (ref 13.0–17.0)
MCH: 30.2 pg (ref 26.0–34.0)
MCHC: 33.2 g/dL (ref 30.0–36.0)
MCV: 91.2 fL (ref 78.0–100.0)
PLATELETS: 355 10*3/uL (ref 150–400)
RBC: 4.43 MIL/uL (ref 4.22–5.81)
RDW: 13.3 % (ref 11.5–15.5)
WBC: 9.1 10*3/uL (ref 4.0–10.5)

## 2015-09-10 LAB — COMPREHENSIVE METABOLIC PANEL
ALT: 32 U/L (ref 17–63)
AST: 22 U/L (ref 15–41)
Albumin: 3.3 g/dL — ABNORMAL LOW (ref 3.5–5.0)
Alkaline Phosphatase: 68 U/L (ref 38–126)
Anion gap: 12 (ref 5–15)
BUN: 9 mg/dL (ref 6–20)
CHLORIDE: 102 mmol/L (ref 101–111)
CO2: 27 mmol/L (ref 22–32)
Calcium: 9.7 mg/dL (ref 8.9–10.3)
Creatinine, Ser: 0.71 mg/dL (ref 0.61–1.24)
GFR calc Af Amer: >60 mL/min (ref >60)
GFR calc non Af Amer: >60 mL/min (ref >60)
GLUCOSE: 88 mg/dL (ref 65–99)
Potassium: 3.5 mmol/L (ref 3.5–5.1)
Sodium: 141 mmol/L (ref 135–145)
Total Bilirubin: 0.8 mg/dL (ref 0.3–1.2)
Total Protein: 7.3 g/dL (ref 6.5–8.1)

## 2015-09-10 LAB — CULTURE, RESPIRATORY

## 2015-09-10 LAB — RPR: RPR Ser Ql: NONREACTIVE

## 2015-09-10 LAB — HIV ANTIBODY (ROUTINE TESTING W REFLEX): HIV SCREEN 4TH GENERATION: NONREACTIVE

## 2015-09-10 MED ORDER — SODIUM CHLORIDE 0.9 % IV SOLN: 3.0000 g | Freq: Four times a day (QID) | INTRAVENOUS | Status: DC

## 2015-09-10 MED ORDER — METHYLPHENIDATE HCL 5 MG PO TABS
30.0000 mg | ORAL_TABLET | Freq: Every day | ORAL | Status: DC
Start: 2015-09-10 — End: 2015-09-13
  Administered 2015-09-11 – 2015-09-13 (×2): 30 mg via ORAL
  Filled 2015-09-10 (×4): qty 6

## 2015-09-10 MED ORDER — METHYLPHENIDATE HCL 20 MG PO TABS: 60.0000 mg | ORAL_TABLET | Freq: Every day | ORAL | Status: DC

## 2015-09-10 MED ORDER — ALBUTEROL SULFATE (2.5 MG/3ML) 0.083% IN NEBU
2.5000 mg | INHALATION_SOLUTION | RESPIRATORY_TRACT | Status: DC | PRN
Start: 2015-09-10 — End: 2015-09-13

## 2015-09-10 MED ORDER — CLONIDINE HCL 0.1 MG PO TABS
0.1000 mg | ORAL_TABLET | Freq: Two times a day (BID) | ORAL | Status: DC
  Administered 2015-09-10 – 2015-09-13 (×7): 0.1 mg via ORAL
  Filled 2015-09-10 (×7): qty 1

## 2015-09-10 MED ORDER — ACETAMINOPHEN 160 MG/5ML PO SOLN: 650.0000 mg | Freq: Four times a day (QID) | ORAL | Status: DC | PRN

## 2015-09-10 MED ORDER — POTASSIUM CHLORIDE CRYS ER 20 MEQ PO TBCR
40.0000 meq | EXTENDED_RELEASE_TABLET | Freq: Once | ORAL | Status: AC
  Administered 2015-09-10: 40 meq via ORAL

## 2015-09-10 NOTE — Progress Notes (Signed)
[facility] TEAM 1 - Stepdown/ICU TEAM  [name]  NWG:[id number] DOB: April 19, 1980 DOA: 09/02/2015 PCP: Kaleen Mask, MD    Brief Narrative:  35 yo male with hx of benzo, opiate, THC abuse was trying to do detox readmitted with lethargy. Developed acute hypoxic respiratory failure with concern for aspiration and transferred to ICU.  Significant Events: 7/14 Admit 7/15 To ICU, VDRF 7/16 self extubated - emergent re-intubation; near arrest from left lung mucus plugging, emergent bronch for atelectasis 7/20 EEG - no acute findings   Subjective: The pt is much more alert today.  He can tell me where he is but is not fully oriented.  He is tremulous.  He c/o some shortness of breath, and nausea.  He denies SSCP, abdom pain, or HA.    Assessment & Plan:  Acute hypoxic respiratory failure - Sepsis due to LLL Aspiration pneumonia  Stabilizing clinically - sats normal range on RA - sepsis physiology has resolved   Acute encephalopathy with concern for drug withdrawal vs overdose Mental status continues to wax and wane w/o clear cause - ?psychogenic - RPR and HIV negative - ammonia not signif elevated - TSH normal - ABG not signif abnormal - remains altered though some improvement appreciated   Sinus tachycardia  Likely related to narcotic and benzo withdrawal - begin clonidine and follow   Seizure on 7/20 CT head neg 7/15 - no evidence of further seizure activity   Intermittent hypotension BP appears to have stabilized   Hypokalemia Not yet at goal - cont to replace - Mg normal   Hypophosphatemia Normalized at time of last check - f/u in AM   Protein calorie malnutrition  Hx of polysubstance abuse - opioids, benzos, tobacco  Stopped home Etizolam 150-200 mg/day (bought online from [place]) - resumed Suboxone at 8 mg daily + home Clonazepam 0.5 mg BID  PSYCHIATRIC Has been following with a Psychiatrist for the last 5 years - no official psychiatric diagnosis besides  substance abuse - has been a part of a12 step program and on Suboxone 23 mg daily - Dr. Robby Sermon does not believe he was ever suicidal over the years or depressed (Dr. Barth Kirks # is [phone])  DVT prophylaxis: lovenox  Code Status: FULL CODE Family Communication: no family present at time of exam today   Disposition Plan: transfer to neuro tele bed - PT/OT   Consultants:  PCCM Neurology   Antimicrobials:  Unasyn 7/15 > 7/21  Objective: Blood pressure 122/93, pulse 103, temperature 99.2 F (37.3 C), temperature source Oral, resp. rate 28, height  (1.803 m), weight 62.2 kg (137 lb 2 oz), SpO2 92 %.  Intake/Output Summary (Last 24 hours) at 09/10/15 0819 Last data filed at 09/09/15 1756  Gross per 24 hour  Intake    200 ml  Output    200 ml  Net      0 ml   Filed Weights   09/08/15 0455 09/09/15 0416 09/10/15 0250  Weight: 65.1 kg (143 lb 8.3 oz) 63.9 kg (140 lb 14 oz) 62.2 kg (137 lb 2 oz)    Examination: General: No acute respiratory distress - more alert today  Lungs: CTA th/o w/o wheeze or focal crackles  Cardiovascular: Tachycardic but regular - no M appreciated  Abdomen: Nontender, nondistended, very thin, soft, bowel sounds positive, no rebound, no ascites Extremities: No significant cyanosis, clubbing, edema bilateral lower extremities  CBC:  Recent Labs Lab 09/06/15 2841 09/07/15 0422 09/08/15 0456 09/09/15 3244 09/10/15 0102  WBC 12.2* 9.1 9.8 9.9 9.1  HGB 11.6* 10.1* 11.3* 12.4* 13.4  HCT 34.8* 30.8* 34.0* 37.9* 40.4  MCV 88.1 88.8 88.8 90.9 91.2  PLT 216 210 293 378 355   Basic Metabolic Panel:  Recent Labs Lab 09/03/15 1816  09/04/15 0605 09/05/15 0404  09/07/15 0422 09/08/15 0456 09/08/15 0745 09/09/15 0221 09/09/15 0929 09/10/15 0718  NA  --   < > 141 139  < > 139 139 140 138 140 141  K  --   < > 2.6* 2.8*  < > 3.9 3.3* 3.4* 2.9* 3.7 3.5  CL  --   < > 107 105  < > 104 102 103 102 104 102  CO2  --   < > 26 26  < > 28 28 25 27  26 27   GLUCOSE  --   < > 97 90  < > 110* 96 118* 109* 102* 88  BUN  --   < > 12 5*  < > 10 7 7 6  <5* 9  CREATININE  --   < > 0.64 0.69  < > 0.43* 0.54* 0.75 0.55* 0.59* 0.71  CALCIUM  --   < > 8.4* 7.9*  < > 8.1* 9.1 8.9 9.0 9.4 9.7  MG 1.7  --  1.7 1.4*  --  1.8  --  2.0  --   --   --   PHOS 3.8  --  1.9* 2.9  --  2.6  --   --   --   --   --   < > = values in this interval not displayed. GFR: Estimated Creatinine Clearance: 115.5 mL/min (by C-G formula based on Cr of 0.71).  Liver Function Tests:  Recent Labs Lab 09/04/15 0605 09/10/15 0718  AST 25 22  ALT 16* 32  ALKPHOS 70 68  BILITOT 1.1 0.8  PROT 6.1* 7.3  ALBUMIN 2.7* 3.3*    Recent Labs Lab 09/09/15 1035  AMMONIA 36*    CBG:  Recent Labs Lab 09/08/15 0330 09/08/15 0821 09/08/15 1135 09/08/15 1551 09/08/15 1920  GLUCAP 94 116* 105* 103* 128*    Recent Results (from the past 240 hour(s))  MRSA PCR Screening     Status: None   Collection Time: 09/03/15 11:12 AM  Result Value Ref Range Status   MRSA by PCR NEGATIVE NEGATIVE Final    Comment:        The GeneXpert MRSA Assay (FDA approved for NASAL specimens only), is one component of a comprehensive MRSA colonization surveillance program. It is not intended to diagnose MRSA infection nor to guide or monitor treatment for MRSA infections.   Culture, respiratory (NON-Expectorated)     Status: None   Collection Time: 09/03/15  8:00 PM  Result Value Ref Range Status   Specimen Description ENDOTRACHEAL  Final   Special Requests NONE  Final   Gram Stain   Final    ABUNDANT WBC PRESENT, PREDOMINANTLY PMN RARE SQUAMOUS EPITHELIAL CELLS PRESENT MODERATE GRAM VARIABLE ROD FEW GRAM POSITIVE COCCI IN PAIRS    Culture Consistent with normal respiratory flora.  Final   Report Status 09/05/2015 FINAL  Final  Culture, blood (Routine X 2) w Reflex to ID Panel     Status: None (Preliminary result)   Collection Time: 09/06/15  2:41 AM  Result Value Ref Range  Status   Specimen Description BLOOD RIGHT ANTECUBITAL  Final   Special Requests BOTTLES DRAWN AEROBIC AND ANAEROBIC 5CC  Final   Culture NO  GROWTH 3 DAYS  Final   Report Status PENDING  Incomplete  Culture, blood (Routine X 2) w Reflex to ID Panel     Status: None (Preliminary result)   Collection Time: 09/06/15  2:44 AM  Result Value Ref Range Status   Specimen Description BLOOD RIGHT ANTECUBITAL  Final   Special Requests BOTTLES DRAWN AEROBIC AND ANAEROBIC 5CC  Final   Culture NO GROWTH 3 DAYS  Final   Report Status PENDING  Incomplete  Culture, respiratory (NON-Expectorated)     Status: None (Preliminary result)   Collection Time: 09/06/15  3:26 AM  Result Value Ref Range Status   Specimen Description TRACHEAL ASPIRATE  Final   Special Requests Normal  Final   Gram Stain   Final    MODERATE WBC PRESENT,BOTH PMN AND MONONUCLEAR RARE SQUAMOUS EPITHELIAL CELLS PRESENT NO ORGANISMS SEEN    Culture FEW YEAST IDENTIFICATION TO FOLLOW   Final   Report Status PENDING  Incomplete     Scheduled Meds: . Buprenorphine HCl-Naloxone HCl  1 Film Sublingual QHS  . clonazePAM  0.5 mg Oral BID  . enoxaparin (LOVENOX) injection  40 mg Subcutaneous Q24H  . feeding supplement (ENSURE ENLIVE)  237 mL Oral BID BM     LOS: 7 days   Time spent: 25 minutes   Lonia Blood, MD [facility] Office  [phone] Pager - Text Page per Amion as per below:  On-Call/Text Page:      [email or web address]      password [email or web address]  If 7PM-7AM, please contact night-coverage [email or web address] Password [facility] 09/10/2015, 8:19 AM

## 2015-09-10 NOTE — Progress Notes (Signed)
Patient accepted to 5C16. Alert, flat affect, no signs/symptoms of pain/discomfort observed will continue to monitor.

## 2015-09-11 ENCOUNTER — Encounter (HOSPITAL_COMMUNITY): Payer: Self-pay | Admitting: *Deleted

## 2015-09-11 LAB — URINALYSIS, ROUTINE W REFLEX MICROSCOPIC
Glucose, UA: NEGATIVE mg/dL
HGB URINE DIPSTICK: NEGATIVE
KETONES UR: NEGATIVE mg/dL
Leukocytes, UA: NEGATIVE
NITRITE: NEGATIVE
PH: 5.5 (ref 5.0–8.0)
Protein, ur: NEGATIVE mg/dL
Specific Gravity, Urine: 1.03 (ref 1.005–1.030)

## 2015-09-11 LAB — BASIC METABOLIC PANEL
Anion gap: 9 (ref 5–15)
BUN: 14 mg/dL (ref 6–20)
CALCIUM: 9.8 mg/dL (ref 8.9–10.3)
CHLORIDE: 102 mmol/L (ref 101–111)
CO2: 28 mmol/L (ref 22–32)
CREATININE: 0.69 mg/dL (ref 0.61–1.24)
GFR calc non Af Amer: >60 mL/min (ref >60)
GLUCOSE: 111 mg/dL — AB (ref 65–99)
Potassium: 3.5 mmol/L (ref 3.5–5.1)
Sodium: 139 mmol/L (ref 135–145)

## 2015-09-11 LAB — CBC
HCT: 41.1 % (ref 39.0–52.0)
Hemoglobin: 13.4 g/dL (ref 13.0–17.0)
MCH: 29.9 pg (ref 26.0–34.0)
MCHC: 32.6 g/dL (ref 30.0–36.0)
MCV: 91.7 fL (ref 78.0–100.0)
PLATELETS: 445 10*3/uL — AB (ref 150–400)
RBC: 4.48 MIL/uL (ref 4.22–5.81)
RDW: 13.4 % (ref 11.5–15.5)
WBC: 11 10*3/uL — ABNORMAL HIGH (ref 4.0–10.5)

## 2015-09-11 LAB — URINE MICROSCOPIC-ADD ON
Bacteria, UA: NONE SEEN
RBC / HPF: NONE SEEN RBC/hpf (ref 0–5)

## 2015-09-11 LAB — CULTURE, BLOOD (ROUTINE X 2)
Culture: NO GROWTH
Culture: NO GROWTH

## 2015-09-11 LAB — MAGNESIUM: Magnesium: 2.1 mg/dL (ref 1.7–2.4)

## 2015-09-11 LAB — PHOSPHORUS: PHOSPHORUS: 4.9 mg/dL — AB (ref 2.5–4.6)

## 2015-09-11 NOTE — Progress Notes (Signed)
[facility] PROGRESS NOTE  [name] IFO:[id number] DOB: [birth year] DOA: 09/02/2015 PCP: Kaleen Mask, MD  Brief Narrative:  35 yo male with hx of benzo, opiate, THC abuse was trying to do detox readmitted with lethargy. Developed acute hypoxic respiratory failure with concern for aspiration and transferred to ICU. Later extubated. Still lethargic per report. This appears to be seelective behavior on pt's part.  Significant Events: 7/14 Admit 7/15 To ICU, VDRF 7/16 self extubated - emergent re-intubation; near arrest from left lung mucus plugging, emergent bronch for atelectasis 7/20 EEG - no acute findings 7/22 Transfer to tele  Subjective:  Patient complains of being weak. Patient is anxious for discharge. Wants to go to inpatient rehab. Denies not a fever.  Assessment & Plan:  Acute hypoxic respiratory failure - Sepsis due to LLL Aspiration pneumonia  Stabilizing clinically - sats normal range on RA - sepsis physiology has resolved   Acute encephalopathy with concern for drug withdrawal vs overdose Mental status continues to wax and wane w/o clear cause - ?psychogenic - RPR and HIV negative - ammonia not signif elevated - TSH normal - ABG not signifigant abnormal - remains altered though some improvement appreciated   Sinus tachycardia  Likely related to narcotic and benzo withdrawal - begin clonidine and follow  Greatly improved over last few days  Seizure on 7/20 CT head neg 7/15 - no evidence of further seizure activity  EEG results reviewed  Intermittent hypotension BP appears to have stabilized   Hypokalemia Replaced prev, will monitor  Hypophosphatemia Normalized at time of last check, intermittent recheck  Protein calorie malnutrition  Hx of polysubstance abuse - opioids, benzos, tobacco  Stopped home Etizolam 150-200 mg/day (bought online from [place]) - resumed Suboxone at 8 mg daily + home Clonazepam 0.5 mg  BID  PSYCHIATRIC Has been following with a Psychiatrist for the last 5 years - no official psychiatric diagnosis besides substance abuse - has been a part of a12 step program and on Suboxone 23 mg daily - Dr. Robby Sermon does not believe he was ever suicidal over the years or depressed (Dr. Barth Kirks # is [phone])  DVT prophylaxis: lovenox  Code Status: FULL CODE Family Communication: no family present at time of exam today   Disposition Plan: transfer to neuro tele bed - PT/OT   Consultants:  PCCM Neurology   Antimicrobials:  Unasyn 7/15 > 7/21  Procedure: EEG 7/20 IMPRESSION:  This EEG demonstrated no focal, hemispheric, or lateralizing features.  No epileptiform activity was recorded.  There was an excessive amount of fast (beta) activity noted throughout the recording, which is often due to medication effect.  This is usually due to medication such as benzodiazepines.  Correlate clinically.  HPI/Subjective:  States he feels weak but can do more. Per family he is stronger. No acute issues per nursing.  Objective: Vitals:   09/11/15 1035 09/11/15 1406  BP: 116/76 107/67  Pulse: (!) 101 (!) 116  Resp: 20 (!) 24  Temp: 98.9 F (37.2 C) 98.3 F (36.8 C)    Intake/Output Summary (Last 24 hours) at 09/11/15 1845 Last data filed at 09/10/15 2215  Gross per 24 hour  Intake              120 ml  Output              500 ml  Net             -380 ml   Filed Weights   09/09/15 0416 09/10/15  0250 09/11/15 0500  Weight: 63.9 kg (140 lb 14 oz) 62.2 kg (137 lb 2 oz) 67.9 kg (149 lb 11.1 oz)    Exam:  General:  No diaphoresis, anxious, no acute distress Cardiovascular: Regular rate and rhythm no murmurs rubs or gallops Respiratory: Clear to auscultation bilaterally no more breathing Abdomen: Nondistended bowel sounds normal nontender palpation Musculoskeletal: Moving all extremities, no deformity, 3 out of 5 strength (effort?)  Data Reviewed: Basic Metabolic  Panel:  Recent Labs Lab 09/05/15 0404  09/07/15 0422  09/08/15 0745 09/09/15 0221 09/09/15 0929 09/10/15 0718 09/11/15 0435  NA 139  < > 139  < > 140 138 140 141 139  K 2.8*  < > 3.9  < > 3.4* 2.9* 3.7 3.5 3.5  CL 105  < > 104  < > 103 102 104 102 102  CO2 26  < > 28  < > 25 27 26 27 28   GLUCOSE 90  < > 110*  < > 118* 109* 102* 88 111*  BUN 5*  < > 10  < > 7 6 <5* 9 14  CREATININE 0.69  < > 0.43*  < > 0.75 0.55* 0.59* 0.71 0.69  CALCIUM 7.9*  < > 8.1*  < > 8.9 9.0 9.4 9.7 9.8  MG 1.4*  --  1.8  --  2.0  --   --   --  2.1  PHOS 2.9  --  2.6  --   --   --   --   --  4.9*  < > = values in this interval not displayed. Liver Function Tests:  Recent Labs Lab 09/10/15 0718  AST 22  ALT 32  ALKPHOS 68  BILITOT 0.8  PROT 7.3  ALBUMIN 3.3*   No results for input(s): LIPASE, AMYLASE in the last 168 hours.  Recent Labs Lab 09/09/15 1035  AMMONIA 36*   CBC:  Recent Labs Lab 09/07/15 0422 09/08/15 0456 09/09/15 0929 09/10/15 0718 09/11/15 0435  WBC 9.1 9.8 9.9 9.1 11.0*  HGB 10.1* 11.3* 12.4* 13.4 13.4  HCT 30.8* 34.0* 37.9* 40.4 41.1  MCV 88.8 88.8 90.9 91.2 91.7  PLT 210 293 378 355 445*   Cardiac Enzymes: No results for input(s): CKTOTAL, CKMB, CKMBINDEX, TROPONINI in the last 168 hours. BNP (last 3 results) No results for input(s): BNP in the last 8760 hours.  ProBNP (last 3 results) No results for input(s): PROBNP in the last 8760 hours.  CBG:  Recent Labs Lab 09/08/15 0330 09/08/15 0821 09/08/15 1135 09/08/15 1551 09/08/15 1920  GLUCAP 94 116* 105* 103* 128*    Recent Results (from the past 240 hour(s))  MRSA PCR Screening     Status: None   Collection Time: 09/03/15 11:12 AM  Result Value Ref Range Status   MRSA by PCR NEGATIVE NEGATIVE Final    Comment:        The GeneXpert MRSA Assay (FDA approved for NASAL specimens only), is one component of a comprehensive MRSA colonization surveillance program. It is not intended to diagnose  MRSA infection nor to guide or monitor treatment for MRSA infections.   Culture, respiratory (NON-Expectorated)     Status: None   Collection Time: 09/03/15  8:00 PM  Result Value Ref Range Status   Specimen Description ENDOTRACHEAL  Final   Special Requests NONE  Final   Gram Stain   Final    ABUNDANT WBC PRESENT, PREDOMINANTLY PMN RARE SQUAMOUS EPITHELIAL CELLS PRESENT MODERATE GRAM VARIABLE ROD FEW GRAM POSITIVE  COCCI IN PAIRS    Culture Consistent with normal respiratory flora.  Final   Report Status 09/05/2015 FINAL  Final  Culture, blood (Routine X 2) w Reflex to ID Panel     Status: None   Collection Time: 09/06/15  2:41 AM  Result Value Ref Range Status   Specimen Description BLOOD RIGHT ANTECUBITAL  Final   Special Requests BOTTLES DRAWN AEROBIC AND ANAEROBIC 5CC  Final   Culture NO GROWTH 5 DAYS  Final   Report Status 09/11/2015 FINAL  Final  Culture, blood (Routine X 2) w Reflex to ID Panel     Status: None   Collection Time: 09/06/15  2:44 AM  Result Value Ref Range Status   Specimen Description BLOOD RIGHT ANTECUBITAL  Final   Special Requests BOTTLES DRAWN AEROBIC AND ANAEROBIC 5CC  Final   Culture NO GROWTH 5 DAYS  Final   Report Status 09/11/2015 FINAL  Final  Culture, respiratory (NON-Expectorated)     Status: None   Collection Time: 09/06/15  3:26 AM  Result Value Ref Range Status   Specimen Description TRACHEAL ASPIRATE  Final   Special Requests Normal  Final   Gram Stain   Final    MODERATE WBC PRESENT,BOTH PMN AND MONONUCLEAR RARE SQUAMOUS EPITHELIAL CELLS PRESENT NO ORGANISMS SEEN    Culture FEW CANDIDA ALBICANS  Final   Report Status 09/10/2015 FINAL  Final     Studies: No results found.  Scheduled Meds: . Buprenorphine HCl-Naloxone HCl  1 Film Sublingual QHS  . clonazePAM  0.5 mg Oral BID  . cloNIDine  0.1 mg Oral BID  . enoxaparin (LOVENOX) injection  40 mg Subcutaneous Q24H  . feeding supplement (ENSURE ENLIVE)  237 mL Oral BID BM  .  methylphenidate  30 mg Oral Daily   Continuous Infusions:   Principal Problem:   Polysubstance abuse Active Problems:   Altered mental status   Respiratory failure (HCC)   Atelectasis   Respiratory failure, acute (HCC)    Time spent: 30    Haydee Salter  [facility] Pager [phone]. If 7PM-7AM, please contact night-coverage at [email or web address], password [facility] 09/11/2015, 6:45 PM  LOS: 8 days

## 2015-09-12 ENCOUNTER — Inpatient Hospital Stay (HOSPITAL_COMMUNITY): Payer: Medicaid Other

## 2015-09-12 DIAGNOSIS — R531 Weakness: Secondary | ICD-10-CM

## 2015-09-12 LAB — BASIC METABOLIC PANEL
ANION GAP: 8 (ref 5–15)
BUN: 12 mg/dL (ref 6–20)
CALCIUM: 9.8 mg/dL (ref 8.9–10.3)
CO2: 31 mmol/L (ref 22–32)
Chloride: 98 mmol/L — ABNORMAL LOW (ref 101–111)
Creatinine, Ser: 0.72 mg/dL (ref 0.61–1.24)
Glucose, Bld: 98 mg/dL (ref 65–99)
POTASSIUM: 3.7 mmol/L (ref 3.5–5.1)
SODIUM: 137 mmol/L (ref 135–145)

## 2015-09-12 LAB — CBC WITH DIFFERENTIAL/PLATELET
BASOS ABS: 0 10*3/uL (ref 0.0–0.1)
BASOS PCT: 0 %
EOS PCT: 3 %
Eosinophils Absolute: 0.3 10*3/uL (ref 0.0–0.7)
HEMATOCRIT: 41.1 % (ref 39.0–52.0)
Hemoglobin: 13.4 g/dL (ref 13.0–17.0)
LYMPHS PCT: 25 %
Lymphs Abs: 2.4 10*3/uL (ref 0.7–4.0)
MCH: 30 pg (ref 26.0–34.0)
MCHC: 32.6 g/dL (ref 30.0–36.0)
MCV: 91.9 fL (ref 78.0–100.0)
Monocytes Absolute: 1.1 10*3/uL — ABNORMAL HIGH (ref 0.1–1.0)
Monocytes Relative: 12 %
NEUTROS ABS: 5.7 10*3/uL (ref 1.7–7.7)
Neutrophils Relative %: 60 %
PLATELETS: 436 10*3/uL — AB (ref 150–400)
RBC: 4.47 MIL/uL (ref 4.22–5.81)
RDW: 13.3 % (ref 11.5–15.5)
WBC: 9.6 10*3/uL (ref 4.0–10.5)

## 2015-09-12 LAB — D-DIMER, QUANTITATIVE (NOT AT ARMC): D DIMER QUANT: 0.69 ug{FEU}/mL — AB (ref 0.00–0.50)

## 2015-09-12 LAB — MAGNESIUM: MAGNESIUM: 2.1 mg/dL (ref 1.7–2.4)

## 2015-09-12 LAB — PHOSPHORUS: Phosphorus: 4.3 mg/dL (ref 2.5–4.6)

## 2015-09-12 MED ORDER — ENOXAPARIN SODIUM 80 MG/0.8ML ~~LOC~~ SOLN
1.0000 mg/kg | Freq: Once | SUBCUTANEOUS | Status: AC
  Administered 2015-09-12: 65 mg via SUBCUTANEOUS
  Filled 2015-09-12: qty 0.8

## 2015-09-12 MED ORDER — SODIUM CHLORIDE 0.9 % IV SOLN
INTRAVENOUS | Status: AC
  Administered 2015-09-12 – 2015-09-13 (×2): via INTRAVENOUS

## 2015-09-12 MED ORDER — IOPAMIDOL (ISOVUE-370) INJECTION 76%
INTRAVENOUS | Status: AC
  Administered 2015-09-12: 100 mL
  Filled 2015-09-12: qty 100

## 2015-09-12 MED ORDER — WHITE PETROLATUM GEL
Status: AC
  Administered 2015-09-12: 1
  Filled 2015-09-12: qty 1

## 2015-09-12 NOTE — Evaluation (Signed)
Occupational Therapy Evaluation Patient Details Name: [name] MRN: [id number] DOB: May 17, 1980 Today's Date: 09/12/2015    History of Present Illness 35 yo with VDRF 7/15 from aspiration setting of drug withdrawal, altered mental status. Self extubated 7/16>> emergent re-intubation; near arrest from left lung mucus plugging, emergent bronch for atelectasis.   Clinical Impression   PT admitted with VDRF due to drug withdrawal. Pt currently with functional limitiations due to the deficits listed below (see OT problem list). PTA was admitted 7/10 and d/c from [place] on 7/14 and returned in several hours due to respiratory distress. Pt was independent with all adls until use of new drug started 2 months ago.  Pt will benefit from skilled OT to increase their independence and safety with adls and balance to allow discharge CIR .     Follow Up Recommendations  CIR    Equipment Recommendations  Other (comment) (defer to CIR =--- RW )    Recommendations for Other Services Rehab consult     Precautions / Restrictions Precautions Precautions: Fall Restrictions Weight Bearing Restrictions: No      Mobility Bed Mobility Overal bed mobility: Needs Assistance Bed Mobility: Supine to Sit     Supine to sit: Min assist     General bed mobility comments: cues to sequence with light tactile input   Transfers Overall transfer level: Needs assistance Equipment used: Rolling walker (2 wheeled) Transfers: Sit to/from Stand Sit to Stand: Mod assist         General transfer comment: pt with widen base of support and prolonged static stand due to report of dizziness prior to progressing to sink level .    Balance Overall balance assessment: Needs assistance Sitting-balance support: Bilateral upper extremity supported;Feet supported Sitting balance-Leahy Scale: Fair     Standing balance support: Single extremity supported;During functional activity Standing  balance-Leahy Scale: Poor                              ADL Overall ADL's : Needs assistance/impaired Eating/Feeding: Set up   Grooming: Cueing for sequencing Grooming Details (indicate cue type and reason): pt asked to turn water off twice during session. pt states "okay" but no action completed. Therapist asking patient what was happening and patient states "i am trying to turn off water" pt just static standing with static gaze stare             Lower Body Dressing: Min guard;Bed level Lower Body Dressing Details (indicate cue type and reason): pt needed incr time and effort to complete LB don socks.  Toilet Transfer: Moderate assistance           Functional mobility during ADLs: Moderate assistance General ADL Comments: pt s mother present and begging therapist to keep patient at [facility]. OT explained that while he is acutely admitting OT will follow but admission / discharge are decided by MD not therapist. Mother again begged " please I am scared to deatht to take him home. He isn't ready"      Vision Vision Assessment?: No apparent visual deficits   Perception     Praxis      Pertinent Vitals/Pain Pain Assessment:  (dizzinesss reported)     Hand Dominance Right   Extremity/Trunk Assessment Upper Extremity Assessment Upper Extremity Assessment: Generalized weakness   Lower Extremity Assessment Lower Extremity Assessment: Defer to PT evaluation   Cervical / Trunk Assessment Cervical / Trunk Assessment: Kyphotic  Communication Communication Communication: No difficulties   Cognition Arousal/Alertness: Awake/alert Behavior During Therapy: Flat affect Overall Cognitive Status: Impaired/Different from baseline Area of Impairment: Awareness           Awareness: Anticipatory   General Comments: pt reports "i am thinking it but I can't do it" pt reports deficits with motor output to complete verbal command by therapist. Pt verbalized the correcta  action but with > 20 second delay to complete task   General Comments       Exercises       Shoulder Instructions      Home Living Family/patient expects to be discharged to:: Private residence Living Arrangements: Parent Available Help at Discharge: Family;Available 24 hours/day Type of Home: House Home Access: Stairs to enter [employer] of Steps: 5 Entrance Stairs-Rails: None Home Layout: One level     Bathroom Shower/Tub: Chief Strategy Officer: Standard     Home Equipment: None   Additional Comments: patient reports balance deficits started suddenly      Prior Functioning/Environment Level of Independence: Needs assistance    ADL's / Homemaking Assistance Needed: prior to admission was independent- more dependence since admission 7/10        OT Diagnosis: Generalized weakness;Cognitive deficits   OT Problem List: Decreased strength;Decreased activity tolerance;Impaired balance (sitting and/or standing);Decreased coordination;Decreased cognition;Decreased safety awareness;Decreased knowledge of use of DME or AE;Decreased knowledge of precautions   OT Treatment/Interventions: Self-care/ADL training;Therapeutic exercise;Neuromuscular education;DME and/or AE instruction;Therapeutic activities;Cognitive remediation/compensation;Patient/family education;Balance training    OT Goals(Current goals can be found in the care plan section) Acute Rehab OT Goals Patient Stated Goal: to go home with mom OT Goal Formulation: With patient Time For Goal Achievement: 09/26/15 Potential to Achieve Goals: Good ADL Goals Pt Will Perform Upper Body Bathing: with min guard assist;sitting Additional ADL Goal #1: Pt will complete DGI with score 19 or greater to indicate decr fall risk during adls Additional ADL Goal #2: Pt will complete 3 step adl task with supervision and no cues   OT Frequency: Min 2X/week   Barriers to D/C: Decreased caregiver  support  mother is disabled and able to provide S level support but patient must be MOD I to d/c home with mother        Co-evaluation              End of Session Equipment Utilized During Treatment: Gait belt;Rolling walker Nurse Communication: Mobility status;Precautions  Activity Tolerance: Patient tolerated treatment well Patient left: Other (comment) (PT Ashly ambulating into hallway)   Time: 1032-1050 OT Time Calculation (min): 18 min Charges:  OT General Charges $OT Visit: 1 Procedure OT Evaluation $OT Eval Moderate Complexity: 1 Procedure G-Codes:    Boone Master B 09/20/15, 11:16 AM   Mateo Flow   OTR/L Pager: [phone] Office: [phone] .

## 2015-09-12 NOTE — Progress Notes (Signed)
CCMD notified patient has ST elevation. Patient assessed, HR 122 on telemetry box, no adventitious heart sounds, patient denies any pain, in bed resting comfortably. MD was notified, ECG obtained, notified MD of Ddimer 0.69. CT transport on way to pick up patient. MD spoke with patient, explained results, testing. Patient vocalized understanding. Will continue to monitor closely.

## 2015-09-12 NOTE — Progress Notes (Signed)
Physical Therapy Treatment Patient Details Name: [name] MRN: [id number] DOB: 09/25/1980 Today's Date: 09/12/2015    History of Present Illness 35 yo with VDRF 7/15 from aspiration setting of drug withdrawal, altered mental status. Self extubated 7/16>> emergent re-intubation; near arrest from left lung mucus plugging, emergent bronch for atelectasis.    PT Comments    Pt able to tolerate ambulation this date with use of RW and minA. Pt remains to have impaired processing, impaired balance, decreased strength x 4 extremities, generalized weakness, and at increased falls risk. Pt to benefit from CIR upon d/c to achieve safe mod I level of function to be able to attend drug rehab clinic.  Follow Up Recommendations  CIR;Supervision/Assistance - 24 hour     Equipment Recommendations  Rolling walker with 5" wheels;3in1 (PT);Wheelchair (measurements PT);Wheelchair cushion (measurements PT);Hospital bed    Recommendations for Other Services Rehab consult     Precautions / Restrictions Precautions Precautions: Fall Restrictions Weight Bearing Restrictions: No    Mobility  Bed Mobility Overal bed mobility: Needs Assistance Bed Mobility: Supine to Sit     Supine to sit: Min assist     General bed mobility comments: v/c's for LE management, minA for LE management of EOB, tactile cues to trunk to perform elevation  Transfers Overall transfer level: Needs assistance Equipment used: None Transfers: Sit to/from Stand Sit to Stand: Min assist         General transfer comment: pt impulsively stood up with assist of PT and OT, pt shaky  Ambulation/Gait Ambulation/Gait assistance: Min assist Ambulation Distance (Feet): 150 Feet Assistive device: Rolling walker (2 wheeled);2 person hand held assist Gait Pattern/deviations: Step-through pattern;Decreased stride length;Wide base of support Gait velocity: slow Gait velocity interpretation: <1.8 ft/sec, indicative of risk  for recurrent falls General Gait Details: pt initially with bilat HHA, pt with short shuffled steps, very guarded,/unsteady. pt given RW, unable to grip fully with hands, pt with bilat LE External rotations and remains to have short step length. minA for walker management especially around obstacles. pt unable to process how to use RW appropriately   Stairs            Wheelchair Mobility    Modified Rankin (Stroke Patients Only)       Balance Overall balance assessment: Needs assistance Sitting-balance support: Bilateral upper extremity supported Sitting balance-Leahy Scale: Fair Sitting balance - Comments: Needed bil UE support to balance.    Standing balance support: Single extremity supported Standing balance-Leahy Scale: Poor Standing balance comment: pt stood at sink with minA for balance to work on ADLs with OT                    Cognition Arousal/Alertness: Awake/alert Behavior During Therapy: Flat affect Overall Cognitive Status: Impaired/Different from baseline Area of Impairment: Following commands;Safety/judgement;Awareness;Problem solving     Memory: Decreased short-term memory Following Commands: Follows one step commands with increased time Safety/Judgement: Decreased awareness of safety;Decreased awareness of deficits Awareness: Anticipatory Problem Solving: Slow processing;Decreased initiation;Difficulty sequencing General Comments: pt reports "i am thinking it but I can't do it" pt reports deficits with motor output to complete verbal command by therapist. Pt verbalized the correcta action but with > 20 second delay to complete task    Exercises      General Comments        Pertinent Vitals/Pain Pain Assessment: No/denies pain    Home Living Family/patient expects to be discharged to:: Private residence Living Arrangements: Parent Available Help at  Discharge: Family;Available 24 hours/day Type of Home: House Home Access: Stairs to  enter Entrance Stairs-Rails: None Home Layout: One level Home Equipment: None Additional Comments: patient reports balance deficits started suddenly    Prior Function Level of Independence: Needs assistance    ADL's / Homemaking Assistance Needed: prior to admission was independent- more dependence since admission 7/10     PT Goals (current goals can now be found in the care plan section) Acute Rehab PT Goals Patient Stated Goal: go to rehab Progress towards PT goals: Progressing toward goals    Frequency  Min 3X/week    PT Plan Current plan remains appropriate    Co-evaluation PT/OT/SLP Co-Evaluation/Treatment: Yes   PT goals addressed during session: Mobility/safety with mobility       End of Session Equipment Utilized During Treatment: Gait belt Activity Tolerance: Patient tolerated treatment well Patient left: in chair;with call bell/phone within reach;with chair alarm set;with family/visitor present     Time: 1610-9604 PT Time Calculation (min) (ACUTE ONLY): 30 min  Charges:  $Gait Training: 8-22 mins                    G Codes:      Marcene Brawn 09/12/2015, 12:01 PM  Lewis Shock, PT, DPT Pager #: [phone] Office #: [phone]

## 2015-09-12 NOTE — Progress Notes (Signed)
[facility] PROGRESS NOTE  [name] ZOX:[id number] DOB: May 20, 1980 DOA: 09/02/2015 PCP: Kaleen Mask, MD  Brief Narrative:  35 yo male with hx of benzo, opiate, THC abuse was trying to do detox readmitted with lethargy. Developed acute hypoxic respiratory failure with concern for aspiration and transferred to ICU. Later extubated. Still lethargic per report. This appears to be selective behavior on pt's part.  Significant Events: 7/14 Admit 7/15 To ICU, VDRF 7/16 self extubated - emergent re-intubation; near arrest from left lung mucus plugging, emergent bronch for atelectasis 7/20 EEG - no acute findings 7/22 Transfer to tele  Subjective:  Patient complains of being weak. Patient is anxious about discharge. Wants to go to inpatient rehab. Denies not a fever.  Assessment & Plan:  Acute hypoxic respiratory failure - Sepsis due to LLL Aspiration pneumonia  Stabilizing clinically - sats normal range on RA - sepsis physiology has resolved   Acute encephalopathy with concern for drug withdrawal vs overdose; resolved Mental status before withwax and wane w/o clear cause - ?psychogenic - RPR and HIV negative - ammonia not signif elevated - TSH normal - ABG not signifigant abnormal - not altered currently  Sinus tachycardia  Likely related to narcotic and benzo withdrawal - cont clonidine and follow  Greatly improved over last few days D-dimer pos, check CTA, lovenox given  Seizure on 7/20 CT head neg 7/15 - no evidence of further seizure activity  EEG results reviewed  Intermittent hypotension BP appears to have stabilized   Hypokalemia Replaced prev, will monitor level  Hypophosphatemia Normalized at time of last check, intermittent recheck  Protein calorie malnutrition Advised to to take daily supplements  Hx of polysubstance abuse - opioids, benzos, tobacco  Stopped home Etizolam 150-200 mg/day (bought online from [place]) - resumed Suboxone  at 8 mg daily + home Clonazepam 0.5 mg BID  PSYCHIATRIC Has been following with a Psychiatrist for the last 5 years - no official psychiatric diagnosis besides substance abuse - has been a part of a12 step program and on Suboxone 23 mg daily - Dr. Robby Sermon does not believe he was ever suicidal over the years or depressed (Dr. Barth Kirks # is [phone])  DVT prophylaxis: lovenox  Code Status: FULL CODE Family Communication: no family present at time of exam today   Disposition Plan: transfer to neuro tele bed - PT/OT   Consultants:  PCCM - signed off Neurology - signe doff  Antimicrobials:  Unasyn 7/15 > 7/21  Procedure: EEG 7/20 IMPRESSION:  This EEG demonstrated no focal, hemispheric, or lateralizing features.  No epileptiform activity was recorded.  There was an excessive amount of fast (beta) activity noted throughout the recording, which is often due to medication effect.  This is usually due to medication such as benzodiazepines.  Correlate clinically.  HPI/Subjective:  States he feels weak but can do more. GF left hospital after heated discussion. No acute issues per nursing.  Objective: Vitals:   09/12/15 1911 09/12/15 2118  BP: 115/65 102/67  Pulse: 100 (!) 108  Resp: 16 20  Temp: 98.6 F (37 C) 98.5 F (36.9 C)    Intake/Output Summary (Last 24 hours) at 09/12/15 2310 Last data filed at 09/12/15 2000  Gross per 24 hour  Intake              120 ml  Output              750 ml  Net             -  630 ml   Filed Weights   09/10/15 0250 09/11/15 0500 09/12/15 0515  Weight: 62.2 kg (137 lb 2 oz) 67.9 kg (149 lb 11.1 oz) 66.8 kg (147 lb 4.3 oz)    Exam:  General:  No diaphoresis, anxious, no acute distress Cardiovascular:tachy rate and reg rhythm no murmurs rubs or gallops Respiratory: Clear to auscultation bilaterally no more breathing Abdomen: Nondistended bowel sounds normal nontender palpation Musculoskeletal: Moving all extremities, no deformity,  4 out of 5 strength (effort?)  Data Reviewed: Basic Metabolic Panel:  Recent Labs Lab 09/07/15 0422  09/08/15 0745 09/09/15 0221 09/09/15 0929 09/10/15 0718 09/11/15 0435 09/12/15 0529  NA 139  < > 140 138 140 141 139 137  K 3.9  < > 3.4* 2.9* 3.7 3.5 3.5 3.7  CL 104  < > 103 102 104 102 102 98*  CO2 28  < > 25 27 26 27 28 31   GLUCOSE 110*  < > 118* 109* 102* 88 111* 98  BUN 10  < > 7 6 <5* 9 14 12   CREATININE 0.43*  < > 0.75 0.55* 0.59* 0.71 0.69 0.72  CALCIUM 8.1*  < > 8.9 9.0 9.4 9.7 9.8 9.8  MG 1.8  --  2.0  --   --   --  2.1 2.1  PHOS 2.6  --   --   --   --   --  4.9* 4.3  < > = values in this interval not displayed. Liver Function Tests:  Recent Labs Lab 09/10/15 0718  AST 22  ALT 32  ALKPHOS 68  BILITOT 0.8  PROT 7.3  ALBUMIN 3.3*   No results for input(s): LIPASE, AMYLASE in the last 168 hours.  Recent Labs Lab 09/09/15 1035  AMMONIA 36*   CBC:  Recent Labs Lab 09/08/15 0456 09/09/15 0929 09/10/15 0718 09/11/15 0435 09/12/15 0529  WBC 9.8 9.9 9.1 11.0* 9.6  NEUTROABS  --   --   --   --  5.7  HGB 11.3* 12.4* 13.4 13.4 13.4  HCT 34.0* 37.9* 40.4 41.1 41.1  MCV 88.8 90.9 91.2 91.7 91.9  PLT 293 378 355 445* 436*   Cardiac Enzymes: No results for input(s): CKTOTAL, CKMB, CKMBINDEX, TROPONINI in the last 168 hours. BNP (last 3 results) No results for input(s): BNP in the last 8760 hours.  ProBNP (last 3 results) No results for input(s): PROBNP in the last 8760 hours.  CBG:  Recent Labs Lab 09/08/15 0330 09/08/15 0821 09/08/15 1135 09/08/15 1551 09/08/15 1920  GLUCAP 94 116* 105* 103* 128*    Recent Results (from the past 240 hour(s))  MRSA PCR Screening     Status: None   Collection Time: 09/03/15 11:12 AM  Result Value Ref Range Status   MRSA by PCR NEGATIVE NEGATIVE Final    Comment:        The GeneXpert MRSA Assay (FDA approved for NASAL specimens only), is one component of a comprehensive MRSA colonization surveillance  program. It is not intended to diagnose MRSA infection nor to guide or monitor treatment for MRSA infections.   Culture, respiratory (NON-Expectorated)     Status: None   Collection Time: 09/03/15  8:00 PM  Result Value Ref Range Status   Specimen Description ENDOTRACHEAL  Final   Special Requests NONE  Final   Gram Stain   Final    ABUNDANT WBC PRESENT, PREDOMINANTLY PMN RARE SQUAMOUS EPITHELIAL CELLS PRESENT MODERATE GRAM VARIABLE ROD FEW GRAM POSITIVE COCCI IN PAIRS  Culture Consistent with normal respiratory flora.  Final   Report Status 09/05/2015 FINAL  Final  Culture, blood (Routine X 2) w Reflex to ID Panel     Status: None   Collection Time: 09/06/15  2:41 AM  Result Value Ref Range Status   Specimen Description BLOOD RIGHT ANTECUBITAL  Final   Special Requests BOTTLES DRAWN AEROBIC AND ANAEROBIC 5CC  Final   Culture NO GROWTH 5 DAYS  Final   Report Status 09/11/2015 FINAL  Final  Culture, blood (Routine X 2) w Reflex to ID Panel     Status: None   Collection Time: 09/06/15  2:44 AM  Result Value Ref Range Status   Specimen Description BLOOD RIGHT ANTECUBITAL  Final   Special Requests BOTTLES DRAWN AEROBIC AND ANAEROBIC 5CC  Final   Culture NO GROWTH 5 DAYS  Final   Report Status 09/11/2015 FINAL  Final  Culture, respiratory (NON-Expectorated)     Status: None   Collection Time: 09/06/15  3:26 AM  Result Value Ref Range Status   Specimen Description TRACHEAL ASPIRATE  Final   Special Requests Normal  Final   Gram Stain   Final    MODERATE WBC PRESENT,BOTH PMN AND MONONUCLEAR RARE SQUAMOUS EPITHELIAL CELLS PRESENT NO ORGANISMS SEEN    Culture FEW CANDIDA ALBICANS  Final   Report Status 09/10/2015 FINAL  Final     Studies: Ct Angio Chest Pe W Or Wo Contrast  Result Date: 09/12/2015 CLINICAL DATA:  Tachycardia. Respiratory failure. Sepsis and left lower lobe aspiration earlier during this hospital admission. EXAM: CT ANGIOGRAPHY CHEST WITH CONTRAST TECHNIQUE:  Multidetector CT imaging of the chest was performed using the standard protocol during bolus administration of intravenous contrast. Multiplanar CT image reconstructions and MIPs were obtained to evaluate the vascular anatomy. CONTRAST:  100 mL Isovue 370 COMPARISON:  Chest radiograph 09/09/2015 FINDINGS: Mediastinum/Lymph Nodes: No pulmonary emboli or thoracic aortic dissection identified. No masses or pathologically enlarged lymph nodes identified. Lungs/Pleura: The major airways are patent. A small amount of mucus is noted in the upper trachea. Evaluation of the lung parenchyma is lot mildly limited by motion artifact. There is likely faint scratched of there is faint scratched of there small areas of faint ground-glass opacity scattered throughout the left lower lobe with likely mild centrilobular nodularity. There is minimal dependent atelectasis in the lung bases. A 3 mm nodule in the left lower lobe is likely benign given patient's age and no history of prior malignancy. There is no pleural effusion. Upper abdomen: No acute findings. Musculoskeletal: There are mild superior endplate compression deformities involving the T1, T2, T3, T4, T5, and T9 vertebral bodies. Review of the MIP images confirms the above findings. IMPRESSION: 1. No evidence of pulmonary emboli. 2. Minimal left lower lobe ground-glass nodularity, likely resolving changes of patient's clinical aspiration and left lower lobe collapse earlier this admission. 3. Multiple mild thoracic vertebral superior endplate fractures, age indeterminate. Electronically Signed   By: Sebastian Ache M.D.   On: 09/12/2015 17:17   Scheduled Meds: . Buprenorphine HCl-Naloxone HCl  1 Film Sublingual QHS  . clonazePAM  0.5 mg Oral BID  . cloNIDine  0.1 mg Oral BID  . enoxaparin (LOVENOX) injection  1 mg/kg Subcutaneous Once  . feeding supplement (ENSURE ENLIVE)  237 mL Oral BID BM  . methylphenidate  30 mg Oral Daily   Continuous Infusions: . sodium  chloride 100 mL/hr at 09/12/15 1805    Principal Problem:   Polysubstance abuse Active Problems:  Altered mental status   Respiratory failure (HCC)   Atelectasis   Respiratory failure, acute (HCC)   Weakness    Time spent: 30    Haydee Salter  [facility] Pager [phone]. If 7PM-7AM, please contact night-coverage at [email or web address], password [facility] 09/12/2015, 11:10 PM  LOS: 9 days

## 2015-09-12 NOTE — [place] FL2 (Signed)
[place] MEDICAID FL2 LEVEL OF CARE SCREENING TOOL     IDENTIFICATION  Patient Name: [name] Birthdate: 05-16-1980 Sex: male Admission Date (Current Location): 09/02/2015  [facility] and [place] Number:  Producer, television/film/video and Address:  The . [facility], [address] [place], [place] [address]      Provider Number: [id number]  Attending Physician Name and Address:  Haydee Salter, MD  Relative Name and Phone Number:  Aggie Cosier    Current Level of Care: Hospital Recommended Level of Care: Skilled Nursing Facility Prior Approval Number:    Date Approved/Denied:   PASRR Number:    Discharge Plan: SNF    Current Diagnoses: Patient Active Problem List   Diagnosis Date Noted  . Weakness 09/12/2015  . Respiratory failure, acute (HCC)   . Respiratory failure (HCC)   . Atelectasis   . Altered mental status 09/03/2015  . Opioid overdose 08/29/2015  . Leukocytosis 08/29/2015  . Anemia 08/29/2015  . Polysubstance abuse 08/29/2015  . Tobacco use disorder 08/29/2015    Orientation RESPIRATION BLADDER Height & Weight     Self, Time, Place, Situation  Normal Incontinent Weight: 66.8 kg (147 lb 4.3 oz) Height:  5\' 11"  (180.3 cm)  BEHAVIORAL SYMPTOMS/MOOD NEUROLOGICAL BOWEL NUTRITION STATUS      Continent Diet (Please see DC Summary)  AMBULATORY STATUS COMMUNICATION OF NEEDS Skin   Supervision Verbally Normal                       Personal Care Assistance Level of Assistance  Bathing, Feeding, Dressing Bathing Assistance: Limited assistance Feeding assistance: Independent Dressing Assistance: Independent     Functional Limitations Info             SPECIAL CARE FACTORS FREQUENCY  PT (By licensed PT)     PT Frequency: min 3x/week              Contractures      Additional Factors Info  Code Status, Allergies Code Status Info: Full Allergies Info:  Compazine Prochlorperazine Edisylate, Nsaids, Phenergan Promethazine  Hcl, Tramadol           Current Medications (09/12/2015):  This is the current hospital active medication list Current Facility-Administered Medications  Medication Dose Route Frequency Provider Last Rate Last Dose  . iopamidol (ISOVUE-370) 76 % injection           . acetaminophen (TYLENOL) solution 650 mg  650 mg Oral Q6H PRN Lonia Blood, MD      . albuterol (PROVENTIL) (2.5 MG/3ML) 0.083% nebulizer solution 2.5 mg  2.5 mg Nebulization Q2H PRN Lonia Blood, MD      . bisacodyl (DULCOLAX) suppository 10 mg  10 mg Rectal Daily PRN Coralyn Helling, MD      . Buprenorphine HCl-Naloxone HCl 8-2 MG FILM 1 Film  1 Film Sublingual QHS Karl Ito, MD   1 Film at 09/11/15 1804  . clonazePAM (KLONOPIN) tablet 0.5 mg  0.5 mg Oral BID Oretha Milch, MD   0.5 mg at 09/12/15 1021  . cloNIDine (CATAPRES) tablet 0.1 mg  0.1 mg Oral BID Lonia Blood, MD   0.1 mg at 09/12/15 1021  . docusate (COLACE) 50 MG/5ML liquid 100 mg  100 mg Per Tube BID PRN Coralyn Helling, MD      . enoxaparin (LOVENOX) injection 40 mg  40 mg Subcutaneous Q24H Hillary Bow, DO   40 mg at 09/11/15 1804  .  feeding supplement (ENSURE ENLIVE) (ENSURE ENLIVE) liquid 237 mL  237 mL Oral BID BM Jeffrey T McClung, MLonia Blood07/24/17 1347  . LORazepam (ATIVAN) injection 1-2 mg  1-2 mg Intravenous Q15 min PRN Roslynn Amble, MD      . methylphenidate (RITALIN) tablet 30 mg  30 mg Oral Daily Lonia Blood, MD   30 mg at 09/11/15 6045     Discharge Medications: Please see discharge summary for a list of discharge medications.  Relevant Imaging Results:  Relevant Lab Results:   Additional Information SSN: [id number] [place], [place]

## 2015-09-12 NOTE — Progress Notes (Signed)
CSW spoke with patient concerning PT recommendation for PT rehab prior to return home.    Pt reports not feeling comfortable going home with current level of weakness and states he will do whatever is recommended- CSW explained that SNF placement might not be local due to [facility] payor and pt is still agreeable but is hopeful for CIR  CSW to fax out referral- will continue to follow   [name], [facility] Clinical Social Worker [phone]

## 2015-09-13 ENCOUNTER — Encounter (HOSPITAL_COMMUNITY): Payer: Self-pay | Admitting: Family Medicine

## 2015-09-13 DIAGNOSIS — F32A Depression, unspecified: Secondary | ICD-10-CM

## 2015-09-13 DIAGNOSIS — R000 Tachycardia, unspecified: Secondary | ICD-10-CM

## 2015-09-13 DIAGNOSIS — D62 Acute posthemorrhagic anemia: Secondary | ICD-10-CM

## 2015-09-13 DIAGNOSIS — F329 Major depressive disorder, single episode, unspecified: Secondary | ICD-10-CM

## 2015-09-13 DIAGNOSIS — F4310 Post-traumatic stress disorder, unspecified: Secondary | ICD-10-CM

## 2015-09-13 DIAGNOSIS — R531 Weakness: Secondary | ICD-10-CM

## 2015-09-13 DIAGNOSIS — Z720 Tobacco use: Secondary | ICD-10-CM

## 2015-09-13 LAB — CBC WITH DIFFERENTIAL/PLATELET
Basophils Absolute: 0 10*3/uL (ref 0.0–0.1)
Basophils Relative: 0 %
EOS PCT: 3 %
Eosinophils Absolute: 0.3 10*3/uL (ref 0.0–0.7)
HCT: 39.2 % (ref 39.0–52.0)
Hemoglobin: 12.7 g/dL — ABNORMAL LOW (ref 13.0–17.0)
LYMPHS ABS: 2.5 10*3/uL (ref 0.7–4.0)
Lymphocytes Relative: 25 %
MCH: 29.7 pg (ref 26.0–34.0)
MCHC: 32.4 g/dL (ref 30.0–36.0)
MCV: 91.6 fL (ref 78.0–100.0)
MONO ABS: 0.8 10*3/uL (ref 0.1–1.0)
MONOS PCT: 8 %
Neutro Abs: 6.5 10*3/uL (ref 1.7–7.7)
Neutrophils Relative %: 64 %
PLATELETS: 419 10*3/uL — AB (ref 150–400)
RBC: 4.28 MIL/uL (ref 4.22–5.81)
RDW: 13.2 % (ref 11.5–15.5)
WBC: 10.2 10*3/uL (ref 4.0–10.5)

## 2015-09-13 LAB — BASIC METABOLIC PANEL
Anion gap: 8 (ref 5–15)
BUN: 13 mg/dL (ref 6–20)
CHLORIDE: 101 mmol/L (ref 101–111)
CO2: 26 mmol/L (ref 22–32)
CREATININE: 0.62 mg/dL (ref 0.61–1.24)
Calcium: 9 mg/dL (ref 8.9–10.3)
GFR calc Af Amer: >60 mL/min (ref >60)
GFR calc non Af Amer: >60 mL/min (ref >60)
GLUCOSE: 106 mg/dL — AB (ref 65–99)
Potassium: 3.8 mmol/L (ref 3.5–5.1)
SODIUM: 135 mmol/L (ref 135–145)

## 2015-09-13 LAB — PHOSPHORUS: PHOSPHORUS: 3.5 mg/dL (ref 2.5–4.6)

## 2015-09-13 LAB — MAGNESIUM: Magnesium: 2 mg/dL (ref 1.7–2.4)

## 2015-09-13 MED ORDER — BUPRENORPHINE HCL-NALOXONE HCL 8-2 MG SL FILM: 1.0000 | ORAL_FILM | Freq: Every day | SUBLINGUAL | Status: DC

## 2015-09-13 MED ORDER — CYCLOBENZAPRINE HCL 5 MG PO TABS: 5.0000 mg | ORAL_TABLET | Freq: Three times a day (TID) | ORAL | 0 refills | Status: DC | PRN

## 2015-09-13 MED ORDER — METHYLPHENIDATE HCL 10 MG PO TABS: 30.0000 mg | ORAL_TABLET | Freq: Every day | ORAL | 0 refills | Status: DC

## 2015-09-13 MED ORDER — CLONIDINE HCL 0.1 MG PO TABS: 0.1000 mg | ORAL_TABLET | Freq: Two times a day (BID) | ORAL | 11 refills | Status: DC

## 2015-09-13 MED ORDER — CYCLOBENZAPRINE HCL 10 MG PO TABS
5.0000 mg | ORAL_TABLET | Freq: Three times a day (TID) | ORAL | Status: DC | PRN
Start: 2015-09-13 — End: 2015-09-13

## 2015-09-13 MED ORDER — CYCLOBENZAPRINE HCL 10 MG PO TABS: 5.0000 mg | ORAL_TABLET | Freq: Once | ORAL | Status: DC

## 2015-09-13 NOTE — Progress Notes (Signed)
Pt has been discharged home with family and is supposed to follow up with an inpatient drug rehabilitation program outpatient. Pt does not have a following psychiatrist outside of hospital.  Previous psychiatrist states that she is not sure she will be taking the patient back and even if she did, she is not available for over a week.  [name]

## 2015-09-13 NOTE — Consult Note (Signed)
Physical Medicine and Rehabilitation Consult   Reason for Consult: Debility Referring Physician: Dr. Melynda Ripple   HPI: [name] is a 35 y.o. male with history of depression, PTSD, benzo/opiod/cannabis abuse as well as designer drugs from [place] and recently discharged 7/14.  Mother has administered Suboxone post discharge to home, he developed MS changes with lethargy a few hours later requiring readmission. Mother with concerns and request for drug rehab program.  He developed hypoxia later that day and required intubation due to aspiration in setting of withdrawal. He self extubated on 7/16 with near arrest from mucous plugging requiring bronchoscopy  and emergent reintubation. He was treated with IV unasyn for aspiration PNA and tolerated extubation 07/19. Patient continues to have confusion with waxing and waning of MS and Neurology following for input.  CT head without acute changes and  EEG without seizure activity. CTA chest done yesterday due to ongoing tachycardia and was negative for PE. He continues to have ataxic gait, problems with processing as well as anxiety about discharge to home. CIR recommended for follow up therapy.   Reports that he sat up for the first time yesterday for a couple of hours.  Reporting diffuse pain due to myalgias -BLE, back, arms. Also reporting sore throat and bilateral ear pain.  Lives with mother who's disabled and can provide 24 hours supervision after discharge.    Review of Systems  Constitutional: Positive for malaise/fatigue.  HENT: Positive for ear pain and sore throat. Negative for hearing loss.   Eyes: Positive for blurred vision.  Respiratory: Negative for cough and shortness of breath.   Cardiovascular: Negative for chest pain and palpitations.  Gastrointestinal: Negative for abdominal pain, heartburn and nausea.  Musculoskeletal: Positive for back pain, joint pain, myalgias and neck pain.  Neurological: Positive for dizziness and  weakness. Negative for headaches.  Psychiatric/Behavioral: The patient is nervous/anxious.   All other systems reviewed and are negative.     Past Medical History:  Diagnosis Date  . Anxiety   . Benzodiazepine abuse   . Depression   . Drug use   . History of IBS   . Polysubstance abuse     No past surgical history on file.    No family history on file.    Social History:  reports that he has been smoking Cigarettes.  He has been smoking about 0.50 packs per day. He has never used smokeless tobacco. He reports that he uses drugs, including Marijuana. He reports that he does not drink alcohol.     Allergies  Allergen Reactions  . Compazine [Prochlorperazine Edisylate] Other (See Comments)    Blood pressure drops  . Nsaids Other (See Comments)    unknown  . Phenergan [Promethazine Hcl] Other (See Comments)    Blood pressure drops   . Tramadol Other (See Comments)    Blood pressure drops    Medications Prior to Admission  Medication Sig Dispense Refill  . Buprenorphine HCl-Naloxone HCl (SUBOXONE) 8-2 MG FILM Place 2.5 Film under the tongue every morning. Dissolve 2 & 1/2 films under tongue every morning.    . clonazePAM (KLONOPIN) 0.5 MG tablet Take 1 tablet (0.5 mg total) by mouth 2 (two) times daily. 10 tablet 0  . methylphenidate (RITALIN) 20 MG tablet Take 60 mg by mouth daily.      Home: Home Living Family/patient expects to be discharged to:: Private residence Living Arrangements: Parent Available Help at Discharge: Family, Available 24 hours/day Type of Home: House  Home Access: Stairs to enter Entrance Stairs-Number of Steps: 5 Entrance Stairs-Rails: None Home Layout: One level Bathroom Shower/Tub: Engineer, manufacturing systems: Standard Home Equipment: None Additional Comments: patient reports balance deficits started suddenly  Functional History: Prior Function Level of Independence: Needs assistance Gait / Transfers Assistance Needed: Pt was  needing mod assist for sit to stand and mod assist for gait according to girlfriend.  Pt very unsteady per girlfriend.  ADL's / Homemaking Assistance Needed: prior to admission was independent- more dependence since admission 7/10 Functional Status:  Mobility: Bed Mobility Overal bed mobility: Needs Assistance Bed Mobility: Supine to Sit Supine to sit: Min assist General bed mobility comments: v/c's for LE management, minA for LE management of EOB, tactile cues to trunk to perform elevation Transfers Overall transfer level: Needs assistance Equipment used: None Transfers: Sit to/from Stand Sit to Stand: Min assist Stand pivot transfers: Mod assist General transfer comment: pt impulsively stood up with assist of PT and OT, pt shaky Ambulation/Gait Ambulation/Gait assistance: Min assist Ambulation Distance (Feet): 150 Feet Assistive device: Rolling walker (2 wheeled), 2 person hand held assist Gait Pattern/deviations: Step-through pattern, Decreased stride length, Wide base of support General Gait Details: pt initially with bilat HHA, pt with short shuffled steps, very guarded,/unsteady. pt given RW, unable to grip fully with hands, pt with bilat LE External rotations and remains to have short step length. minA for walker management especially around obstacles. pt unable to process how to use RW appropriately Gait velocity: slow Gait velocity interpretation: <1.8 ft/sec, indicative of risk for recurrent falls    ADL: ADL Overall ADL's : Needs assistance/impaired Eating/Feeding: Set up Grooming: Cueing for sequencing Grooming Details (indicate cue type and reason): pt asked to turn water off twice during session. pt states "okay" but no action completed. Therapist asking patient what was happening and patient states "i am trying to turn off water" pt just static standing with static gaze stare Lower Body Dressing: Min guard, Bed level Lower Body Dressing Details (indicate cue type and  reason): pt needed incr time and effort to complete LB don socks.  Toilet Transfer: Moderate assistance Functional mobility during ADLs: Moderate assistance General ADL Comments: pt s mother present and begging therapist to keep patient at [facility]. OT explained that while he is acutely admitting OT will follow but admission / discharge are decided by MD not therapist. Mother again begged " please I am scared to deatht to take him home. He isn't ready"   Cognition: Cognition Overall Cognitive Status: Impaired/Different from baseline Orientation Level: Oriented X4 Cognition Arousal/Alertness: Awake/alert Behavior During Therapy: Flat affect Overall Cognitive Status: Impaired/Different from baseline Area of Impairment: Following commands, Safety/judgement, Awareness, Problem solving Memory: Decreased short-term memory Following Commands: Follows one step commands with increased time Safety/Judgement: Decreased awareness of safety, Decreased awareness of deficits Awareness: Anticipatory Problem Solving: Slow processing, Decreased initiation, Difficulty sequencing General Comments: pt reports "i am thinking it but I can't do it" pt reports deficits with motor output to complete verbal command by therapist. Pt verbalized the correcta action but with > 20 second delay to complete task  Blood pressure 110/69, pulse (!) 105, temperature 98.1 F (36.7 C), temperature source Oral, resp. rate 18, height 5\' 11"  (1.803 m), weight 67.1 kg (147 lb 14.9 oz), SpO2 98 %. Physical Exam  Nursing note and vitals reviewed. Constitutional: He is oriented to person, place, and time. He appears well-developed and well-nourished. He appears listless. He has a sickly appearance.  HENT:  Head:  Normocephalic and atraumatic.  Mouth/Throat: Oropharynx is clear and moist.  Eyes: Conjunctivae and EOM are normal. Pupils are equal, round, and reactive to light. Right eye exhibits no discharge.  Neck: Normal range of motion.  Neck supple.  Cardiovascular: Regular rhythm.  Tachycardia present.   Respiratory: Effort normal and breath sounds normal. No stridor. No respiratory distress. He has no wheezes.  GI: Soft. Bowel sounds are normal. He exhibits no distension. There is no tenderness.  Musculoskeletal: He exhibits no edema or tenderness.  Neurological: He is oriented to person, place, and time. He appears listless.  Flat affect.  Able to follow basic motor commands without difficulty. No tremors noted.  Sensation intact to light touch Motor: B/l UE: 4/5 proximal to distal RLE: 4/5 proximal to distal LLE: 4-/5 proximal to distal DTRs brisk throughout  Skin: Skin is warm and dry.  Psychiatric: His mood appears anxious. He is slowed.    Results for orders placed or performed during the hospital encounter of 09/02/15 (from the past 24 hour(s))  D-dimer, quantitative (not at [facility])     Status: Abnormal   Collection Time: 09/12/15  1:38 PM  Result Value Ref Range   D-Dimer, Quant 0.69 (H) 0.00 - 0.50 ug/mL-FEU  Basic metabolic panel     Status: Abnormal   Collection Time: 09/13/15  2:59 AM  Result Value Ref Range   Sodium 135 135 - 145 mmol/L   Potassium 3.8 3.5 - 5.1 mmol/L   Chloride 101 101 - 111 mmol/L   CO2 26 22 - 32 mmol/L   Glucose, Bld 106 (H) 65 - 99 mg/dL   BUN 13 6 - 20 mg/dL   Creatinine, Ser 6.60 0.61 - 1.24 mg/dL   Calcium 9.0 8.9 - 60.0 mg/dL   GFR calc non Af Amer >60 >60 mL/min   GFR calc Af Amer >60 >60 mL/min   Anion gap 8 5 - 15  CBC with Differential/Platelet     Status: Abnormal   Collection Time: 09/13/15  2:59 AM  Result Value Ref Range   WBC 10.2 4.0 - 10.5 K/uL   RBC 4.28 4.22 - 5.81 MIL/uL   Hemoglobin 12.7 (L) 13.0 - 17.0 g/dL   HCT 45.9 97.7 - 41.4 %   MCV 91.6 78.0 - 100.0 fL   MCH 29.7 26.0 - 34.0 pg   MCHC 32.4 30.0 - 36.0 g/dL   RDW 23.9 53.2 - 02.3 %   Platelets 419 (H) 150 - 400 K/uL   Neutrophils Relative % 64 %   Neutro Abs 6.5 1.7 - 7.7 K/uL    Lymphocytes Relative 25 %   Lymphs Abs 2.5 0.7 - 4.0 K/uL   Monocytes Relative 8 %   Monocytes Absolute 0.8 0.1 - 1.0 K/uL   Eosinophils Relative 3 %   Eosinophils Absolute 0.3 0.0 - 0.7 K/uL   Basophils Relative 0 %   Basophils Absolute 0.0 0.0 - 0.1 K/uL  Phosphorus     Status: None   Collection Time: 09/13/15  2:59 AM  Result Value Ref Range   Phosphorus 3.5 2.5 - 4.6 mg/dL  Magnesium     Status: None   Collection Time: 09/13/15  2:59 AM  Result Value Ref Range   Magnesium 2.0 1.7 - 2.4 mg/dL   Ct Angio Chest Pe W Or Wo Contrast  Result Date: 09/12/2015 CLINICAL DATA:  Tachycardia. Respiratory failure. Sepsis and left lower lobe aspiration earlier during this hospital admission. EXAM: CT ANGIOGRAPHY CHEST WITH CONTRAST TECHNIQUE: Multidetector  CT imaging of the chest was performed using the standard protocol during bolus administration of intravenous contrast. Multiplanar CT image reconstructions and MIPs were obtained to evaluate the vascular anatomy. CONTRAST:  100 mL Isovue 370 COMPARISON:  Chest radiograph 09/09/2015 FINDINGS: Mediastinum/Lymph Nodes: No pulmonary emboli or thoracic aortic dissection identified. No masses or pathologically enlarged lymph nodes identified. Lungs/Pleura: The major airways are patent. A small amount of mucus is noted in the upper trachea. Evaluation of the lung parenchyma is lot mildly limited by motion artifact. There is likely faint scratched of there is faint scratched of there small areas of faint ground-glass opacity scattered throughout the left lower lobe with likely mild centrilobular nodularity. There is minimal dependent atelectasis in the lung bases. A 3 mm nodule in the left lower lobe is likely benign given patient's age and no history of prior malignancy. There is no pleural effusion. Upper abdomen: No acute findings. Musculoskeletal: There are mild superior endplate compression deformities involving the T1, T2, T3, T4, T5, and T9 vertebral  bodies. Review of the MIP images confirms the above findings. IMPRESSION: 1. No evidence of pulmonary emboli. 2. Minimal left lower lobe ground-glass nodularity, likely resolving changes of patient's clinical aspiration and left lower lobe collapse earlier this admission. 3. Multiple mild thoracic vertebral superior endplate fractures, age indeterminate. Electronically Signed   By: Sebastian Ache M.D.   On: 09/12/2015 17:17   Assessment/Plan: Diagnosis: Debility Labs and images independently reviewed.  Records reviewed and summated above.  1. Does the need for close, 24 hr/day medical supervision in concert with the patient's rehab needs make it unreasonable for this patient to be served in a less intensive setting? No 2. Co-Morbidities requiring supervision/potential complications: depression (depression (ensure mood does not hinder progress of therapies), PTSD, benzo/opiod/cannabis abuse (counsel) Cigarettes (counsel), Tachycardia (monitor in accordance with pain and increasing activity), ABLA (transfuse if necessary to ensure appropriate perfusion for increased activity tolerance) 3. Due to safety, disease management, pain management and patient education, does the patient require 24 hr/day rehab nursing? Potentially 4. Does the patient require coordinated care of a physician, rehab nurse, PT (1-2 hrs/day, 5 days/week) and OT (1-2 hrs/day, 5 days/week) to address physical and functional deficits in the context of the above medical diagnosis(es)? No Addressing deficits in the following areas: balance, endurance, locomotion, strength, transferring, toileting and psychosocial support 5. Can the patient actively participate in an intensive therapy program of at least 3 hrs of therapy per day at least 5 days per week? Yes 6. The potential for patient to make measurable gains while on inpatient rehab is good 7. Anticipated functional outcomes upon discharge from inpatient rehab are n/a  with PT, n/a with  OT, n/a with SLP. 8. Estimated rehab length of stay to reach the above functional goals is: NA 9. Does the patient have adequate social supports and living environment to accommodate these discharge functional goals? N/A 10. Anticipated D/C setting: Other 11. Anticipated post D/C treatments: N/A 12. Overall Rehab/Functional Prognosis: excellent  RECOMMENDATIONS: This patient's condition is appropriate for continued rehabilitative care in the following setting: Based on functional progress between therapy sessions, lack of ongoing medical issues, and review of notes from specialists, inpatient drug rehab program (which it appears pt is also ammenable to now) is the most appropriate discharge disposition for pt.  Patient has agreed to participate in recommended program. Yes Note that insurance prior authorization may be required for reimbursement for recommended care.  Comment: Rehab Admissions Coordinator to follow up.  Maryla Morrow, MD 09/13/2015

## 2015-09-13 NOTE — Care Management Note (Signed)
Case Management Note  Patient Details  Name: LEELEND FORKER MRN: [id number] Date of Birth: 1980-05-29  Subjective/Objective:                    Action/Plan: PT/OT recommending CIR. CIR feels patient is doing well enough to discharge to a drug rehab. CSW and CM following for discharge disposition.   Expected Discharge Date:  09/12/15               Expected Discharge Plan:  Home w Home Health Services  In-House Referral:  Clinical Social Work  Discharge planning Services  CM Consult  Post Acute Care Choice:    Choice offered to:     DME Arranged:    DME Agency:     HH Arranged:    HH Agency:     Status of Service:  In process, will continue to follow  If discussed at Long Length of Stay Meetings, dates discussed:    Additional Comments:  Kermit Balo, RN 09/13/2015, 10:35 AM

## 2015-09-13 NOTE — Progress Notes (Signed)
Inpatient Rehabilitation  Per Dr. Eliane Decree consult note, pt. Most appropriate for inpatient drug rehab program.  I discussed with the patient and he understands.  I will sign off.  Please call if questions.  Weldon Picking PT Inpatient Rehab Admissions Coordinator Cell [phone] Office [phone]

## 2015-09-13 NOTE — Care Management Note (Signed)
Case Management Note  Patient Details  Name: [name] MRN: [id number] Date of Birth: [birth year]  Subjective/Objective:                    Action/Plan: Pt discharging home with his mom and girlfriend. Pt with orders for [facility] services and DME. Pt is not able to receive HHPT/OT with his current diagnosis under his Medicaid (CM spoke with [facility] to verify). Pt with orders for walker, 3 in 1 and wheelchair. Walker and  3 in 1 delivered to his room by Lindsay with [facility] DME. Wheelchair will have to be mailed to his home. Patient aware. Bedside RN updated.   Expected Discharge Date:  09/12/15               Expected Discharge Plan:  Home w Home Health Services  In-House Referral:  Clinical Social Work  Discharge planning Services  CM Consult  Post Acute Care Choice:  Durable Medical Equipment Choice offered to:  Patient  DME Arranged:  3-N-1, Walker rolling, Wheelchair manual DME Agency:  [facility]  HH Arranged:    HH Agency:     Status of Service:  Completed, signed off  If discussed at [employer], dates discussed:    Additional Comments:  Kermit Balo, RN 09/13/2015, 5:49 PM

## 2015-09-13 NOTE — Discharge Summary (Signed)
Physician Discharge Summary  [name] XBJ:[id number] DOB: 1980-04-12 DOA: 09/02/2015  PCP: Jordan Mask, MD  Admit date: 09/02/2015 Discharge date: 09/13/2015  Time spent: 45 minutes  Recommendations for Outpatient Follow-up:  1. Inpt Drug Rehab 2. Follow-up with psychiatry     Discharge Diagnoses:  Principal Problem:   Polysubstance abuse Active Problems:   Altered mental status   Respiratory failure (HCC)   Atelectasis   Respiratory failure, acute (HCC)   Weakness   Depression   PTSD (post-traumatic stress disorder)   Tachycardia   Acute blood loss anemia   Discharge Condition: stable  Diet recommendation: regualr  Filed Weights   09/11/15 0500 09/12/15 0515 09/13/15 0439  Weight: 67.9 kg (149 lb 11.1 oz) 66.8 kg (147 lb 4.3 oz) 67.1 kg (147 lb 14.9 oz)    History of present illness:  [name] is a 35 y.o. male with medical history significant of benzodiazepine abuse, opiate abuse, cannabis abuse, tobacco use disorder, depression, anxiety, PTSD, IBS who comes in emergency department via EMS after his girlfriend noticed that he was very lethargic. He also states that he has had 3 episodes of emesis and 4-5 episodes of diarrhea today from withdrawal symptoms.   Per patient, his lethargy began after taking 8 mg of Suboxone 3 hours while trying to detox from 4-Fluorobutyrfentanyl 89 mg, which he has been taking 3-4 times a day, took last yesterday evening around 19:00, and 60 mg of etizolam 5 hours prior to the beginning of symptoms. He has been taking the etizolam about 3 times a day. Both of these agents are designer drugs, which he has been obtaining from the Internet. He states that he would like to get into a detoxification program. He denies depressive symptoms, suicidal or homicidal ideas.  Hospital Course:  35 yo male with hx of benzo, opiate, THC abuse was trying to do detox readmitted with lethargy. Developed acute hypoxic respiratory failure  with concern for aspiration and transferred to ICU. Extubated self. Transferred to floor later during admit. Abx for pna unazyn for 7 days. Then pt had eval for IP rehab placement. Pt still on drug withdrawal titration medications. Has some tachycardia. Pt weak on exam but seems to be due to effort. Rehab team feels drug rehab is better for pt than inpt physical rehab. Pt d/c and agreed to make a arrangements.  Significant Events: 7/14 Admit 7/15 To ICU, VDRF 7/16 self extubated - emergent re-intubation; near arrest from left lung mucus plugging, emergent bronch for atelectasis 7/20 EEG - no acute findings     Procedures: 09/08/15 EEG IMPRESSION:  This EEG demonstrated no focal, hemispheric, or lateralizing features.  No epileptiform activity was recorded.  There was an excessive amount of fast (beta) activity noted throughout the recording, which is often due to medication effect.  This is usually due to medication such as benzodiazepines.  Correlate clinically.   (i.e. Studies not automatically included, echos, thoracentesis, etc; not x-rays)  Consultations:  PMR, Pulm/CCM, Neuro, Psych  Discharge Exam: Vitals:   09/13/15 0929 09/13/15 1334  BP: 121/61 (!) 104/58  Pulse: (!) 107 (!) 110  Resp: 18 20  Temp: 98.1 F (36.7 C) 98.5 F (36.9 C)     General:  No diaphoresis, anxious, no acute distress  Cardiovascular:tachy rate and reg rhythm no murmurs rubs or gallops  Respiratory: Clear to auscultation bilaterally no more breathing  Abdomen: Nondistended bowel sounds normal nontender palpation  Musculoskeletal: Moving all extremities, no deformity, 4 out of  5 strength (effort?)   Discharge Instructions    Current Discharge Medication List    CONTINUE these medications which have NOT CHANGED   Details  Buprenorphine HCl-Naloxone HCl (SUBOXONE) 8-2 MG FILM Place 2.5 Film under the tongue every morning. Dissolve 2 & 1/2 films under tongue every morning.    clonazePAM  (KLONOPIN) 0.5 MG tablet Take 1 tablet (0.5 mg total) by mouth 2 (two) times daily. Qty: 10 tablet, Refills: 0    methylphenidate (RITALIN) 20 MG tablet Take 60 mg by mouth daily.       Allergies  Allergen Reactions  . Compazine [Prochlorperazine Edisylate] Other (See Comments)    Blood pressure drops  . Nsaids Other (See Comments)    unknown  . Phenergan [Promethazine Hcl] Other (See Comments)    Blood pressure drops   . Tramadol Other (See Comments)    Blood pressure drops       The results of significant diagnostics from this hospitalization (including imaging, microbiology, ancillary and laboratory) are listed below for reference.    Significant Diagnostic Studies: Dg Chest 1 View  Result Date: 08/29/2015 CLINICAL DATA:  Not responding after vomiting today. Clinical concern for aspiration. Smoker. EXAM: CHEST 1 VIEW COMPARISON:  01/20/2015. FINDINGS: Normal sized heart.  Clear lungs.  Normal appearing bones. IMPRESSION: No acute abnormality. Electronically Signed   By: Beckie Salts M.D.   On: 08/29/2015 16:20   Ct Head Wo Contrast  Result Date: 09/03/2015 CLINICAL DATA:  35 year old male with altered mental status EXAM: CT HEAD WITHOUT CONTRAST TECHNIQUE: Contiguous axial images were obtained from the base of the skull through the vertex without intravenous contrast. COMPARISON:  None. FINDINGS: The ventricles and the sulci are appropriate in size for the patient's age. There is no intracranial hemorrhage. No midline shift or mass effect identified. The gray-white matter differentiation is preserved. The visualized paranasal sinuses and mastoid air cells are well aerated. The calvarium is intact. IMPRESSION: No acute intracranial pathology. Electronically Signed   By: [name] M.D.   On: 09/03/2015 01:19   Ct Angio Chest Pe W Or Wo Contrast  Result Date: 09/12/2015 CLINICAL DATA:  Tachycardia. Respiratory failure. Sepsis and left lower lobe aspiration earlier during this  hospital admission. EXAM: CT ANGIOGRAPHY CHEST WITH CONTRAST TECHNIQUE: Multidetector CT imaging of the chest was performed using the standard protocol during bolus administration of intravenous contrast. Multiplanar CT image reconstructions and MIPs were obtained to evaluate the vascular anatomy. CONTRAST:  100 mL Isovue 370 COMPARISON:  Chest radiograph 09/09/2015 FINDINGS: Mediastinum/Lymph Nodes: No pulmonary emboli or thoracic aortic dissection identified. No masses or pathologically enlarged lymph nodes identified. Lungs/Pleura: The major airways are patent. A small amount of mucus is noted in the upper trachea. Evaluation of the lung parenchyma is lot mildly limited by motion artifact. There is likely faint scratched of there is faint scratched of there small areas of faint ground-glass opacity scattered throughout the left lower lobe with likely mild centrilobular nodularity. There is minimal dependent atelectasis in the lung bases. A 3 mm nodule in the left lower lobe is likely benign given patient's age and no history of prior malignancy. There is no pleural effusion. Upper abdomen: No acute findings. Musculoskeletal: There are mild superior endplate compression deformities involving the T1, T2, T3, T4, T5, and T9 vertebral bodies. Review of the MIP images confirms the above findings. IMPRESSION: 1. No evidence of pulmonary emboli. 2. Minimal left lower lobe ground-glass nodularity, likely resolving changes of patient's clinical aspiration and  left lower lobe collapse earlier this admission. 3. Multiple mild thoracic vertebral superior endplate fractures, age indeterminate. Electronically Signed   By: Sebastian Ache M.D.   On: 09/12/2015 17:17  Dg Chest Port 1 View  Result Date: 09/09/2015 CLINICAL DATA:  Aspiration into airway. EXAM: PORTABLE CHEST 1 VIEW COMPARISON:  Radiograph of September 07, 2015. FINDINGS: The heart size and mediastinal contours are within normal limits. Both lungs are clear. No  pneumothorax or pleural effusion is noted. The visualized skeletal structures are unremarkable. IMPRESSION: No acute cardiopulmonary abnormality seen. Electronically Signed   By: Lupita Raider, M.D.   On: 09/09/2015 13:12   Dg Chest Port 1 View  Result Date: 09/07/2015 CLINICAL DATA:  Respiratory failure. EXAM: PORTABLE CHEST 1 VIEW COMPARISON:  09/06/2015. FINDINGS: Endotracheal tube, left IJ line, NG tube in stable position. Heart size normal. Improvement of left lower lobe atelectasis. Persistent left lower lobe infiltrate consistent with pneumonia. No pleural effusion or pneumothorax. IMPRESSION: 1. Lines and tubes in stable position. 2. Interim improvement of left base atelectasis. Persistent left lower lobe infiltrate consistent pneumonia. Electronically Signed   By: Maisie Fus  Register   On: 09/07/2015 07:41   Dg Chest Port 1 View  Result Date: 09/06/2015 CLINICAL DATA:  Pneumonia, respiratory failure intubated patient, polysubstance abuse EXAM: PORTABLE CHEST 1 VIEW COMPARISON:  Portable chest x-ray of September 05, 2015 FINDINGS: The lungs are well-expanded. There is persistent left basilar density. The heart is normal in size. The pulmonary vascularity is not engorged. The endotracheal tube tip lies 3.5 cm above the carina. The esophagogastric tube tip projects below the inferior margin of the image. The left internal jugular venous catheter tip projects over the midportion of the SVC. IMPRESSION: Persistent left lower lobe atelectasis or pneumonia. Trace left pleural effusion. The support tubes are in reasonable position. Electronically Signed   By: David  [place] M.D.   On: 09/06/2015 07:28   Dg Chest Port 1 View  Result Date: 09/05/2015 CLINICAL DATA:  Respiratory failure EXAM: PORTABLE CHEST 1 VIEW COMPARISON:  09/04/2015 FINDINGS: The support apparatus is in good position and stable. The endotracheal tube is 3 cm above the carina. External pacer paddles are still in place. The cardiac silhouette,  mediastinal and hilar contours are normal and unchanged. Persistent left basilar atelectasis and small left effusion. IMPRESSION: Support apparatus in good position, unchanged. Persistent left basilar atelectasis and small left effusion. Electronically Signed   By: Rudie Meyer M.D.   On: 09/05/2015 07:12   Dg Chest Port 1 View  Result Date: 09/04/2015 CLINICAL DATA:  Initial evaluation for line placement. EXAM: PORTABLE CHEST 1 VIEW COMPARISON:  Prior radiograph from 09/03/2009. FINDINGS: Patient is intubated with the tip of the endotracheal to positioned 5 cm above the carina. Interval placement of a left IJ approach central venous catheter with tip overlying the cavoatrial junction. Enteric tube courses in of the abdomen. Side hole overlies a stomach. Tip nonvisualized. Defibrillator pad overlies the chest. Heart size stable. Improved aeration of the left lung with residual left perihilar and basilar opacities, at least in part atelectasis, although superimposed aspiration or infection may be present. Right lung clear. No pneumothorax. Osseous structures unchanged. IMPRESSION: 1. Left IJ approach central venous catheter tip overlying the cavoatrial junction. 2. Endotracheal tube 5 cm above the carina. Enteric tube overlying the stomach. 3. Improved aeration of the left lung with residual left basilar opacities. Findings at least in part reflect atelectasis, although superimposed infection or aspiration may be present. Electronically  Signed   By: Rise Mu M.D.   On: 09/04/2015 22:57   Dg Chest Port 1 View  Result Date: 09/04/2015 CLINICAL DATA:  Intubation. EXAM: PORTABLE CHEST 1 VIEW COMPARISON:  09/04/2015 FINDINGS: Interval intubation with endotracheal tube just below the clavicular heads. There is new extensive opacification and volume loss on the left, with mediastinal shift and diaphragm elevation. Hyperinflated right lung which is comparatively clear. No pneumothorax is seen. Grossly  normal heart size. Pending follow-up chest x-ray. IMPRESSION: 1. New endotracheal tube in good position. 2. Extensive left-sided atelectasis with lower lobe collapse. Question interval aspiration. Electronically Signed   By: Marnee Spring M.D.   On: 09/04/2015 21:35   Dg Chest Port 1 View  Result Date: 09/04/2015 CLINICAL DATA:  Self extubation with breathing difficulty. EXAM: PORTABLE CHEST 1 VIEW COMPARISON:  09/03/2015 FINDINGS: 0650 hours. Endotracheal tube and NG tube no longer visible. Interval increase in left base collapse/ consolidation. Right lung clear. The cardiopericardial silhouette is within normal limits for size. The visualized bony structures of the thorax are intact. Telemetry leads overlie the chest. IMPRESSION: Worsening left base collapse/ consolidation. Electronically Signed   By: Kennith Center M.D.   On: 09/04/2015 09:19   Dg Chest Port 1 View  Result Date: 09/03/2015 CLINICAL DATA:  35 year old male with respiratory distress and hypoxia requiring intubation. EXAM: PORTABLE CHEST 1 VIEW COMPARISON:  09/03/2015 and prior exams. FINDINGS: An endotracheal tube is identified with tip 5.6 cm above the carina. An NG tube is noted with tip overlying the proximal stomach. Bibasilar opacities are present. There is no evidence of pneumothorax. IMPRESSION: Endotracheal tube and NG tube placement as described. Bibasilar opacities -question atelectasis versus airspace disease. Electronically Signed   By: Harmon Pier M.D.   On: 09/03/2015 18:26   Dg Chest Port 1 View  Result Date: 09/03/2015 CLINICAL DATA:  Acute aspiration EXAM: PORTABLE CHEST 1 VIEW COMPARISON:  09/03/2015 FINDINGS: Portable exam rotated to the right. Normal heart size and vascularity. Slight increased bibasilar streaky opacities medially, suspect atelectasis. Negative for edema, effusion or pneumothorax. Trachea midline. Monitor leads overlie the chest. IMPRESSION: Mild increased bibasilar atelectasis compared to earlier  today. Electronically Signed   By: Judie Petit.  Shick M.D.   On: 09/03/2015 12:25   Dg Chest Port 1 View  Result Date: 09/03/2015 CLINICAL DATA:  Shortness of breath. EXAM: PORTABLE CHEST 1 VIEW COMPARISON:  Radiograph of August 29, 2015. FINDINGS: The heart size and mediastinal contours are within normal limits. Both lungs are clear. No pneumothorax or pleural effusion is noted. The visualized skeletal structures are unremarkable. IMPRESSION: No acute cardiopulmonary abnormality seen. Electronically Signed   By: Lupita Raider, M.D.   On: 09/03/2015 07:56    Microbiology: Recent Results (from the past 240 hour(s))  Culture, respiratory (NON-Expectorated)     Status: None   Collection Time: 09/03/15  8:00 PM  Result Value Ref Range Status   Specimen Description ENDOTRACHEAL  Final   Special Requests NONE  Final   Gram Stain   Final    ABUNDANT WBC PRESENT, PREDOMINANTLY PMN RARE SQUAMOUS EPITHELIAL CELLS PRESENT MODERATE GRAM VARIABLE ROD FEW GRAM POSITIVE COCCI IN PAIRS    Culture Consistent with normal respiratory flora.  Final   Report Status 09/05/2015 FINAL  Final  Culture, blood (Routine X 2) w Reflex to ID Panel     Status: None   Collection Time: 09/06/15  2:41 AM  Result Value Ref Range Status   Specimen Description BLOOD RIGHT  ANTECUBITAL  Final   Special Requests BOTTLES DRAWN AEROBIC AND ANAEROBIC 5CC  Final   Culture NO GROWTH 5 DAYS  Final   Report Status 09/11/2015 FINAL  Final  Culture, blood (Routine X 2) w Reflex to ID Panel     Status: None   Collection Time: 09/06/15  2:44 AM  Result Value Ref Range Status   Specimen Description BLOOD RIGHT ANTECUBITAL  Final   Special Requests BOTTLES DRAWN AEROBIC AND ANAEROBIC 5CC  Final   Culture NO GROWTH 5 DAYS  Final   Report Status 09/11/2015 FINAL  Final  Culture, respiratory (NON-Expectorated)     Status: None   Collection Time: 09/06/15  3:26 AM  Result Value Ref Range Status   Specimen Description TRACHEAL ASPIRATE  Final    Special Requests Normal  Final   Gram Stain   Final    MODERATE WBC PRESENT,BOTH PMN AND MONONUCLEAR RARE SQUAMOUS EPITHELIAL CELLS PRESENT NO ORGANISMS SEEN    Culture FEW CANDIDA ALBICANS  Final   Report Status 09/10/2015 FINAL  Final     Labs: Basic Metabolic Panel:  Recent Labs Lab 09/07/15 0422  09/08/15 0745  09/09/15 0929 09/10/15 0718 09/11/15 0435 09/12/15 0529 09/13/15 0259  NA 139  < > 140  < > 140 141 139 137 135  K 3.9  < > 3.4*  < > 3.7 3.5 3.5 3.7 3.8  CL 104  < > 103  < > 104 102 102 98* 101  CO2 28  < > 25  < > 26 27 28 31 26   GLUCOSE 110*  < > 118*  < > 102* 88 111* 98 106*  BUN 10  < > 7  < > <5* 9 14 12 13   CREATININE 0.43*  < > 0.75  < > 0.59* 0.71 0.69 0.72 0.62  CALCIUM 8.1*  < > 8.9  < > 9.4 9.7 9.8 9.8 9.0  MG 1.8  --  2.0  --   --   --  2.1 2.1 2.0  PHOS 2.6  --   --   --   --   --  4.9* 4.3 3.5  < > = values in this interval not displayed. Liver Function Tests:  Recent Labs Lab 09/10/15 0718  AST 22  ALT 32  ALKPHOS 68  BILITOT 0.8  PROT 7.3  ALBUMIN 3.3*   No results for input(s): LIPASE, AMYLASE in the last 168 hours.  Recent Labs Lab 09/09/15 1035  AMMONIA 36*   CBC:  Recent Labs Lab 09/09/15 0929 09/10/15 0718 09/11/15 0435 09/12/15 0529 09/13/15 0259  WBC 9.9 9.1 11.0* 9.6 10.2  NEUTROABS  --   --   --  5.7 6.5  HGB 12.4* 13.4 13.4 13.4 12.7*  HCT 37.9* 40.4 41.1 41.1 39.2  MCV 90.9 91.2 91.7 91.9 91.6  PLT 378 355 445* 436* 419*   Cardiac Enzymes: No results for input(s): CKTOTAL, CKMB, CKMBINDEX, TROPONINI in the last 168 hours. BNP: BNP (last 3 results) No results for input(s): BNP in the last 8760 hours.  ProBNP (last 3 results) No results for input(s): PROBNP in the last 8760 hours.  CBG:  Recent Labs Lab 09/08/15 0330 09/08/15 0821 09/08/15 1135 09/08/15 1551 09/08/15 1920  GLUCAP 94 116* 105* 103* 128*       Signed:  Haydee Salter MD  FACP  [facility] 09/13/2015, 4:15  PM

## 2015-09-13 NOTE — Progress Notes (Signed)
Occupational Therapy Treatment Patient Details Name: TJ KITCHINGS MRN: [id number] DOB: [birth year] Today's Date: 09/13/2015    History of present illness 35 yo with VDRF 7/15 from aspiration setting of drug withdrawal, altered mental status. Self extubated 7/16>> emergent re-intubation; near arrest from left lung mucus plugging, emergent bronch for atelectasis.   OT comments  Pt now with d/c plans for home. Pt demonstrates tub transfer with seat which patient reports having at home. No family present but previous session mother reports pt must be min guard to Mod I for her to (A). Pt demonstrated this level today.  Pt does have some dizziness with transfers. Pt reports pain in lower back     Follow Up Recommendations  Home health OT    Equipment Recommendations  None recommended by OT    Recommendations for Other Services      Precautions / Restrictions Precautions Precautions: Fall       Mobility Bed Mobility               General bed mobility comments: with PT Ashly in the hall   Transfers Overall transfer level: Needs assistance   Transfers: Sit to/from Stand Sit to Stand: Min guard              Balance           Standing balance support: No upper extremity supported;During functional activity Standing balance-Leahy Scale: Fair                     ADL Overall ADL's : Needs assistance/impaired                                 Tub/ Shower Transfer: Min guard;Shower Field seismologist Details (indicate cue type and reason): pt reports having an older tub seat at home and with good sequence and demo    General ADL Comments: pt will have mother (A) upon d/c home. Pt was able to walk to the gym min guard (A) and into the tub transfer.       Vision                     Perception     Praxis      Cognition   Behavior During Therapy: Flat affect Overall Cognitive Status: Within Functional Limits for tasks  assessed                       Extremity/Trunk Assessment               Exercises     Shoulder Instructions       General Comments      Pertinent Vitals/ Pain          Home Living                                          Prior Functioning/Environment              Frequency Min 2X/week     Progress Toward Goals  OT Goals(current goals can now be found in the care plan section)  Progress towards OT goals: Progressing toward goals  Acute Rehab OT Goals Patient Stated Goal: to go to rehab OT Goal Formulation: With patient Time For Goal Achievement: 09/26/15 Potential to  Achieve Goals: Good ADL Goals Pt Will Perform Upper Body Bathing: with min guard assist;sitting Additional ADL Goal #1: Pt will complete DGI with score 19 or greater to indicate decr fall risk during adls Additional ADL Goal #2: Pt will complete 3 step adl task with supervision and no cues   Plan Discharge plan needs to be updated    Co-evaluation                 End of Session Equipment Utilized During Treatment: Gait belt   Activity Tolerance Patient tolerated treatment well   Patient Left in chair;with call bell/phone within reach;with chair alarm set   Nurse Communication Mobility status;Precautions        Time: 0230-0238 OT Time Calculation (min): 8 min  Charges: OT General Charges $OT Visit: 1 Procedure OT Treatments $Self Care/Home Management : 8-22 mins  Boone Master B 09/13/2015, 3:12 PM   Mateo Flow   OTR/L Pager: [phone] Office: [phone] .

## 2015-09-13 NOTE — Progress Notes (Addendum)
1:15pm Pt Medicaid will not pay for SNF stay- pt doing to well physically to qualify for LOG placement  CSW discussed with pt who believes he will be fine at home with some equipment- RNCM informed  CSW unable to place patient into inpatient rehab program from the hospital- pt has to make contact with the facilities and do phone or in person assessment- pt has been to rehab in the past and is agreeable to this plan states he will begin making appointments  Pt also states that he has no concerns about home situation and that things just got a little out of hand and he is ready to start over.  No further CSW needs at this time- signing off  9:30am CSW received phone call from patients outpatient psychiatrist/addictionologist, Dr. Doreene Eland (phone: [phone], Fax: [phone]), she states she has been treating this patient for 5 years and feels as if he is a danger to himself due to intensity of drugs he is using (ordering fentanyl, benzos from [place])  Dr. Doreene Eland feels as if patient will not do well at home due and thinks that physical therapy rehab or long term inpatient drug rehab programs are more appropriate for this patient.    [name], LCSWA Clinical Social Worker [phone]

## 2015-09-13 NOTE — Progress Notes (Signed)
Plan is for patient to d/c home and follow up with inpatient drug rehab. CM met with the patient to go over the DME recommendations from PT to see what patient needs at home. Pt states he does not need a bed but he needs a wheelchair, walker and 3 in 1. CM spoke with Dr Aggie Moats and DME to be ordered. CM called Jermaine with [facility] DME and he will deliver the equipment to the room. Will update the bedside RN.

## 2015-09-13 NOTE — Progress Notes (Signed)
Physical Therapy Treatment Patient Details Name: CHANDLAR GUICE MRN: [id number] DOB: 1980/09/05 Today's Date: 09/13/2015    History of Present Illness 35 yo with VDRF 7/15 from aspiration setting of drug withdrawal, altered mental status. Self extubated 7/16>> emergent re-intubation; near arrest from left lung mucus plugging, emergent bronch for atelectasis.    PT Comments    Pt with improved ambulation tolerance this date but remains to have delayed sequencing and at increased falls risk as noted by multiple LOB with ambulation without AD. Con't to recommend CIR to achieve safe mod I level of function as pt lives with mother who can not provide physical assist.  Follow Up Recommendations  CIR;Supervision/Assistance - 24 hour     Equipment Recommendations  Rolling walker with 5" wheels;3in1 (PT);Wheelchair (measurements PT);Wheelchair cushion (measurements PT);Hospital bed    Recommendations for Other Services Rehab consult     Precautions / Restrictions Precautions Precautions: Fall Restrictions Weight Bearing Restrictions: No    Mobility  Bed Mobility Overal bed mobility: Needs Assistance Bed Mobility: Supine to Sit     Supine to sit: Min guard     General bed mobility comments: increased time, HOB elevated  Transfers Overall transfer level: Needs assistance Equipment used: Rolling walker (2 wheeled) Transfers: Sit to/from Stand Sit to Stand: Min guard         General transfer comment: v/c's for safe hand placement however pt con't to pull up on walker  Ambulation/Gait Ambulation/Gait assistance: Min assist Ambulation Distance (Feet): 200 Feet Assistive device: Rolling walker (2 wheeled);None Gait Pattern/deviations: Step-through pattern;Wide base of support Gait velocity: impulsively fast   General Gait Details: pt initially amb with RW and required v/c's to decrease speed as pt impulsively fast. pt then amb without AD and had 3 episodes of LOB  requiring minA to regain balance   Stairs Stairs: Yes Stairs assistance: Min guard Stair Management: Two rails;Alternating pattern Number of Stairs: 2 General stair comments: increased time  Wheelchair Mobility    Modified Rankin (Stroke Patients Only)       Balance Overall balance assessment: Needs assistance Sitting-balance support: Feet supported;No upper extremity supported Sitting balance-Leahy Scale: Fair     Standing balance support: No upper extremity supported Standing balance-Leahy Scale: Fair                      Cognition Arousal/Alertness: Awake/alert Behavior During Therapy: Flat affect Overall Cognitive Status: Within Functional Limits for tasks assessed                      Exercises      General Comments        Pertinent Vitals/Pain Pain Assessment: No/denies pain    Home Living                      Prior Function            PT Goals (current goals can now be found in the care plan section) Acute Rehab PT Goals Patient Stated Goal: to go to rehab Progress towards PT goals: Progressing toward goals    Frequency  Min 3X/week    PT Plan Current plan remains appropriate    Co-evaluation             End of Session Equipment Utilized During Treatment: Gait belt Activity Tolerance: Patient tolerated treatment well Patient left: in chair;with call bell/phone within reach;with chair alarm set     Time: 1415-1443 PT Time  Calculation (min) (ACUTE ONLY): 28 min  Charges:  $Gait Training: 8-22 mins                    G Codes:      Marcene Brawn 09/13/2015, 4:02 PM  Lewis Shock, PT, DPT Pager #: [phone] Office #: [phone]

## 2017-04-12 ENCOUNTER — Ambulatory Visit (HOSPITAL_COMMUNITY)
Admission: EM | Admit: 2017-04-12 | Discharge: 2017-04-12 | Disposition: A | Discharge status: Home / Self Care | Payer: Medicaid Other | Attending: Family Medicine | Admitting: Family Medicine

## 2017-04-12 ENCOUNTER — Other Ambulatory Visit: Payer: Self-pay

## 2017-04-12 ENCOUNTER — Ambulatory Visit (INDEPENDENT_AMBULATORY_CARE_PROVIDER_SITE_OTHER): Payer: Medicaid Other

## 2017-04-12 ENCOUNTER — Encounter (HOSPITAL_COMMUNITY): Payer: Self-pay | Admitting: Emergency Medicine

## 2017-04-12 DIAGNOSIS — Z886 Allergy status to analgesic agent status: Secondary | ICD-10-CM | POA: Diagnosis not present

## 2017-04-12 DIAGNOSIS — F419 Anxiety disorder, unspecified: Secondary | ICD-10-CM | POA: Diagnosis not present

## 2017-04-12 DIAGNOSIS — Z888 Allergy status to other drugs, medicaments and biological substances status: Secondary | ICD-10-CM | POA: Diagnosis not present

## 2017-04-12 DIAGNOSIS — R0789 Other chest pain: Secondary | ICD-10-CM | POA: Diagnosis not present

## 2017-04-12 DIAGNOSIS — F17210 Nicotine dependence, cigarettes, uncomplicated: Secondary | ICD-10-CM | POA: Diagnosis not present

## 2017-04-12 DIAGNOSIS — K589 Irritable bowel syndrome without diarrhea: Secondary | ICD-10-CM | POA: Diagnosis not present

## 2017-04-12 DIAGNOSIS — J181 Lobar pneumonia, unspecified organism: Secondary | ICD-10-CM | POA: Diagnosis not present

## 2017-04-12 DIAGNOSIS — J029 Acute pharyngitis, unspecified: Secondary | ICD-10-CM | POA: Diagnosis present

## 2017-04-12 DIAGNOSIS — R079 Chest pain, unspecified: Secondary | ICD-10-CM | POA: Diagnosis present

## 2017-04-12 DIAGNOSIS — J189 Pneumonia, unspecified organism: Secondary | ICD-10-CM | POA: Diagnosis not present

## 2017-04-12 DIAGNOSIS — Z79899 Other long term (current) drug therapy: Secondary | ICD-10-CM | POA: Insufficient documentation

## 2017-04-12 DIAGNOSIS — F329 Major depressive disorder, single episode, unspecified: Secondary | ICD-10-CM | POA: Insufficient documentation

## 2017-04-12 LAB — POCT RAPID STREP A: STREPTOCOCCUS, GROUP A SCREEN (DIRECT): NEGATIVE

## 2017-04-12 MED ORDER — AZITHROMYCIN 250 MG PO TABS: 250.0000 mg | ORAL_TABLET | Freq: Every day | ORAL | 0 refills | Status: DC

## 2017-04-12 MED ORDER — BENZONATATE 100 MG PO CAPS: 100.0000 mg | ORAL_CAPSULE | Freq: Three times a day (TID) | ORAL | 0 refills | Status: DC | PRN

## 2017-04-12 NOTE — ED Provider Notes (Addendum)
[facility]   [id number] 04/12/17 Arrival Time: 1422   SUBJECTIVE:  [name] is a 37 y.o. male who presents to the urgent care with complaint of sore throat two days ago.  Since then he has had some mild nasal congestion and a cough.  He is here today because he has been having left chest pain with inspiration that radiates to his back.  Pt was hospitalized with a collapsed left lung and pneumonia two years ago.  Some mild nausea but no abdominal pain. No diarrhea. No vomiting.  Past Medical History:  Diagnosis Date  . Anxiety   . Benzodiazepine abuse (HCC)   . Chronic tachycardia    Per Dr. Doreene ElandHendrix (psych)  . Depression   . Drug use   . History of IBS   . Polysubstance abuse (HCC)    History reviewed. No pertinent family history. Social History   Socioeconomic History  . Marital status: Single    Spouse name: Not on file  . Number of children: Not on file  . Years of education: Not on file  . Highest education level: Not on file  Social Needs  . Financial resource strain: Not on file  . Food insecurity - worry: Not on file  . Food insecurity - inability: Not on file  . Transportation needs - medical: Not on file  . Transportation needs - non-medical: Not on file  Occupational History  . Not on file  Tobacco Use  . Smoking status: Current Every Day Smoker    Packs/day: 0.50    Types: Cigarettes  . Smokeless tobacco: Never Used  Substance and Sexual Activity  . Alcohol use: No  . Drug use: No  . Sexual activity: Not on file  Other Topics Concern  . Not on file  Social History Narrative   n/a   Current Meds  Medication Sig  . Buprenorphine HCl-Naloxone HCl (SUBOXONE) 8-2 MG FILM Place 1 Film under the tongue at bedtime.  . fluvoxaMINE (LUVOX) 100 MG tablet Take 200 mg by mouth at bedtime.   Allergies  Allergen Reactions  . Compazine [Prochlorperazine Edisylate] Other (See Comments)    Blood pressure drops  . Nsaids Other (See Comments)      unknown  . Phenergan [Promethazine Hcl] Other (See Comments)    Blood pressure drops   . Tramadol Other (See Comments)    Blood pressure drops       ROS: As per HPI, remainder of ROS negative.   OBJECTIVE:   Vitals:   04/12/17 1541  BP: 109/82  Pulse: 93  Temp: 98.5 F (36.9 C)  TempSrc: Oral  SpO2: 99%     General appearance: alert; no distress Eyes: PERRL; EOMI; conjunctiva normal HENT: normocephalic; atraumatic; TMs normal, canal normal, external ears normal without trauma; nasal mucosa normal; poor dental condition Neck: supple Lungs: clear to auscultation bilaterally Heart: regular rate and rhythm Back: no CVA tenderness Extremities: no cyanosis or edema; symmetrical with no gross deformities Skin: warm and dry Neurologic: normal gait; grossly normal Psychological: alert and cooperative; normal mood and affect      Labs:  Results for orders placed or performed during the hospital encounter of 04/12/17  POCT rapid strep A [facility]  Result Value Ref Range   Streptococcus, Group A Screen (Direct) NEGATIVE NEGATIVE    Labs Reviewed  CULTURE, GROUP A STREP [facility])  POCT RAPID STREP A    Dg Chest 2 View  Result Date: 04/12/2017 CLINICAL DATA:  Left-sided chest pain. History of pneumothorax. Cough and fatigue for 3 days. EXAM: CHEST  2 VIEW COMPARISON:  09/12/2015 FINDINGS: Indistinct lung opacity only seen on the AP view about the left mid lung. No Kerley lines, effusion, or pneumothorax. Normal heart size and mediastinal contours. IMPRESSION: Subtle lung opacity on the left, please correlate for pneumonia symptoms. Electronically Signed   By: Marnee Spring M.D.   On: 04/12/2017 16:10       ASSESSMENT & PLAN:  1. Community acquired pneumonia of left lower lobe of lung (HCC)     Meds ordered this encounter  Medications  . azithromycin (ZITHROMAX) 250 MG tablet    Sig: Take 1 tablet (250 mg total) by mouth daily. Take first 2 tablets  together, then 1 every day until finished.    Dispense:  6 tablet    Refill:  0  . benzonatate (TESSALON) 100 MG capsule    Sig: Take 1-2 capsules (100-200 mg total) by mouth 3 (three) times daily as needed for cough.    Dispense:  40 capsule    Refill:  0    Reviewed expectations re: course of current medical issues. Questions answered. Outlined signs and symptoms indicating need for more acute intervention. Patient verbalized understanding. After Visit Summary given.      Elvina Sidle, MD 04/12/17 [phone]

## 2017-04-12 NOTE — ED Triage Notes (Signed)
Pt states he developed a sore throat two days ago.  Since then he has had some mild nasal congestion and a cough.  He is here today because he has been having left chest pain with inspiration that radiates to his back.  Pt was hospitalized with a collapsed left lung and pneumonia two years ago.

## 2017-04-12 NOTE — Discharge Instructions (Signed)
Your strep test is negative by your chest x-ray does show an early pneumonia in the left chest. You need to have a follow-up chest x-ray in about a week to make sure this is clearing.

## 2017-04-15 LAB — CULTURE, GROUP A STREP (THRC)

## 2017-04-21 ENCOUNTER — Encounter (HOSPITAL_COMMUNITY): Payer: Self-pay | Admitting: *Deleted

## 2017-04-21 ENCOUNTER — Ambulatory Visit (HOSPITAL_COMMUNITY)
Admission: EM | Admit: 2017-04-21 | Discharge: 2017-04-21 | Disposition: A | Discharge status: Home / Self Care | Payer: Medicaid Other | Attending: Physician Assistant | Admitting: Physician Assistant

## 2017-04-21 ENCOUNTER — Other Ambulatory Visit: Payer: Self-pay

## 2017-04-21 ENCOUNTER — Ambulatory Visit (INDEPENDENT_AMBULATORY_CARE_PROVIDER_SITE_OTHER): Payer: Medicaid Other

## 2017-04-21 DIAGNOSIS — J189 Pneumonia, unspecified organism: Secondary | ICD-10-CM

## 2017-04-21 NOTE — Discharge Instructions (Addendum)
Your pneumonia has resolved per x-ray.

## 2017-04-21 NOTE — ED Triage Notes (Signed)
Per pt he was instructed to return within a week for a follow up, per pt he is still having chills and feels fatigue.

## 2017-04-21 NOTE — ED Provider Notes (Signed)
04/21/2017 5:41 PM   DOB: 07/28/1980 / MRN: [id number]  SUBJECTIVE:  [name] is a 37 y.o. male presenting for recheck of CAP.  He was diagnosed roughly 10 days ago and was started on azithromycin.  He tells me he feels 50% better today.  He has a long history of drug abuse.  Denies a history of asthma.   He is allergic to compazine [prochlorperazine edisylate]; nsaids; phenergan [promethazine hcl]; and tramadol.   He  has a past medical history of Anxiety, Benzodiazepine abuse (HCC), Chronic tachycardia, Depression, Drug use, History of IBS, and Polysubstance abuse (HCC).    He  reports that he has been smoking cigarettes.  He has been smoking about 0.50 packs per day. he has never used smokeless tobacco. He reports that he does not drink alcohol or use drugs. He  has no sexual activity history on file. The patient  has no past surgical history on file.  His family history is not on file.  Review of Systems  Constitutional: Negative for chills, diaphoresis and fever.  Eyes: Negative.   Respiratory: Negative for cough, hemoptysis, sputum production, shortness of breath and wheezing.   Cardiovascular: Negative for chest pain, orthopnea and leg swelling.  Gastrointestinal: Negative for abdominal pain, blood in stool, constipation, diarrhea, heartburn, melena, nausea and vomiting.  Genitourinary: Negative for dysuria, flank pain, frequency, hematuria and urgency.  Skin: Negative for rash.  Neurological: Negative for dizziness, sensory change, speech change, focal weakness and headaches.    OBJECTIVE:  BP 99/62 (BP Location: Left Arm)   Pulse 76   Temp 98.6 F (37 C) (Oral)   SpO2 96%   Wt Readings from Last 3 Encounters:  09/13/15 147 lb 14.9 oz (67.1 kg)  08/29/15 149 lb 14.6 oz (68 kg)  01/20/15 148 lb (67.1 kg)   Temp Readings from Last 3 Encounters:  04/21/17 98.6 F (37 C) (Oral)  04/12/17 98.5 F (36.9 C) (Oral)  09/13/15 98 F (36.7 C) (Oral)   BP Readings from  Last 3 Encounters:  04/21/17 99/62  04/12/17 109/82  09/13/15 104/61   Pulse Readings from Last 3 Encounters:  04/21/17 76  04/12/17 93  09/13/15 (!) 101     Physical Exam  Constitutional: He appears well-developed. He is active and cooperative.  Non-toxic appearance.  HENT:  Right Ear: Hearing, tympanic membrane, external ear and ear canal normal.  Left Ear: Hearing, tympanic membrane, external ear and ear canal normal.  Nose: Nose normal. Right sinus exhibits no maxillary sinus tenderness and no frontal sinus tenderness. Left sinus exhibits no maxillary sinus tenderness and no frontal sinus tenderness.  Mouth/Throat: Uvula is midline, oropharynx is clear and moist and mucous membranes are normal. No oropharyngeal exudate, posterior oropharyngeal edema or tonsillar abscesses.  Eyes: Conjunctivae are normal. Pupils are equal, round, and reactive to light.  Cardiovascular: Normal rate, regular rhythm, S1 normal, S2 normal, normal heart sounds, intact distal pulses and normal pulses. Exam reveals no gallop and no friction rub.  No murmur heard. Pulmonary/Chest: Effort normal. No stridor. No tachypnea. No respiratory distress. He has no wheezes. He has no rales.  Abdominal: He exhibits no distension.  Musculoskeletal: He exhibits no edema.  Lymphadenopathy:       Head (right side): No submandibular and no tonsillar adenopathy present.       Head (left side): No submandibular and no tonsillar adenopathy present.    He has no cervical adenopathy.  Neurological: He is alert.  Skin: Skin is warm  and dry. He is not diaphoretic. No pallor.  Vitals reviewed.   No results found for this or any previous visit (from the past 72 hour(s)).  Dg Chest 2 View  Result Date: 04/21/2017 CLINICAL DATA:  Chills, nonproductive cough, fatigue. Follow-up pneumonia. EXAM: CHEST  2 VIEW COMPARISON:  04/12/2017 FINDINGS: Prior left mid lung opacity has resolved. Right lung is clear. No pleural effusion or  pneumothorax. The heart is normal in size. Visualized osseous structures are within normal limits. IMPRESSION: Normal chest radiographs. Electronically Signed   By: Charline Bills M.D.   On: 04/21/2017 15:44    ASSESSMENT AND PLAN:  Orders Placed This Encounter  Procedures  . DG Chest 2 View    Standing Status:   Standing    Number of Occurrences:   1    Order Specific Question:   Reason for Exam (SYMPTOM  OR DIAGNOSIS REQUIRED)    Answer:   Recent history of CAP treated with azithro roughly ten days ago.     Community acquired pneumonia, unspecified laterality: Resolved.  Patient requesting x-rays today.  I will oblige.      The patient is advised to call or return to clinic if he does not see an improvement in symptoms, or to seek the care of the closest emergency department if he worsens with the above plan.   Deliah Boston, MHS, PA-C 04/21/2017 5:41 PM   Ofilia Neas, PA-C 04/21/17 1741

## 2017-05-13 ENCOUNTER — Encounter (HOSPITAL_COMMUNITY): Payer: Self-pay | Admitting: Emergency Medicine

## 2017-05-13 ENCOUNTER — Other Ambulatory Visit: Payer: Self-pay

## 2017-05-13 ENCOUNTER — Ambulatory Visit (HOSPITAL_COMMUNITY)
Admission: EM | Admit: 2017-05-13 | Discharge: 2017-05-13 | Disposition: A | Discharge status: Home / Self Care | Payer: Medicaid Other | Attending: Family Medicine | Admitting: Family Medicine

## 2017-05-13 DIAGNOSIS — S86002A Unspecified injury of left Achilles tendon, initial encounter: Secondary | ICD-10-CM | POA: Diagnosis not present

## 2017-05-13 MED ORDER — METHYLPREDNISOLONE 4 MG PO TBPK: ORAL_TABLET | ORAL | 0 refills | Status: DC

## 2017-05-13 NOTE — ED Provider Notes (Signed)
  [facility]    CSN: [id number] Arrival date & time: 05/13/17  1634  Musculoskeletal Exam  Patient: Jordan PilonBrian A Januszewski DOB: 08/14/1980  DOS: 05/13/2017  SUBJECTIVE:  Chief Complaint:   Chief Complaint  Patient presents with  . Leg Pain    Jordan PilonBrian A Soyars is a 37 y.o.  male for evaluation and treatment of L heel pain.   Onset:  Several hours ago, slipped on a hardwood floor that had Pledge cleaning on it Location: Achilles tendon Character:  aching and sharp  Progression of issue:  is unchanged Associated symptoms: Pain with ambulation, states he can't feel toes very well Treatment: to date has been none.   Neurovascular symptoms: no  ROS: Musculoskeletal/Extremities: +Heel pain  Past Medical History:  Diagnosis Date  . Anxiety   . Benzodiazepine abuse (HCC)   . Chronic tachycardia    Per Dr. Doreene ElandHendrix (psych)  . Depression   . Drug use   . History of IBS   . Polysubstance abuse (HCC)     Objective: VITAL SIGNS: BP 119/72 (BP Location: Right Arm)   Pulse 96   Temp 99.1 F (37.3 C) (Oral)   SpO2 98%  Constitutional: Well formed, well developed. No acute distress. Cardiovascular: Brisk cap refill Thorax & Lungs: No accessory muscle use Musculoskeletal: L heel.   Normal active range of motion: no 2/2 pain.   Normal passive range of motion: no 2/2 pain Tenderness to palpation: yes, over achilles tendon Deformity: no Ecchymosis: no Tests positive: none Tests negative: Thompson Neurologic: Normal sensory function over toes Psychiatric: Normal mood. Age appropriate judgment and insight. Alert & oriented x 3.    Assessment:  Achilles tendon injury, left, initial encounter  Plan: Medrol dosepak with reported allergy to NSAIDs, ice, Tylenol, CAM walker, stretches/exercises. F/u with pcp prn. The patient voiced understanding and agreement to the plan.    Sharlene DoryWendling, Nicholas Paul, [place] 05/13/17 1816

## 2017-05-13 NOTE — Discharge Instructions (Addendum)
Achilles Tendinitis Rehab It is normal to feel mild stretching, pulling, tightness, or discomfort as you do these exercises, but you should stop right away if you feel sudden pain or your pain gets worse.   Stretching and range of motion exercises These exercises warm up your muscles and joints and improve the movement and flexibility of your ankle. These exercises also help to relieve pain, numbness, and tingling. Exercise A: Standing wall calf stretch, knee straight  Stand with your hands against a wall. Extend your affected leg behind you and bend your front knee slightly. Keep both of your heels on the floor. Point the toes of your back foot slightly inward. Keeping your heels on the floor and your back knee straight, shift your weight toward the wall. Do not allow your back to arch. You should feel a gentle stretch in your calf. Hold this position for seconds. Repeat 2 times. Complete this stretch 3 times per week Exercise B: Standing wall calf stretch, knee bent Stand with your hands against a wall. Extend your affected leg behind you, and bend your front knee slightly. Keep both of your heels on the floor. Point the toes of your back foot slightly inward. Keeping your heels on the floor, unlock your back knee so that it is bent. You should feel a gentle stretch deep in your calf. Hold this position for 30 seconds. Repeat 2 times. Complete this stretch 3 times per week.  Strengthening exercises These exercises build strength and control of your ankle. Endurance is the ability to use your muscles for a long time, even after they get tired. Exercise C: Plantar flexion with band  Sit on the floor with your affected leg extended. You may put a pillow under your calf to give your foot more room to move. Loop a rubber exercise band or tube around the ball of your affected foot. The ball of your foot is on the walking surface, right under your toes. The band or tube should be slightly tense  when your foot is relaxed. If the band or tube slips, you can put on your shoe or put a washcloth between the band and your foot to help it stay in place. Slowly point your toes downward, pushing them away from you. Hold this position for 10 seconds. Slowly release the tension in the band or tube, controlling smoothly until your foot is back to the starting position. Repeat 2 times. Complete this exercise 3 times per week. Exercise D: Heel raise with eccentric lower  Stand on a step with the balls of your feet. The ball of your foot is on the walking surface, right under your toes. Do not put your heels on the step. For balance, rest your hands on the wall or on a railing. Rise up onto the balls of your feet. Keeping your heels up, shift all of your weight to your affected leg and pick up your other leg. Slowly lower your affected leg so your heel drops below the level of the step. Put down your foot. If told by your health care provider, build up to: 3 sets of 15 repetitions while keeping your knees straight. 3 sets of 15 repetitions while keeping your knees bent as far as told by your health care provider.  Complete this exercise 3 times per week. If this exercise is too easy, try doing it while wearing a backpack with weights in it. Balance exercises These exercises improve or maintain your balance. Balance is important in  preventing falls. Exercise E: Single leg stand Without shoes, stand near a railing or in a door frame. Hold on to the railing or door frame as needed. Stand on your affected foot. Keep your big toe down on the floor and try to keep your arch lifted. Hold this position for 10 seconds. Repeat 2 times. Complete this exercise 3 times per week. If this exercise is too easy, you can try it with your eyes closed or while standing on a pillow. Make sure you discuss any questions you have with your health care provider. Document Released: 09/06/2004 Document Revised:  10/13/2015 Document Reviewed: 10/12/2014 Elsevier Interactive Patient Education  2018 [employer]  Ice/cold pack over area for 10-15 min twice daily.  OK to take Tylenol 1000 mg (2 extra strength tabs) or 975 mg (3 regular strength tabs) every 6 hours as needed.

## 2017-05-13 NOTE — ED Triage Notes (Signed)
States fell today and "can't move toes" have pain in left heel and calf

## 2017-06-27 IMAGING — CR DG CHEST 1V PORT
2 series · 2 of 2 positions shown · non-contrast
Comparison: Prior radiograph from 09/03/2009.

CLINICAL DATA: Initial evaluation for line placement.

EXAM:
PORTABLE CHEST 1 VIEW

[AP (1 of 2)]
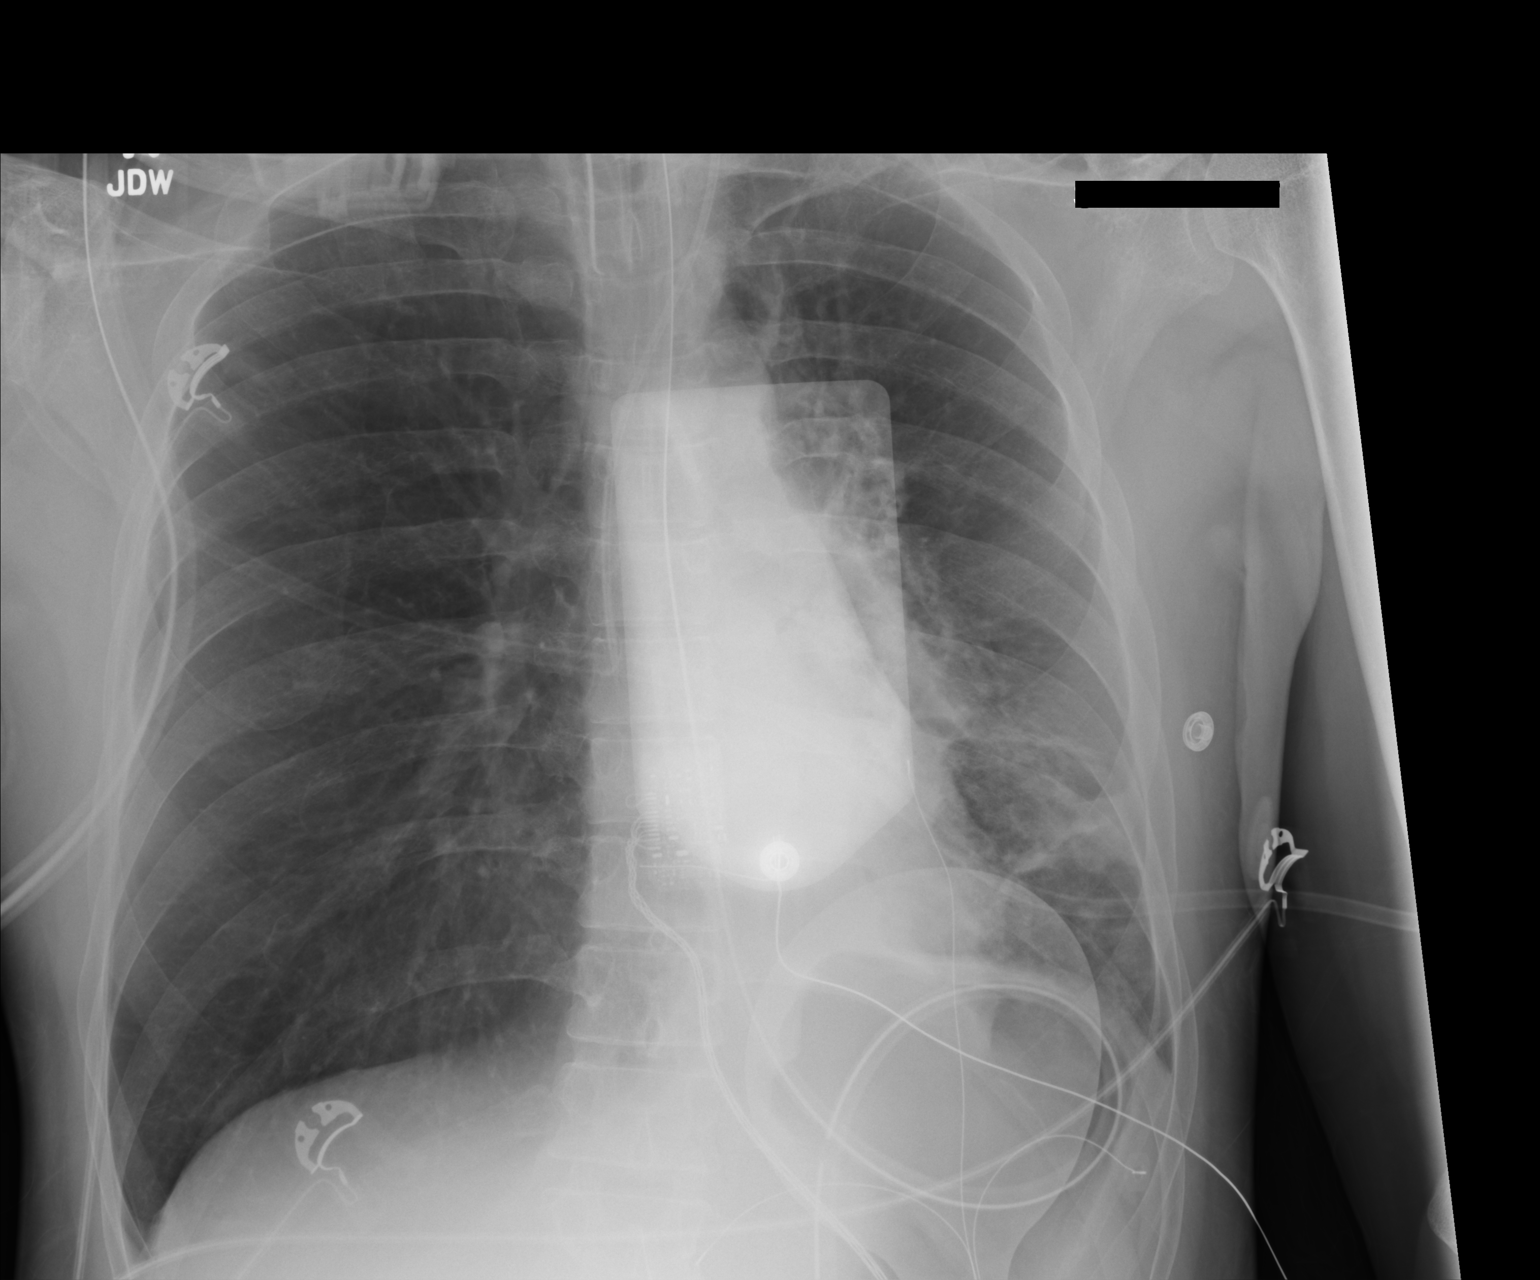

[AP (2 of 2)]
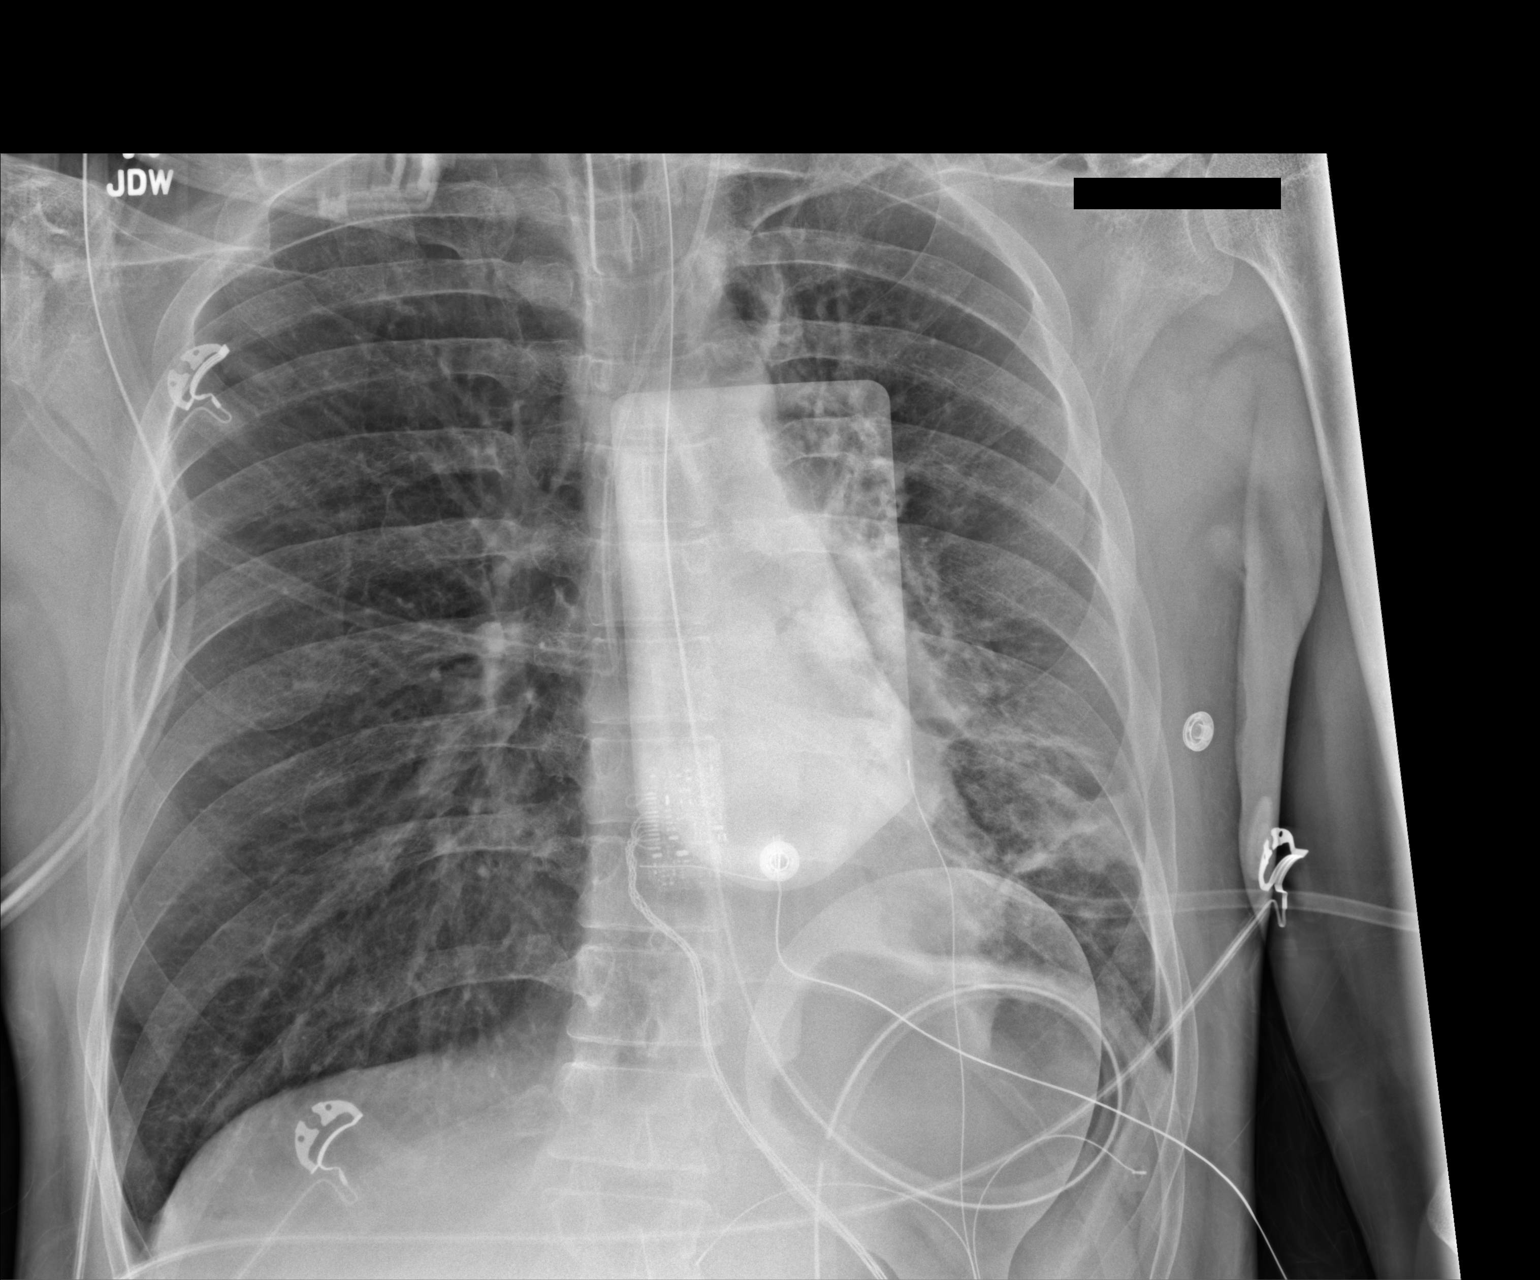

[2 of 2 positions shown; findings below may reference images not displayed]

FINDINGS: Patient is intubated with the tip of the endotracheal to positioned
5 cm above the carina. Interval placement of a left IJ approach
central venous catheter with tip overlying the cavoatrial junction.
Enteric tube courses in of the abdomen. Side hole overlies a
stomach. Tip nonvisualized. Defibrillator pad overlies the chest.
Heart size stable.

Improved aeration of the left lung with residual left perihilar and
basilar opacities, at least in part atelectasis, although
superimposed aspiration or infection may be present. Right lung
clear. No pneumothorax.

Osseous structures unchanged.
IMPRESSION: 1. Left IJ approach central venous catheter tip overlying the
cavoatrial junction.
2. Endotracheal tube 5 cm above the carina. Enteric tube overlying
the stomach.
3. Improved aeration of the left lung with residual left basilar
opacities. Findings at least in part reflect atelectasis, although
superimposed infection or aspiration may be present.

## 2017-06-27 IMAGING — CR DG CHEST 1V PORT
1 series · 1 of 1 positions shown · non-contrast
Comparison: 09/03/2015

CLINICAL DATA: Self extubation with breathing difficulty.

EXAM:
PORTABLE CHEST 1 VIEW

[AP]
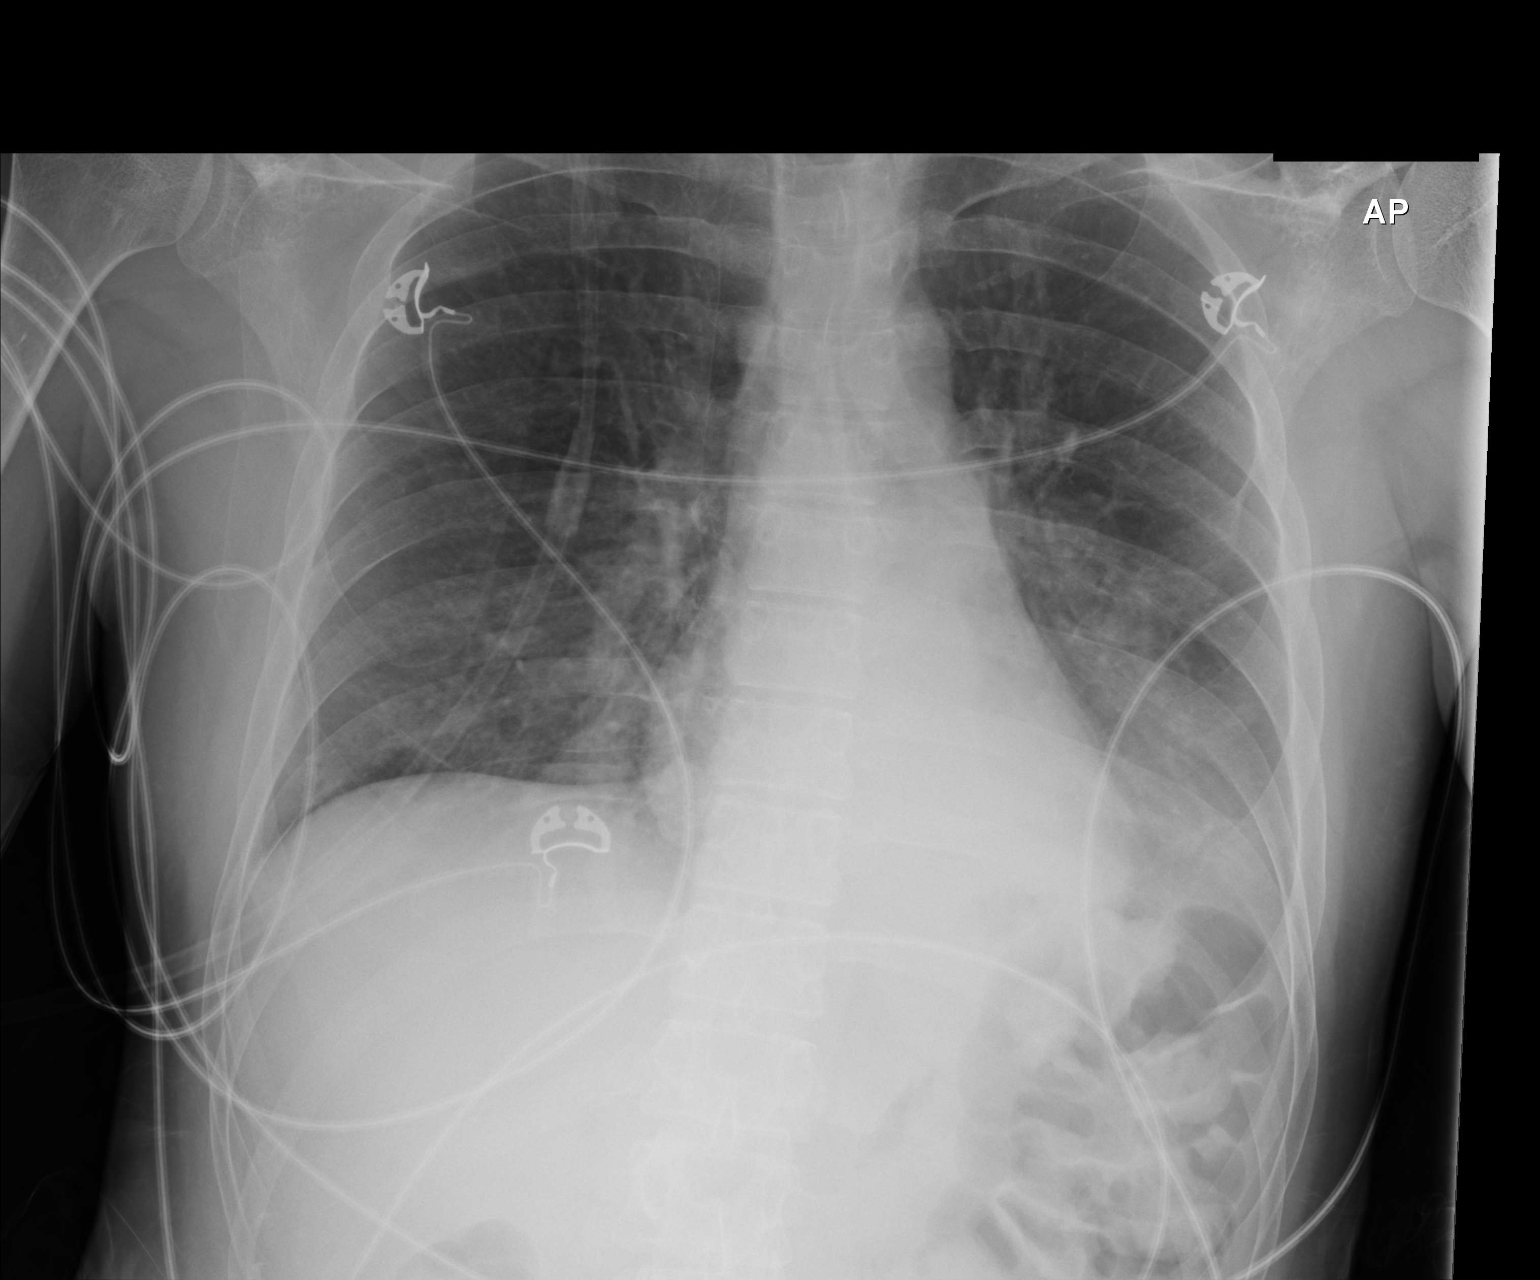

[1 of 1 positions shown; findings below may reference images not displayed]

FINDINGS: 8058 hours. Endotracheal tube and NG tube no longer visible.
Interval increase in left base collapse/ consolidation. Right lung
clear. The cardiopericardial silhouette is within normal limits for
size. The visualized bony structures of the thorax are intact.
Telemetry leads overlie the chest.
IMPRESSION: Worsening left base collapse/ consolidation.

## 2017-06-28 IMAGING — CR DG CHEST 1V PORT
1 series · 1 of 1 positions shown · non-contrast
Comparison: 09/04/2015

CLINICAL DATA: Respiratory failure

EXAM:
PORTABLE CHEST 1 VIEW

[AP]
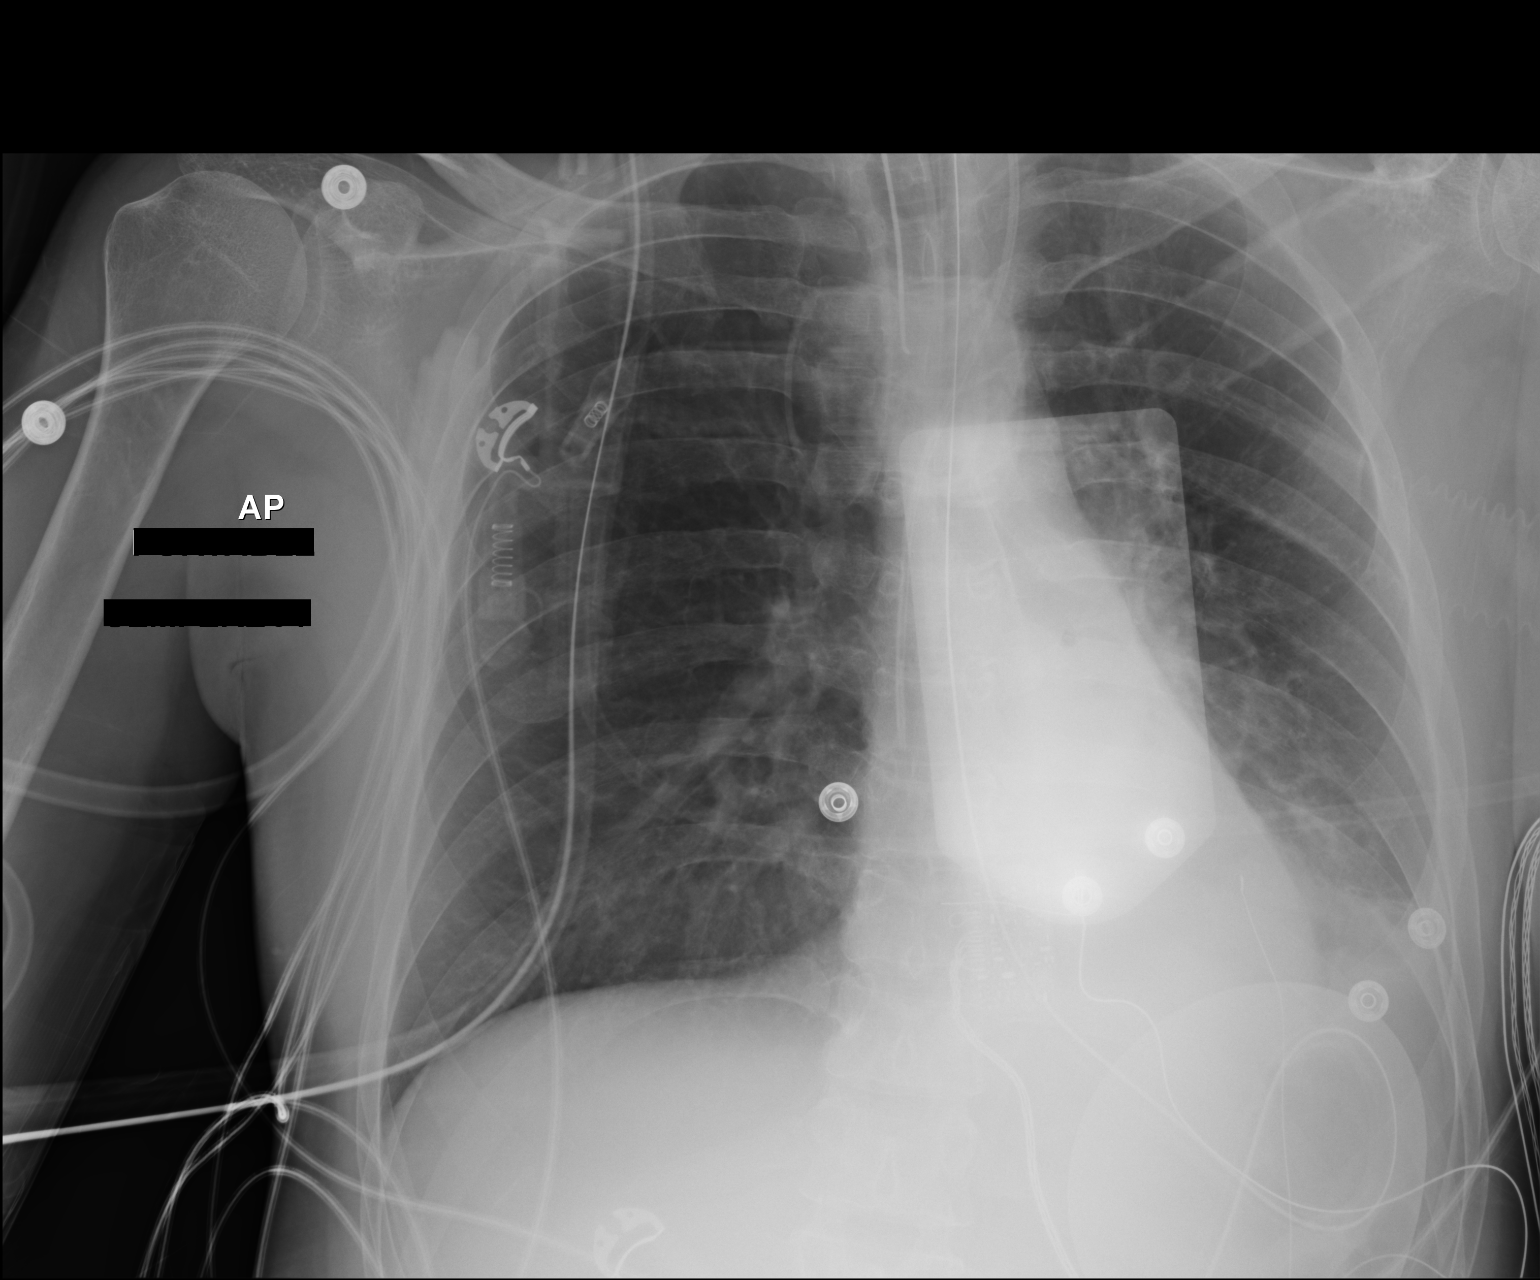

[1 of 1 positions shown; findings below may reference images not displayed]

FINDINGS: The support apparatus is in good position and stable. The
endotracheal tube is 3 cm above the carina. External pacer paddles
are still in place. The cardiac silhouette, mediastinal and hilar
contours are normal and unchanged. Persistent left basilar
atelectasis and small left effusion.
IMPRESSION: Support apparatus in good position, unchanged.

Persistent left basilar atelectasis and small left effusion.

## 2017-06-29 IMAGING — DX DG CHEST 1V PORT
1 series · 1 of 1 positions shown · non-contrast
Comparison: Portable chest x-ray September 05, 2015

CLINICAL DATA: Pneumonia, respiratory failure intubated patient,
polysubstance abuse

EXAM:
PORTABLE CHEST 1 VIEW

[chest ap]
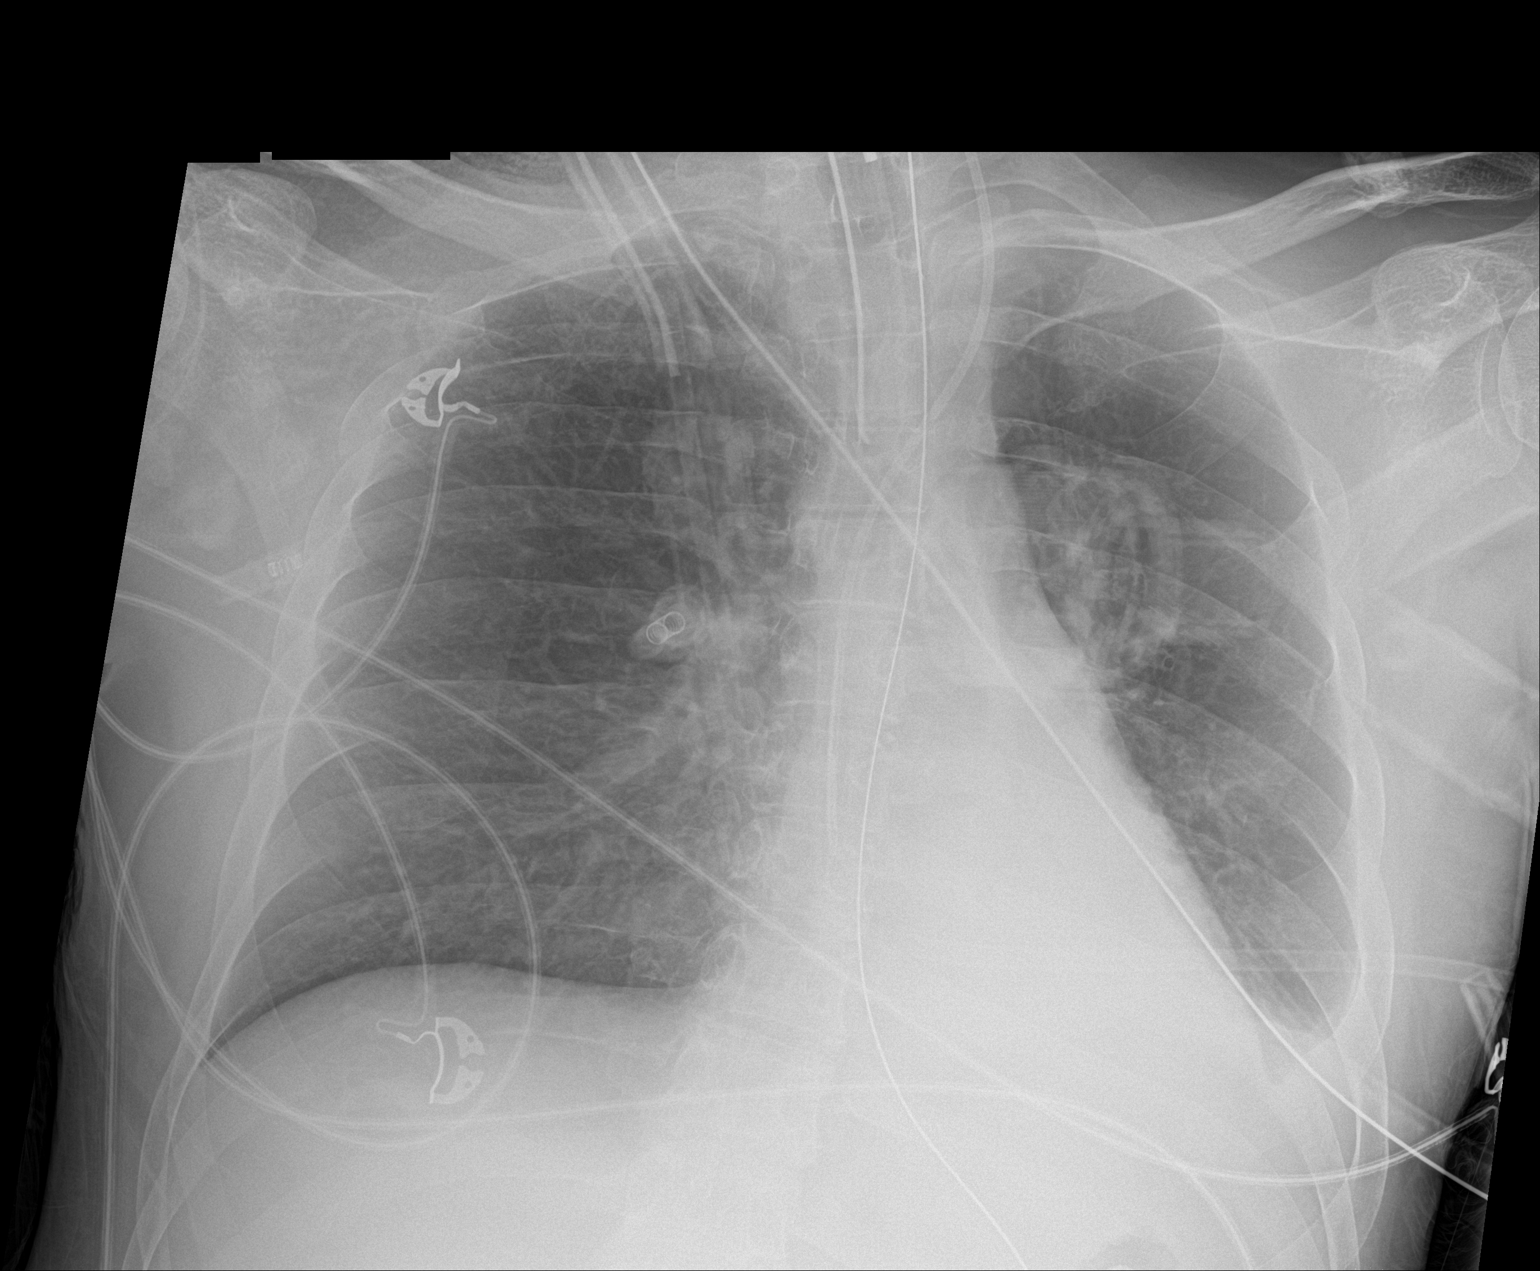

[1 of 1 positions shown; findings below may reference images not displayed]

FINDINGS: The lungs are well-expanded. There is persistent left basilar
density. The heart is normal in size. The pulmonary vascularity is
not engorged. The endotracheal tube tip lies 3.5 cm above the
carina. The esophagogastric tube tip projects below the inferior
margin of the image. The left internal jugular venous catheter tip
projects over the midportion of the SVC.
IMPRESSION: Persistent left lower lobe atelectasis or pneumonia. Trace left
pleural effusion. The support tubes are in reasonable position.

## 2017-07-02 IMAGING — DX DG CHEST 1V PORT
1 series · 1 of 1 positions shown · non-contrast
Comparison: Radiograph September 07, 2015.

CLINICAL DATA: Aspiration into airway.

EXAM:
PORTABLE CHEST 1 VIEW

[chest ap]
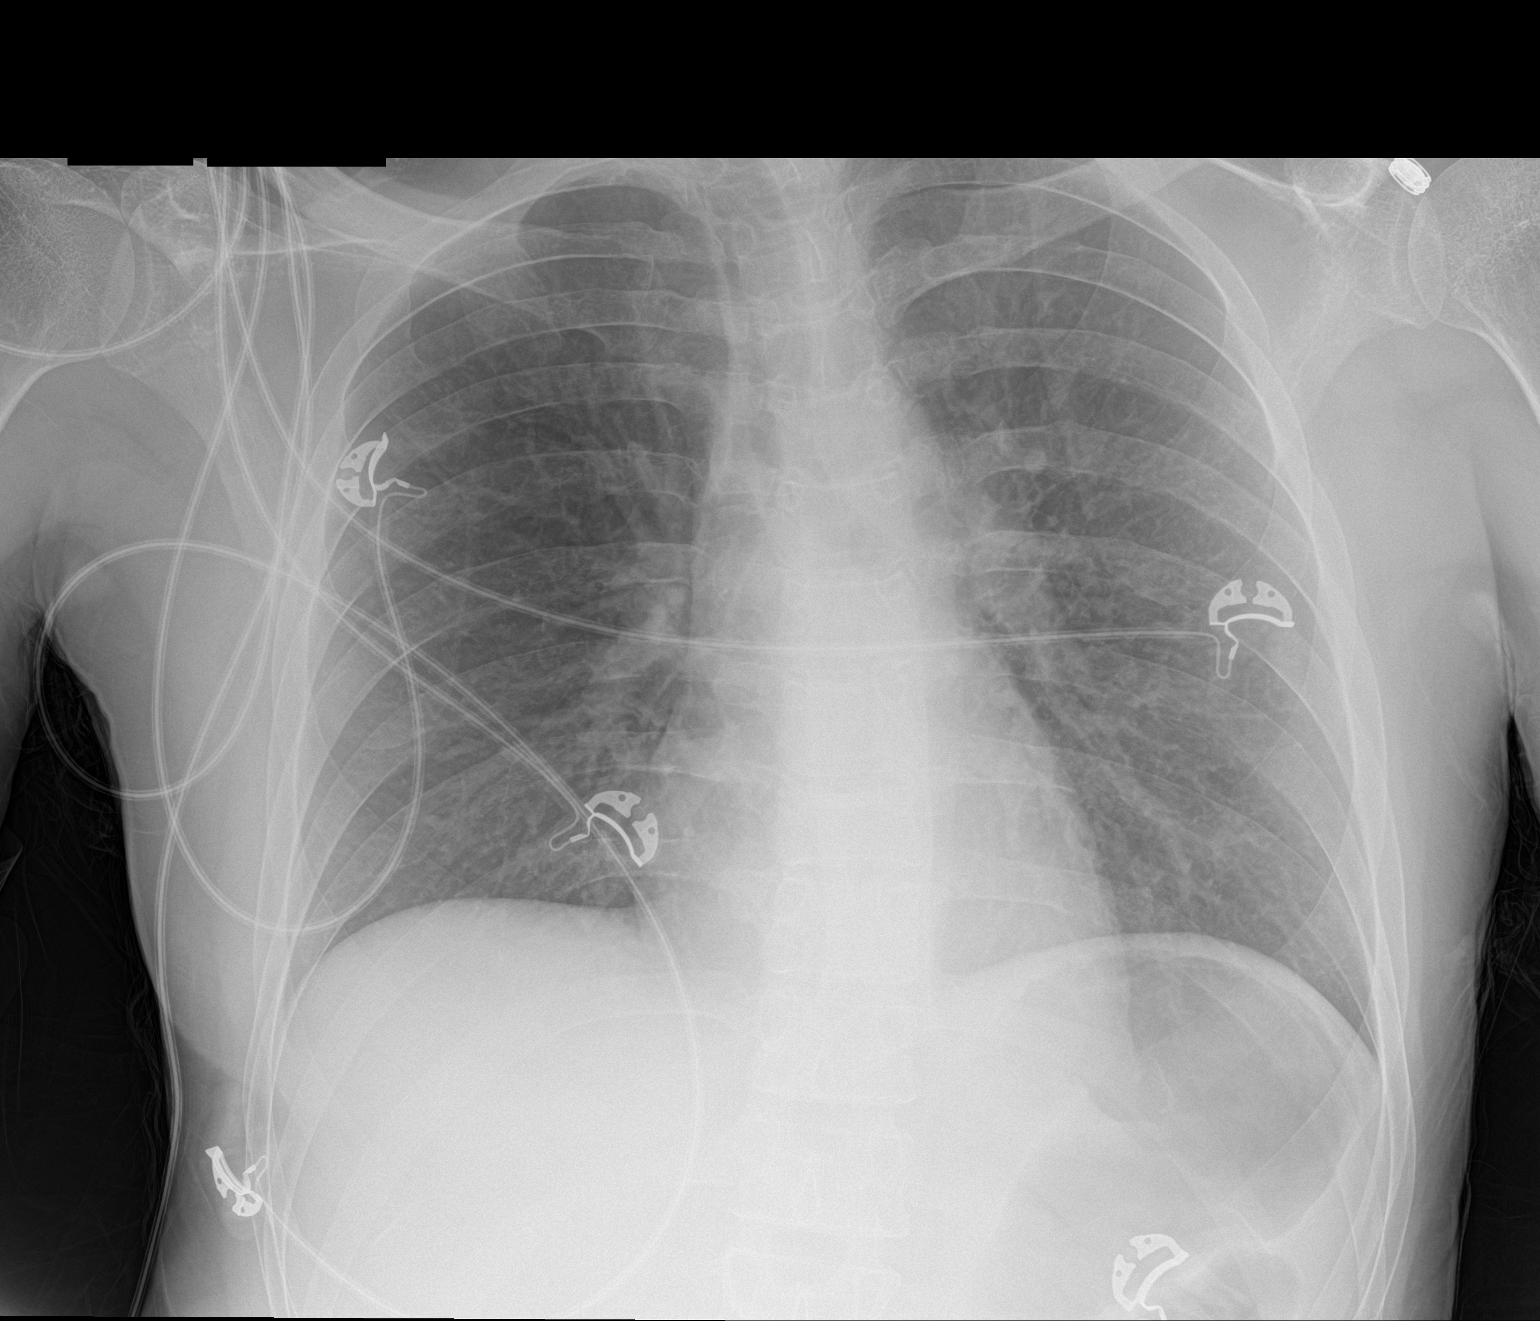

[1 of 1 positions shown; findings below may reference images not displayed]

FINDINGS: The heart size and mediastinal contours are within normal limits.
Both lungs are clear. No pneumothorax or pleural effusion is noted.
The visualized skeletal structures are unremarkable.
IMPRESSION: No acute cardiopulmonary abnormality seen.

## 2017-07-28 ENCOUNTER — Encounter (HOSPITAL_COMMUNITY): Payer: Self-pay | Admitting: Emergency Medicine

## 2017-07-28 ENCOUNTER — Emergency Department (HOSPITAL_COMMUNITY)
Admission: EM | Admit: 2017-07-28 | Discharge: 2017-07-28 | Disposition: A | Discharge status: Home / Self Care | Payer: Medicaid Other | Attending: Emergency Medicine | Admitting: Emergency Medicine

## 2017-07-28 DIAGNOSIS — F1210 Cannabis abuse, uncomplicated: Secondary | ICD-10-CM | POA: Diagnosis not present

## 2017-07-28 DIAGNOSIS — F17210 Nicotine dependence, cigarettes, uncomplicated: Secondary | ICD-10-CM | POA: Insufficient documentation

## 2017-07-28 DIAGNOSIS — R1110 Vomiting, unspecified: Secondary | ICD-10-CM | POA: Diagnosis present

## 2017-07-28 DIAGNOSIS — G43A1 Cyclical vomiting, intractable: Secondary | ICD-10-CM | POA: Diagnosis not present

## 2017-07-28 DIAGNOSIS — R1115 Cyclical vomiting syndrome unrelated to migraine: Secondary | ICD-10-CM

## 2017-07-28 DIAGNOSIS — R109 Unspecified abdominal pain: Secondary | ICD-10-CM | POA: Insufficient documentation

## 2017-07-28 DIAGNOSIS — Z79899 Other long term (current) drug therapy: Secondary | ICD-10-CM | POA: Insufficient documentation

## 2017-07-28 LAB — CBC
HCT: 48.4 % (ref 39.0–52.0)
Hemoglobin: 16.1 g/dL (ref 13.0–17.0)
MCH: 29.6 pg (ref 26.0–34.0)
MCHC: 33.3 g/dL (ref 30.0–36.0)
MCV: 89 fL (ref 78.0–100.0)
PLATELETS: 262 10*3/uL (ref 150–400)
RBC: 5.44 MIL/uL (ref 4.22–5.81)
RDW: 11.8 % (ref 11.5–15.5)
WBC: 8.2 10*3/uL (ref 4.0–10.5)

## 2017-07-28 LAB — COMPREHENSIVE METABOLIC PANEL
ALK PHOS: 103 U/L (ref 38–126)
ALT: 56 U/L (ref 17–63)
AST: 45 U/L — AB (ref 15–41)
Albumin: 4.6 g/dL (ref 3.5–5.0)
Anion gap: 12 (ref 5–15)
BUN: 8 mg/dL (ref 6–20)
CALCIUM: 9.4 mg/dL (ref 8.9–10.3)
CO2: 28 mmol/L (ref 22–32)
CREATININE: 0.91 mg/dL (ref 0.61–1.24)
Chloride: 96 mmol/L — ABNORMAL LOW (ref 101–111)
Glucose, Bld: 100 mg/dL — ABNORMAL HIGH (ref 65–99)
Potassium: 3.7 mmol/L (ref 3.5–5.1)
Sodium: 136 mmol/L (ref 135–145)
Total Bilirubin: 0.8 mg/dL (ref 0.3–1.2)
Total Protein: 8.1 g/dL (ref 6.5–8.1)

## 2017-07-28 LAB — LIPASE, BLOOD: Lipase: 31 U/L (ref 11–51)

## 2017-07-28 MED ORDER — SODIUM CHLORIDE 0.9 % IV BOLUS (SEPSIS)
1000.0000 mL | Freq: Once | INTRAVENOUS | Status: AC
Start: 2017-07-28 — End: 2017-07-28
  Administered 2017-07-28: 1000 mL via INTRAVENOUS

## 2017-07-28 MED ORDER — CAPSAICIN 0.025 % EX CREA
TOPICAL_CREAM | CUTANEOUS | Status: AC
  Administered 2017-07-28: 18:00:00 via TOPICAL
  Filled 2017-07-28: qty 60

## 2017-07-28 MED ORDER — ONDANSETRON 8 MG PO TBDP: 8.0000 mg | ORAL_TABLET | Freq: Three times a day (TID) | ORAL | 0 refills | Status: DC | PRN

## 2017-07-28 MED ORDER — SODIUM CHLORIDE 0.9 % IV BOLUS (SEPSIS)
1000.0000 mL | Freq: Once | INTRAVENOUS | Status: AC
  Administered 2017-07-28: 1000 mL via INTRAVENOUS

## 2017-07-28 MED ORDER — SODIUM CHLORIDE 0.9 % IV SOLN: 1000.0000 mL | INTRAVENOUS | Status: DC

## 2017-07-28 MED ORDER — ONDANSETRON 4 MG PO TBDP
4.0000 mg | ORAL_TABLET | Freq: Once | ORAL | Status: AC | PRN
  Administered 2017-07-28: 4 mg via ORAL
  Filled 2017-07-28: qty 1

## 2017-07-28 MED ORDER — LORAZEPAM 2 MG/ML IJ SOLN
0.5000 mg | Freq: Once | INTRAMUSCULAR | Status: AC
Start: 2017-07-28 — End: 2017-07-28
  Administered 2017-07-28: 0.5 mg via INTRAVENOUS
  Filled 2017-07-28: qty 1

## 2017-07-28 NOTE — ED Triage Notes (Signed)
Pt presents to ED for assessment for CV[facility] (cyclical vomiting syndrome).  States long hx of same, but he does not have any nausea medicine at home at this time, and he has been vomiting for six 6 days.  States LUQ abdominal pain, worse before a bowel movement.  C/o diarrhea.

## 2017-07-28 NOTE — ED Notes (Signed)
Patient given discharge instructions and verbalized understanding.  Patient stable to discharge at this time.  Patient is alert and oriented to baseline.  No distressed noted at this time.  All belongings taken with the patient at discharge.   

## 2017-07-28 NOTE — ED Provider Notes (Signed)
[facility] EMERGENCY DEPARTMENT Provider Note   CSN: [id number] Arrival date & time: 07/28/17  1306     History   Chief Complaint Chief Complaint  Patient presents with  . Emesis    HPI [name] is a 37 y.o. male.  HPI 37 year old male presents today stating that he has cyclical vomiting syndrome and has been vomiting for the past 7 days.  He states that today he is "dry heaving".  He states he has been unable to keep down fluids.  He reports a history of marijuana use.  He also reports a history of anxiety and chronic benzodiazepine use.  He states his entire abdomen hurts.  He denies any fever, chills, chest pain, cough, dyspnea, or rashes.  He states he has had some loose bowel movements.  He reports taking his medications as prescribed but is not sure how much of which she has been able to keep down.  He reports that he has been taking Zofran but is out of it and that it "quit working at the end" Past Medical History:  Diagnosis Date  . Anxiety   . Benzodiazepine abuse (HCC)   . Chronic tachycardia    Per Dr. Doreene Eland (psych)  . Depression   . Drug use   . History of IBS   . Polysubstance abuse [facility])     Patient Active Problem List   Diagnosis Date Noted  . Depression   . PTSD (post-traumatic stress disorder)   . Tachycardia   . Acute blood loss anemia   . Weakness 09/12/2015  . Respiratory failure, acute (HCC)   . Respiratory failure (HCC)   . Atelectasis   . Altered mental status 09/03/2015  . Opioid overdose (HCC) 08/29/2015  . Leukocytosis 08/29/2015  . Anemia 08/29/2015  . Polysubstance abuse (HCC) 08/29/2015  . Tobacco abuse 08/29/2015    History reviewed. No pertinent surgical history.      Home Medications    Prior to Admission medications   Medication Sig Start Date End Date Taking? Authorizing Provider  ALPRAZolam Prudy Feeler) 1 MG tablet Take 1 mg by mouth 3 (three) times daily. 07/02/17  Yes [provider]    SAPHRIS 10 MG SUBL Place 10 mg under the tongue daily. 07/02/17  Yes [provider]  Buprenorphine HCl-Naloxone HCl (SUBOXONE) 8-2 MG FILM Place 1 Film under the tongue at bedtime. Patient not taking: Reported on 07/28/2017 09/13/15   Haydee Salter, MD  methylPREDNISolone (MEDROL DOSEPAK) 4 MG TBPK tablet Follow instructions on package. Patient not taking: Reported on 07/28/2017 05/13/17   Sharlene Dory, DO    Family History History reviewed. No pertinent family history.  Social History Social History   Tobacco Use  . Smoking status: Current Every Day Smoker    Packs/day: 0.50    Types: Cigarettes  . Smokeless tobacco: Never Used  Substance Use Topics  . Alcohol use: No  . Drug use: No    Types: Marijuana     Allergies   Compazine [prochlorperazine edisylate]; Nsaids; Phenergan [promethazine hcl]; and Tramadol   Review of Systems Review of Systems  Constitutional: Negative.   HENT: Negative.   Eyes: Negative.   Respiratory: Negative.   Cardiovascular: Negative.   Gastrointestinal: Positive for abdominal pain, nausea and vomiting.  Endocrine: Negative.   Genitourinary: Negative.   Musculoskeletal: Negative.   Skin: Negative.   Allergic/Immunologic: Negative.   Neurological: Positive for weakness.  Hematological: Negative.   Psychiatric/Behavioral: Positive for agitation.  All  other systems reviewed and are negative.    Physical Exam Updated Vital Signs BP (!) 112/92 (BP Location: Right Arm)   Pulse (!) 122   Temp 98.5 F (36.9 C) (Oral)   Resp 17   SpO2 100%   Physical Exam  Constitutional: He is oriented to person, place, and time. He appears well-developed and well-nourished.  HENT:  Head: Normocephalic and atraumatic.  Right Ear: External ear normal.  Left Ear: External ear normal.  Mucous membranes are dry  Eyes: Pupils are equal, round, and reactive to light. EOM are normal.  Neck: Normal range of motion. Neck supple.   Cardiovascular: Tachycardia present.  Pulmonary/Chest: Effort normal and breath sounds normal.  Abdominal: Soft. Bowel sounds are normal. He exhibits no distension and no mass. There is no tenderness. There is no guarding.  Musculoskeletal: Normal range of motion.  Neurological: He is alert and oriented to person, place, and time. He displays normal reflexes. No cranial nerve deficit. He exhibits normal muscle tone. Coordination normal.  Skin: Skin is warm and dry. Capillary refill takes less than 2 seconds.  Psychiatric: His mood appears anxious.  Nursing note and vitals reviewed.    ED Treatments / Results  Labs (all labs ordered are listed, but only abnormal results are displayed) Labs Reviewed  COMPREHENSIVE METABOLIC PANEL - Abnormal; Notable for the following components:      Result Value   Chloride 96 (*)    Glucose, Bld 100 (*)    AST 45 (*)    All other components within normal limits  LIPASE, BLOOD  CBC  URINALYSIS, ROUTINE W REFLEX MICROSCOPIC    EKG None  Radiology No results found.  Procedures Procedures (including critical care time)  Medications Ordered in ED Medications  sodium chloride 0.9 % bolus 1,000 mL (has no administration in time range)    Followed by  sodium chloride 0.9 % bolus 1,000 mL (has no administration in time range)    Followed by  0.9 %  sodium chloride infusion (has no administration in time range)  capsaicin (ZOSTRIX) 0.025 % cream (has no administration in time range)  LORazepam (ATIVAN) injection 0.5 mg (has no administration in time range)  ondansetron (ZOFRAN-ODT) disintegrating tablet 4 mg (4 mg Oral Given 07/28/17 1333)     Initial Impression / Assessment and Plan / ED Course  I have reviewed the triage vital signs and the nursing notes.  Pertinent labs & imaging results that were available during my care of the patient were reviewed by me and considered in my medical decision making (see chart for details).      37 year old male presents today with cyclical vomiting syndrome.  He is treated here with IV fluids, capsaicin, and is now tolerating fluids.  Plan discharge with Zofran for home use.  Patient and mother advised regarding return precautions need for follow-up and voiced understanding.  Final Clinical Impressions(s) / ED Diagnoses   Final diagnoses:  Intractable cyclical vomiting with nausea    ED Discharge Orders    None       Margarita Grizzleay, Anaeli Cornwall, MD 07/28/17 Rickey [id number]

## 2017-10-05 ENCOUNTER — Other Ambulatory Visit: Payer: Self-pay

## 2017-10-05 ENCOUNTER — Emergency Department (HOSPITAL_COMMUNITY)
Admission: EM | Admit: 2017-10-05 | Discharge: 2017-10-06 | Disposition: A | Discharge status: Home / Self Care | Payer: Medicaid Other | Attending: Emergency Medicine | Admitting: Emergency Medicine

## 2017-10-05 ENCOUNTER — Encounter (HOSPITAL_COMMUNITY): Payer: Self-pay | Admitting: *Deleted

## 2017-10-05 DIAGNOSIS — R4689 Other symptoms and signs involving appearance and behavior: Secondary | ICD-10-CM | POA: Diagnosis present

## 2017-10-05 DIAGNOSIS — Z79899 Other long term (current) drug therapy: Secondary | ICD-10-CM | POA: Diagnosis not present

## 2017-10-05 DIAGNOSIS — F1114 Opioid abuse with opioid-induced mood disorder: Secondary | ICD-10-CM | POA: Diagnosis not present

## 2017-10-05 DIAGNOSIS — F17210 Nicotine dependence, cigarettes, uncomplicated: Secondary | ICD-10-CM | POA: Insufficient documentation

## 2017-10-05 DIAGNOSIS — F332 Major depressive disorder, recurrent severe without psychotic features: Secondary | ICD-10-CM | POA: Insufficient documentation

## 2017-10-05 DIAGNOSIS — F69 Unspecified disorder of adult personality and behavior: Secondary | ICD-10-CM

## 2017-10-05 LAB — CBC WITH DIFFERENTIAL/PLATELET
BASOS ABS: 0 10*3/uL (ref 0.0–0.1)
Basophils Relative: 0 %
EOS PCT: 2 %
Eosinophils Absolute: 0.2 10*3/uL (ref 0.0–0.7)
HEMATOCRIT: 43.1 % (ref 39.0–52.0)
Hemoglobin: 14.4 g/dL (ref 13.0–17.0)
LYMPHS PCT: 23 %
Lymphs Abs: 1.9 10*3/uL (ref 0.7–4.0)
MCH: 30.5 pg (ref 26.0–34.0)
MCHC: 33.4 g/dL (ref 30.0–36.0)
MCV: 91.3 fL (ref 78.0–100.0)
MONOS PCT: 7 %
Monocytes Absolute: 0.6 10*3/uL (ref 0.1–1.0)
Neutro Abs: 5.7 10*3/uL (ref 1.7–7.7)
Neutrophils Relative %: 68 %
PLATELETS: 304 10*3/uL (ref 150–400)
RBC: 4.72 MIL/uL (ref 4.22–5.81)
RDW: 12.7 % (ref 11.5–15.5)
WBC: 8.3 10*3/uL (ref 4.0–10.5)

## 2017-10-05 LAB — COMPREHENSIVE METABOLIC PANEL
ALBUMIN: 4.8 g/dL (ref 3.5–5.0)
ALT: 14 U/L (ref 0–44)
ANION GAP: 9 (ref 5–15)
AST: 22 U/L (ref 15–41)
Alkaline Phosphatase: 96 U/L (ref 38–126)
BUN: 15 mg/dL (ref 6–20)
CO2: 31 mmol/L (ref 22–32)
Calcium: 9.7 mg/dL (ref 8.9–10.3)
Chloride: 102 mmol/L (ref 98–111)
Creatinine, Ser: 0.96 mg/dL (ref 0.61–1.24)
GFR calc Af Amer: >60 mL/min (ref >60)
GFR calc non Af Amer: >60 mL/min (ref >60)
GLUCOSE: 89 mg/dL (ref 70–99)
POTASSIUM: 3.6 mmol/L (ref 3.5–5.1)
SODIUM: 142 mmol/L (ref 135–145)
TOTAL PROTEIN: 8.7 g/dL — AB (ref 6.5–8.1)
Total Bilirubin: 0.6 mg/dL (ref 0.3–1.2)

## 2017-10-05 LAB — ETHANOL: Alcohol, Ethyl (B): <10 mg/dL (ref <10)

## 2017-10-05 LAB — ACETAMINOPHEN LEVEL: Acetaminophen (Tylenol), Serum: <10 ug/mL — ABNORMAL LOW (ref 10–30)

## 2017-10-05 LAB — SALICYLATE LEVEL: Salicylate Lvl: <7 mg/dL (ref 2.8–30.0)

## 2017-10-05 LAB — RAPID URINE DRUG SCREEN, HOSP PERFORMED
Amphetamines: POSITIVE — AB
BARBITURATES: NOT DETECTED
Benzodiazepines: POSITIVE — AB
COCAINE: NOT DETECTED
TETRAHYDROCANNABINOL: POSITIVE — AB

## 2017-10-05 MED ORDER — ACETAMINOPHEN 325 MG PO TABS: 650.0000 mg | ORAL_TABLET | ORAL | Status: DC | PRN

## 2017-10-05 MED ORDER — ONDANSETRON 8 MG PO TBDP: 8.0000 mg | ORAL_TABLET | Freq: Three times a day (TID) | ORAL | Status: DC | PRN

## 2017-10-05 MED ORDER — ALPRAZOLAM 1 MG PO TABS
1.0000 mg | ORAL_TABLET | Freq: Three times a day (TID) | ORAL | Status: DC
  Administered 2017-10-05: 1 mg via ORAL
  Filled 2017-10-05: qty 1
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
## 2017-10-05 NOTE — ED Notes (Signed)
Patient's medications taken to pharmacy for safe keeping.  See chart for list.

## 2017-10-05 NOTE — ED Provider Notes (Signed)
[facility]-EMERGENCY DEPT Provider Note   CSN: [id number] Arrival date & time: 10/05/17  1442     History   Chief Complaint Chief Complaint  Patient presents with  . IVC    HPI [name] is a 37 y.o. male past medical's anxiety, polysubstance abuse, depression who presents for evaluation of aggressive behavior.  Patient brought in by police after mom filled out IVC paperwork for concerns of worsening aggression.  Patient currently lives with his mother and per police report, he is becoming increasingly more aggressive and agitated.  Patient states that him and his mom have had a lot of conflict recently and they have gotten into some verbal arguments.  He denies making any threatening comments or any aggressive or physical accidents or.  He states that his mom has a history of bipolar and will often "make someone else the bad guy."  He states that his mom kicked him out last night and states that he could not come home until he was evaluated by behavioral health.  Patient states that his mom is telling him to say that he is suicidal.  He currently denies any SI, HI.  Patient reports that last night, he spent the night with his uncle.  He does report that he used heroin last night.  He denies any cocaine use.  He does report recent marijuana use.  Patient denies any auditory or visual hallucinations.  Patient denies any planes.  The history is provided by the patient.    Past Medical History:  Diagnosis Date  . Anxiety   . Benzodiazepine abuse (HCC)   . Chronic tachycardia    Per Dr. Doreene Eland (psych)  . Depression   . Drug use   . History of IBS   . Polysubstance abuse [facility])     Patient Active Problem List   Diagnosis Date Noted  . Depression   . PTSD (post-traumatic stress disorder)   . Tachycardia   . Acute blood loss anemia   . Weakness 09/12/2015  . Respiratory failure, acute (HCC)   . Respiratory failure (HCC)   . Atelectasis   . Altered  mental status 09/03/2015  . Opioid overdose (HCC) 08/29/2015  . Leukocytosis 08/29/2015  . Anemia 08/29/2015  . Polysubstance abuse (HCC) 08/29/2015  . Tobacco abuse 08/29/2015    History reviewed. No pertinent surgical history.      Home Medications    Prior to Admission medications   Medication Sig Start Date End Date Taking? Authorizing Provider  ALPRAZolam Prudy Feeler) 1 MG tablet Take 1 mg by mouth 3 (three) times daily as needed for anxiety.  07/02/17  Yes [provider]  Buprenorphine HCl-Naloxone HCl (SUBOXONE) 8-2 MG FILM Place 20 mg of opioid under the tongue daily.   Yes [provider]  fluvoxaMINE (LUVOX) 50 MG tablet Take 50 mg by mouth at bedtime.  07/31/17  Yes [provider]  ondansetron (ZOFRAN ODT) 8 MG disintegrating tablet Take 1 tablet (8 mg total) by mouth every 8 (eight) hours as needed for nausea or vomiting. 07/28/17  Yes Margarita Grizzle, MD  polyethylene glycol (MIRALAX / GLYCOLAX) packet Take 17 g by mouth at bedtime.   Yes [provider]  SAPHRIS 10 MG SUBL Place 10 mg under the tongue at bedtime.  07/02/17  Yes [provider]  Buprenorphine HCl-Naloxone HCl (SUBOXONE) 8-2 MG FILM Place 1 Film under the tongue at bedtime. Patient not taking: Reported on 07/28/2017 09/13/15   Haydee Salter,  MD  methylPREDNISolone (MEDROL DOSEPAK) 4 MG TBPK tablet Follow instructions on package. Patient not taking: Reported on 07/28/2017 05/13/17   Sharlene DoryWendling, Nicholas Paul, DO    Family History No family history on file.  Social History Social History   Tobacco Use  . Smoking status: Current Every Day Smoker    Packs/day: 0.50    Types: Cigarettes  . Smokeless tobacco: Never Used  Substance Use Topics  . Alcohol use: No  . Drug use: No    Types: Marijuana     Allergies   Compazine [prochlorperazine edisylate]; Nsaids; Phenergan [promethazine hcl]; and Tramadol   Review of Systems Review of Systems  Constitutional:  Negative for fever.  Respiratory: Negative for shortness of breath.   Cardiovascular: Negative for chest pain.  Gastrointestinal: Negative for abdominal pain and vomiting.  Neurological: Negative for headaches.  Psychiatric/Behavioral: Positive for agitation and behavioral problems. Negative for suicidal ideas.  All other systems reviewed and are negative.    Physical Exam Updated Vital Signs BP (!) 131/91 (BP Location: Right Arm)   Pulse (!) 115   Temp 98.3 F (36.8 C) (Oral)   Resp 18   SpO2 97%   Physical Exam  Constitutional: He is oriented to person, place, and time. He appears well-developed and well-nourished.  HENT:  Head: Normocephalic and atraumatic.  Mouth/Throat: Oropharynx is clear and moist and mucous membranes are normal.  Eyes: Pupils are equal, round, and reactive to light. Conjunctivae, EOM and lids are normal.  Neck: Full passive range of motion without pain.  Cardiovascular: Normal rate, regular rhythm, normal heart sounds and normal pulses. Exam reveals no gallop and no friction rub.  No murmur heard. Pulmonary/Chest: Effort normal and breath sounds normal.  Abdominal: Soft. Normal appearance. There is no tenderness. There is no rigidity and no guarding.  Musculoskeletal: Normal range of motion.  Neurological: He is alert and oriented to person, place, and time.  Normal coordination Normal strength  Skin: Skin is warm and dry. Capillary refill takes less than 2 seconds.  Psychiatric: He has a normal mood and affect. His speech is normal and behavior is normal. He expresses no homicidal and no suicidal ideation. He expresses no suicidal plans and no homicidal plans.  Makes appropriate eye contact throughout the exam.  Nursing note and vitals reviewed.    ED Treatments / Results  Labs (all labs ordered are listed, but only abnormal results are displayed) Labs Reviewed  COMPREHENSIVE METABOLIC PANEL - Abnormal; Notable for the following components:       Result Value   Total Protein 8.7 (*)    All other components within normal limits  RAPID URINE DRUG SCREEN, HOSP PERFORMED - Abnormal; Notable for the following components:   Opiates   (*)    Value: Result not available. Reagent lot number recalled by manufacturer.   Benzodiazepines POSITIVE (*)    Amphetamines POSITIVE (*)    Tetrahydrocannabinol POSITIVE (*)    All other components within normal limits  ACETAMINOPHEN LEVEL - Abnormal; Notable for the following components:   Acetaminophen (Tylenol), Serum <10 (*)    All other components within normal limits  ETHANOL  CBC WITH DIFFERENTIAL/PLATELET  SALICYLATE LEVEL    EKG None  Radiology No results found.  Procedures Procedures (including critical care time)  Medications Ordered in ED Medications  acetaminophen (TYLENOL) tablet 650 mg (has no administration in time range)  ALPRAZolam (XANAX) tablet 1 mg (has no administration in time range)  ondansetron (ZOFRAN-ODT) disintegrating tablet 8 mg (  has no administration in time range)     Initial Impression / Assessment and Plan / ED Course  I have reviewed the triage vital signs and the nursing notes.  Pertinent labs & imaging results that were available during my care of the patient were reviewed by me and considered in my medical decision making (see chart for details).     37 year old male with past medical history of polysubstance abuse, depression, anxiety presents for evaluation of medical clearance after IVC was initiated for aggressive behavior with mom.  Denies any SI, HI, hallucinations. Patient is afebrile, non-toxic appearing, sitting comfortably on examination table. Vital signs reviewed and stable.  Plan to check medical clearance labs.  Plan for consult TTS.  I discussed with patient's mom, [name].  She states that her and patient has been having more verbal arguments lately.  She states that when they argue, patient gets visibly upset and will begin to shake.   She states that patient has never threatened to hurt her and has not had any physical altercation with her.  She states that she is concerned that he is going to get so bad that he will "blow."  She states that he has not expressed any suicidal or homicidal ideations but she is worried about drug use and states that if "he keeps using, he will kill himself from all the drugs."  Acetaminophen level unremarkable.  Ethanol level.  Salicylate level unremarkable.  CMP is without any acute abnormalities.  Urine rapid drug screen is positive for benzos, amphetamines, marijuana.  CBC without any significant leukocytosis, anemia.  Discussed with Behavioral Health after evaluation of the patient. They are recommending overnight observation. Patient is medically cleared.   Final Clinical Impressions(s) / ED Diagnoses   Final diagnoses:  Behavior concern in adult    ED Discharge Orders    None       Rosana HoesLayden, Presly Steinruck A, PA-C 10/05/17 2109    Gerhard MunchLockwood, Robert, MD 10/05/17 2333

## 2017-10-05 NOTE — ED Triage Notes (Addendum)
IVC pt brought in by police. Paperwork states pt. is "becoming aggressive w/ his mother and mother fears he may become aggressive w/ her."  Pt denies SI/HI/hallucinations. Pt states he is having more conflicts with his mother recently and has been kicked out of her house.

## 2017-10-05 NOTE — BH Assessment (Addendum)
[facility] Assessment Progress Note  Case was staffed with Parks FNP who recommended patient be observed and monitored for safety. Patient will be seen by psychiatry in the a.m.     

## 2017-10-05 NOTE — ED Notes (Signed)
Bed: WLPT2 Expected date:  Expected time:  Means of arrival:  Comments: 

## 2017-10-06 DIAGNOSIS — F1114 Opioid abuse with opioid-induced mood disorder: Secondary | ICD-10-CM

## 2017-10-06 MED ORDER — LOPERAMIDE HCL 2 MG PO CAPS: 2.0000 mg | ORAL_CAPSULE | ORAL | Status: DC | PRN

## 2017-10-06 MED ORDER — HYDROXYZINE HCL 25 MG PO TABS: 25.0000 mg | ORAL_TABLET | Freq: Three times a day (TID) | ORAL | Status: DC | PRN

## 2017-10-06 MED ORDER — FLUVOXAMINE MALEATE 50 MG PO TABS
100.0000 mg | ORAL_TABLET | Freq: Two times a day (BID) | ORAL | Status: DC
  Administered 2017-10-06: 100 mg via ORAL
  Filled 2017-10-06: qty 2

## 2017-10-06 MED ORDER — METHOCARBAMOL 500 MG PO TABS: 500.0000 mg | ORAL_TABLET | Freq: Three times a day (TID) | ORAL | Status: DC | PRN

## 2017-10-06 MED ORDER — DICYCLOMINE HCL 20 MG PO TABS: 20.0000 mg | ORAL_TABLET | Freq: Four times a day (QID) | ORAL | Status: DC | PRN

## 2017-10-06 MED ORDER — CLONIDINE HCL 0.1 MG PO TABS: 0.1000 mg | ORAL_TABLET | Freq: Every day | ORAL | Status: DC

## 2017-10-06 MED ORDER — CLONIDINE HCL 0.1 MG PO TABS: 0.1000 mg | ORAL_TABLET | ORAL | Status: DC

## 2017-10-06 MED ORDER — CLONIDINE HCL 0.1 MG PO TABS
0.1000 mg | ORAL_TABLET | Freq: Four times a day (QID) | ORAL | Status: DC
  Administered 2017-10-06: 0.1 mg via ORAL
  Filled 2017-10-06: qty 1

## 2017-10-06 MED ORDER — GABAPENTIN 300 MG PO CAPS
300.0000 mg | ORAL_CAPSULE | Freq: Two times a day (BID) | ORAL | Status: DC
  Administered 2017-10-06: 300 mg via ORAL
  Filled 2017-10-06: qty 1

## 2017-10-06 MED ORDER — FLUOXETINE HCL 20 MG PO CAPS
20.0000 mg | ORAL_CAPSULE | Freq: Every day | ORAL | Status: DC
  Filled 2017-10-06: qty 1

## 2017-10-06 MED ORDER — ONDANSETRON 4 MG PO TBDP: 4.0000 mg | ORAL_TABLET | Freq: Four times a day (QID) | ORAL | Status: DC | PRN

## 2017-10-06 MED ORDER — HYDROXYZINE HCL 25 MG PO TABS: 25.0000 mg | ORAL_TABLET | Freq: Four times a day (QID) | ORAL | Status: DC | PRN

## 2017-10-06 NOTE — ED Notes (Signed)
Peer support at bedside 

## 2017-10-06 NOTE — ED Notes (Signed)
Bed: W[facility]39 Expected date:  Expected time:  Means of arrival:  Comments: 28 

## 2017-10-06 NOTE — ED Notes (Signed)
Pt talking on hallway phone.  

## 2017-10-06 NOTE — ED Notes (Signed)
Pt d/c home per MD order. Discharge summary reviewed with pt. Pt verbalizes understanding. Pt denies SI/HI/AVH. Personal property returned to pt. Pt signed e-signature. Ambulatory off unit.  

## 2017-10-06 NOTE — Patient Outreach (Signed)
ED Peer Support Specialist Patient Intake (Complete at intake & 30-60 Day Follow-up)  Name: [name]  MRN: [id number]  Age: 37 y.o.   Date of Admission: 10/06/2017  Intake: Initial Comments:      Primary Reason Admitted: IVC by mother  Lab values: Alcohol/ETOH: Negative Positive UDS? Drug screen not completed Amphetamines: Yes Barbiturates: No Benzodiazepines: Yes Cocaine: No Opiates: Drug screen not completed Cannabinoids: Yes  Demographic information: Gender: Male Ethnicity: White Marital Status: Single Insurance Status: Best boy (Work Neurosurgeon, Physicist, medical, etc.: Yes(WIl be reciving SSI soon) Lives with: Parent Living situation: House/Apartment  Reported Patient History: Patient reported health conditions: Other (comment), Anxiety disorders, Trauma(Panic Disorder, Generalized Anxiety Disorder, and PTSD) Patient aware of HIV and hepatitis status: Yes (comment)(Negative)  In past year, has patient visited ED for any reason? Yes  Number of ED visits: 4  Reason(s) for visit: pneumonia, achilles tendon injury, naseua   In past year, has patient been hospitalized for any reason? No  Number of hospitalizations:    Reason(s) for hospitalization:    In past year, has patient been arrested? No  Number of arrests:    Reason(s) for arrest:    In past year, has patient been incarcerated? No  Number of incarcerations:    Reason(s) for incarceration:    In past year, has patient received medication-assisted treatment? Yes, Office-based Opioid Treatment (OBOT)([facility] for suboxone treatment)  In past year, patient received the following treatments:    In past year, has patient received any harm reduction services? No  Did this include any of the following?    In past year, has patient received care from a mental health provider for diagnosis other than SUD? Yes([facility])  In past  year, is this first time patient has overdosed? (has not overdosed)  Number of past overdoses:    In past year, is this first time patient has been hospitalized for an overdose? (has not overdosed)  Number of hospitalizations for overdose(s):    Is patient currently receiving treatment for a mental health diagnosis? Yes  Patient reports experiencing difficulty participating in SUD treatment: No    Most important reason(s) for this difficulty?    Has patient received prior services for treatment? Yes  In past, patient has received services from following agencies:    Plan of Care:  Suggested follow up at these agencies/treatment centers: (Patient plans to stay connected with [facility] for further help with suboxone MAT treatment and mental health treatment. )  Other information: CPSS met with the patient to provide substance use recovery support and help with recovery resources. Patient states that he plans to continue to receive MAT treatment from his suboxone provider at [facility]. Patient states that he does not need any help with any other recovery resources at this time. CPSS provided CPSS contact information. CPSS informed the patient to contact CPSS for any help with recovery resources or for recovery support after discharge from the hospital.    Mason Jim, CPSS  10/06/2017 11:35 AM

## 2017-10-06 NOTE — ED Notes (Signed)
Pt to room #39. Pt blaming mother for being at the hospital. Pt denies SI/HI/AVH. Irritable at times. Encouragement and support provided. Special checks q 15 mins in place for safety, Video monitoring in place. Will continue to monitor.
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
## 2017-10-06 NOTE — BH Assessment (Signed)
[facility]      Per Elta GuadeloupeLaurie Parks, NP and Dr Jannifer FranklinAkintayo, patient can be discharged to [facility]

## 2017-11-18 ENCOUNTER — Ambulatory Visit (HOSPITAL_COMMUNITY)
Admission: EM | Admit: 2017-11-18 | Discharge: 2017-11-18 | Disposition: A | Discharge status: Home / Self Care | Payer: Medicaid Other | Attending: Family Medicine | Admitting: Family Medicine

## 2017-11-18 ENCOUNTER — Encounter (HOSPITAL_COMMUNITY): Payer: Self-pay | Admitting: Emergency Medicine

## 2017-11-18 ENCOUNTER — Ambulatory Visit (INDEPENDENT_AMBULATORY_CARE_PROVIDER_SITE_OTHER): Payer: Medicaid Other

## 2017-11-18 DIAGNOSIS — R059 Cough, unspecified: Secondary | ICD-10-CM

## 2017-11-18 DIAGNOSIS — R05 Cough: Secondary | ICD-10-CM

## 2017-11-18 MED ORDER — ALBUTEROL SULFATE HFA 108 (90 BASE) MCG/ACT IN AERS: 1.0000 | INHALATION_SPRAY | Freq: Four times a day (QID) | RESPIRATORY_TRACT | 0 refills | Status: DC | PRN

## 2017-11-18 NOTE — ED Triage Notes (Signed)
PT has had severe pneumonia twice in the last 2 years and his left lung has collapsed in the past. PT reports cough, SOB, body aches for a few days. This is what he felt like last time.

## 2017-11-18 NOTE — Discharge Instructions (Signed)
Chest xray is normal today which is reassuring.  I have sent an inhaler you may use for the chest tightness sensation.  May continue with over the counter cough treatment as needed.  Please try to decrease to quit smoking as this can worsen your symptoms. Please establish with a primary care provider for long term management.  If symptoms worsen or do not improve in the next week to return to be seen or to follow up with your PCP.

## 2017-11-18 NOTE — ED Provider Notes (Signed)
[facility]    CSN: [id number] Arrival date & time: 11/18/17  1825     History   Chief Complaint Chief Complaint  Patient presents with  . Cough    HPI [name] is a 37 y.o. male.   [name] presents with family with complaints of chest tightness, non productive cough, headache and upper back pain which started a few days ago. History of pna with respiratory failure related to substance abuse; CAP early this year. States his symptoms feel similar. No fevers. No sore throat or congestion. No known ill contacts. No shortness of breath . No history of asthma. Smokes 15 cigarettes a day. Marland Kitchen Has been taking guaifenesin which has not helped. Hx of anxiety, substance abuse, depression, tachycardia.    ROS per HPI.      Past Medical History:  Diagnosis Date  . Anxiety   . Benzodiazepine abuse (HCC)   . Chronic tachycardia    Per Dr. Doreene Eland (psych)  . Depression   . Drug use   . History of IBS   . Polysubstance abuse [facility])     Patient Active Problem List   Diagnosis Date Noted  . Opioid abuse with opioid-induced mood disorder (HCC) 10/06/2017  . Depression   . PTSD (post-traumatic stress disorder)   . Tachycardia   . Acute blood loss anemia   . Weakness 09/12/2015  . Respiratory failure, acute (HCC)   . Respiratory failure (HCC)   . Atelectasis   . Altered mental status 09/03/2015  . Opioid overdose (HCC) 08/29/2015  . Leukocytosis 08/29/2015  . Anemia 08/29/2015  . Polysubstance abuse (HCC) 08/29/2015  . Tobacco abuse 08/29/2015    History reviewed. No pertinent surgical history.     Home Medications    Prior to Admission medications   Medication Sig Start Date End Date Taking? Authorizing Provider  ALPRAZolam Prudy Feeler) 1 MG tablet Take 1 mg by mouth 3 (three) times daily as needed for anxiety.  07/02/17  Yes [provider]  Buprenorphine HCl-Naloxone HCl (SUBOXONE) 8-2 MG FILM Place 1 Film under the tongue at bedtime. 09/13/15  Yes  Haydee Salter, MD  fluvoxaMINE (LUVOX) 50 MG tablet Take 50 mg by mouth at bedtime.  07/31/17  Yes [provider]  polyethylene glycol (MIRALAX / GLYCOLAX) packet Take 17 g by mouth at bedtime.   Yes [provider]  SAPHRIS 10 MG SUBL Place 10 mg under the tongue at bedtime.  07/02/17  Yes [provider]  albuterol (PROVENTIL HFA;VENTOLIN HFA) 108 (90 Base) MCG/ACT inhaler Inhale 1-2 puffs into the lungs every 6 (six) hours as needed for wheezing or shortness of breath. 11/18/17   Georgetta Haber, NP  Buprenorphine HCl-Naloxone HCl (SUBOXONE) 8-2 MG FILM Place 20 mg of opioid under the tongue daily.    [provider]    Family History History reviewed. No pertinent family history.  Social History Social History   Tobacco Use  . Smoking status: Current Every Day Smoker    Packs/day: 0.50    Types: Cigarettes  . Smokeless tobacco: Never Used  Substance Use Topics  . Alcohol use: No  . Drug use: No    Types: Marijuana     Allergies   Compazine [prochlorperazine edisylate]; Nsaids; Phenergan [promethazine hcl]; and Tramadol   Review of Systems Review of Systems   Physical Exam Triage Vital Signs ED Triage Vitals  Enc Vitals Group     BP 11/18/17 1917 120/71     Pulse  Rate 11/18/17 1917 85     Resp 11/18/17 1917 16     Temp 11/18/17 1917 98.3 F (36.8 C)     Temp Source 11/18/17 1917 Oral     SpO2 11/18/17 1917 100 %     Weight 11/18/17 1918 160 lb (72.6 kg)     Height --      Head Circumference --      Peak Flow --      Pain Score 11/18/17 1917 6     Pain Loc --      Pain Edu? --      Excl. in GC? --    No data found.  Updated Vital Signs BP 120/71   Pulse 85   Temp 98.3 F (36.8 C) (Oral)   Resp 16   Wt 160 lb (72.6 kg)   SpO2 100%   BMI 22.32 kg/m    Physical Exam  Constitutional: He is oriented to person, place, and time. He appears well-developed and well-nourished.  HENT:  Head: Normocephalic and  atraumatic.  Right Ear: Tympanic membrane, external ear and ear canal normal.  Left Ear: Tympanic membrane, external ear and ear canal normal.  Nose: Nose normal. Right sinus exhibits no maxillary sinus tenderness and no frontal sinus tenderness. Left sinus exhibits no maxillary sinus tenderness and no frontal sinus tenderness.  Mouth/Throat: Uvula is midline, oropharynx is clear and moist and mucous membranes are normal.  Eyes: Pupils are equal, round, and reactive to light. Conjunctivae are normal.  Neck: Normal range of motion.  Cardiovascular: Normal rate and regular rhythm.  Pulmonary/Chest: Effort normal and breath sounds normal.  Lymphadenopathy:    He has no cervical adenopathy.  Neurological: He is alert and oriented to person, place, and time.  Skin: Skin is warm and dry.  Vitals reviewed.    UC Treatments / Results  Labs (all labs ordered are listed, but only abnormal results are displayed) Labs Reviewed - No data to display  EKG None  Radiology Dg Chest 2 View  Result Date: 11/18/2017 CLINICAL DATA:  Cough, shortness of breath EXAM: CHEST - 2 VIEW COMPARISON:  04/21/2017 FINDINGS: Lungs are clear.  No pleural effusion or pneumothorax. The heart is normal in size. Visualized osseous structures are within normal limits. IMPRESSION: Normal chest radiographs. Electronically Signed   By: Charline Bills M.D.   On: 11/18/2017 20:02    Procedures Procedures (including critical care time)  Medications Ordered in UC Medications - No data to display  Initial Impression / Assessment and Plan / UC Course  I have reviewed the triage vital signs and the nursing notes.  Pertinent labs & imaging results that were available during my care of the patient were reviewed by me and considered in my medical decision making (see chart for details).     Non toxic in appearance. No increased work of breathing. No tachypnea, no tachycardia. Chest xray reassuring. Albuterol provided.  Reassurance provided. Return precautions provided. If symptoms worsen or do not improve in the next week to return to be seen or to follow up with PCP.  Patient verbalized understanding and agreeable to plan.   Encouraged to quit smoking.  Final Clinical Impressions(s) / UC Diagnoses   Final diagnoses:  Cough     Discharge Instructions     Chest xray is normal today which is reassuring.  I have sent an inhaler you may use for the chest tightness sensation.  May continue with over the counter cough treatment as needed.  Please try to decrease to quit smoking as this can worsen your symptoms. Please establish with a primary care provider for long term management.  If symptoms worsen or do not improve in the next week to return to be seen or to follow up with your PCP.      ED Prescriptions    Medication Sig Dispense Auth. Provider   albuterol (PROVENTIL HFA;VENTOLIN HFA) 108 (90 Base) MCG/ACT inhaler Inhale 1-2 puffs into the lungs every 6 (six) hours as needed for wheezing or shortness of breath. 1 Inhaler Georgetta Haber, NP     Controlled Substance Prescriptions [place] Controlled Substance Registry consulted? Not Applicable   Georgetta Haber, NP 11/18/17 2012

## 2018-01-01 ENCOUNTER — Emergency Department (HOSPITAL_COMMUNITY)
Admission: EM | Admit: 2018-01-01 | Discharge: 2018-01-01 | Disposition: A | Discharge status: Home / Self Care | Payer: Medicaid Other | Attending: Emergency Medicine | Admitting: Emergency Medicine

## 2018-01-01 ENCOUNTER — Encounter (HOSPITAL_COMMUNITY): Payer: Self-pay

## 2018-01-01 ENCOUNTER — Other Ambulatory Visit: Payer: Self-pay

## 2018-01-01 DIAGNOSIS — F1320 Sedative, hypnotic or anxiolytic dependence, uncomplicated: Secondary | ICD-10-CM | POA: Diagnosis not present

## 2018-01-01 DIAGNOSIS — F17210 Nicotine dependence, cigarettes, uncomplicated: Secondary | ICD-10-CM | POA: Insufficient documentation

## 2018-01-01 DIAGNOSIS — F13230 Sedative, hypnotic or anxiolytic dependence with withdrawal, uncomplicated: Secondary | ICD-10-CM

## 2018-01-01 DIAGNOSIS — Z79899 Other long term (current) drug therapy: Secondary | ICD-10-CM | POA: Insufficient documentation

## 2018-01-01 DIAGNOSIS — F13930 Sedative, hypnotic or anxiolytic use, unspecified with withdrawal, uncomplicated: Secondary | ICD-10-CM

## 2018-01-01 LAB — CBG MONITORING, ED: GLUCOSE-CAPILLARY: 111 mg/dL — AB (ref 70–99)

## 2018-01-01 MED ORDER — LORAZEPAM 2 MG/ML IJ SOLN
2.0000 mg | Freq: Once | INTRAMUSCULAR | Status: AC
  Administered 2018-01-01: 2 mg via INTRAVENOUS
  Filled 2018-01-01: qty 1

## 2018-01-01 MED ORDER — CHLORDIAZEPOXIDE HCL 25 MG PO CAPS
100.0000 mg | ORAL_CAPSULE | Freq: Once | ORAL | Status: AC
  Administered 2018-01-01: 100 mg via ORAL
  Filled 2018-01-01: qty 4

## 2018-01-01 NOTE — ED Provider Notes (Signed)
[facility] EMERGENCY DEPARTMENT Provider Note   CSN: [id number] Arrival date & time: 01/01/18  1714     History   Chief Complaint Chief Complaint  Patient presents with  . Withdrawal    HPI [name] is a 37 y.o. male.  37 yo M with a chief complaint of any withdrawal from benzodiazepines.  The patient has had to seek care about 15 times previously.  He says ever since he was 54.  He has been ordering medications off the Internet and taking them.  He decided that he now wants to stop taking his medicines.  States he normally takes about 50 mg of Xanax a day.  He has had a mild tremor but denies other significant symptoms.  Denies fever denies cough denies chest pain denies abdominal pain denies vomiting.  The history is provided by the patient.  Illness  This is a new problem. The current episode started yesterday. The problem occurs constantly. The problem has not changed since onset.Pertinent negatives include no chest pain, no abdominal pain, no headaches and no shortness of breath. Nothing aggravates the symptoms. Nothing relieves the symptoms. He has tried nothing for the symptoms. The treatment provided no relief.    Past Medical History:  Diagnosis Date  . Anxiety   . Benzodiazepine abuse (HCC)   . Chronic tachycardia    Per Dr. Doreene Eland (psych)  . Depression   . Drug use   . History of IBS   . Polysubstance abuse [facility])     Patient Active Problem List   Diagnosis Date Noted  . Opioid abuse with opioid-induced mood disorder (HCC) 10/06/2017  . Depression   . PTSD (post-traumatic stress disorder)   . Tachycardia   . Acute blood loss anemia   . Weakness 09/12/2015  . Respiratory failure, acute (HCC)   . Respiratory failure (HCC)   . Atelectasis   . Altered mental status 09/03/2015  . Opioid overdose (HCC) 08/29/2015  . Leukocytosis 08/29/2015  . Anemia 08/29/2015  . Polysubstance abuse (HCC) 08/29/2015  . Tobacco abuse 08/29/2015     History reviewed. No pertinent surgical history.      Home Medications    Prior to Admission medications   Medication Sig Start Date End Date Taking? Authorizing Provider  albuterol (PROVENTIL HFA;VENTOLIN HFA) 108 (90 Base) MCG/ACT inhaler Inhale 1-2 puffs into the lungs every 6 (six) hours as needed for wheezing or shortness of breath. 11/18/17   Georgetta Haber, NP  ALPRAZolam Prudy Feeler) 1 MG tablet Take 1 mg by mouth 3 (three) times daily as needed for anxiety.  07/02/17   [provider]  Buprenorphine HCl-Naloxone HCl (SUBOXONE) 8-2 MG FILM Place 1 Film under the tongue at bedtime. 09/13/15   Haydee Salter, MD  Buprenorphine HCl-Naloxone HCl (SUBOXONE) 8-2 MG FILM Place 20 mg of opioid under the tongue daily.    [provider]  fluvoxaMINE (LUVOX) 50 MG tablet Take 50 mg by mouth at bedtime.  07/31/17   [provider]  polyethylene glycol (MIRALAX / GLYCOLAX) packet Take 17 g by mouth at bedtime.    [provider]  SAPHRIS 10 MG SUBL Place 10 mg under the tongue at bedtime.  07/02/17   [provider]    Family History History reviewed. No pertinent family history.  Social History Social History   Tobacco Use  . Smoking status: Current Every Day Smoker    Packs/day: 0.50    Types: Cigarettes  . Smokeless  tobacco: Never Used  Substance Use Topics  . Alcohol use: No  . Drug use: Yes    Types: IV, Cocaine, Marijuana    Comment: last cocain 10/2017, last heroin 11/2017     Allergies   Compazine [prochlorperazine edisylate]; Nsaids; Phenergan [promethazine hcl]; and Tramadol   Review of Systems Review of Systems  Constitutional: Negative for chills and fever.  HENT: Negative for congestion and facial swelling.   Eyes: Negative for discharge and visual disturbance.  Respiratory: Negative for shortness of breath.   Cardiovascular: Negative for chest pain and palpitations.  Gastrointestinal: Negative for abdominal pain,  diarrhea and vomiting.  Musculoskeletal: Negative for arthralgias and myalgias.  Skin: Negative for color change and rash.  Neurological: Positive for tremors. Negative for syncope and headaches.       Tremor   Psychiatric/Behavioral: Negative for confusion and dysphoric mood.     Physical Exam Updated Vital Signs BP 111/62 (BP Location: Left Arm)   Pulse 93   Temp 98.2 F (36.8 C) (Oral)   Resp 14   Ht 5\' 9"  (1.753 m)   Wt 68 kg   SpO2 94%   BMI 22.15 kg/m   Physical Exam  Constitutional: He is oriented to person, place, and time. He appears well-developed and well-nourished.  HENT:  Head: Normocephalic and atraumatic.  Eyes: Pupils are equal, round, and reactive to light. EOM are normal.  Neck: Normal range of motion. Neck supple. No JVD present.  Cardiovascular: Normal rate and regular rhythm. Exam reveals no gallop and no friction rub.  No murmur heard. Pulmonary/Chest: No respiratory distress. He has no wheezes.  Abdominal: He exhibits no distension and no mass. There is no tenderness. There is no rebound and no guarding.  Musculoskeletal: Normal range of motion.  Neurological: He is alert and oriented to person, place, and time.  Distractible tremor  Skin: No rash noted. No pallor.  Psychiatric: He has a normal mood and affect. His behavior is normal.  Nursing note and vitals reviewed.    ED Treatments / Results  Labs (all labs ordered are listed, but only abnormal results are displayed) Labs Reviewed  CBG MONITORING, ED - Abnormal; Notable for the following components:      Result Value   Glucose-Capillary 111 (*)    All other components within normal limits    EKG EKG Interpretation  Date/Time:  Wednesday January 01 2018 17:26:09 EST Ventricular Rate:  95 PR Interval:    QRS Duration: 92 QT Interval:  362 QTC Calculation: 456 R Axis:   89 Text Interpretation:  Sinus rhythm Artifact in lead(s) I II III aVR aVL V1 Rate slower Otherwise no  significant change Confirmed by Melene Plan [phone]) on 01/01/2018 5:28:35 PM   Radiology No results found.  Procedures Procedures (including critical care time)  Medications Ordered in ED Medications  LORazepam (ATIVAN) injection 2 mg (has no administration in time range)  chlordiazePOXIDE (LIBRIUM) capsule 100 mg (has no administration in time range)     Initial Impression / Assessment and Plan / ED Course  I have reviewed the triage vital signs and the nursing notes.  Pertinent labs & imaging results that were available during my care of the patient were reviewed by me and considered in my medical decision making (see chart for details).     37 yo M here for benzo diazepam withdrawal symptoms.  Clinically he is well-appearing, nontoxic he is not hypertensive or tachycardic.  I will give a dose of Ativan here  dose of Librium sent him out on a Librium taper.  Discussed with him that we do not perform inpatient withdrawal care.  Given a list of resources.  6:02 PM:  I have discussed the diagnosis/risks/treatment options with the patient and family and believe the pt to be eligible for discharge home to follow-up with PCP. We also discussed returning to the ED immediately if new or worsening sx occur. We discussed the sx which are most concerning (e.g., sudden worsening pain, fever, inability to tolerate by mouth) that necessitate immediate return. Medications administered to the patient during their visit and any new prescriptions provided to the patient are listed below.  Medications given during this visit Medications  LORazepam (ATIVAN) injection 2 mg (has no administration in time range)  chlordiazePOXIDE (LIBRIUM) capsule 100 mg (has no administration in time range)     The patient appears reasonably screen and/or stabilized for discharge and I doubt any other medical condition or other [facility] requiring further screening, evaluation, or treatment in the ED at this time prior to  discharge.    Final Clinical Impressions(s) / ED Diagnoses   Final diagnoses:  Benzodiazepine withdrawal without complication [facility](HCC)    ED Discharge Orders    None       Melene PlanFloyd, Makayli Bracken, DO 01/01/18 1803

## 2018-01-01 NOTE — ED Triage Notes (Signed)
Per [facility], pt from home with complaint benzo withdrawal, last took "tomazapam 60mg  this morning around 0500 and 40mg  alprazolam last night, 80mg  atizolam yesterday morning" for anxiety disorder. Denies HI/SI. States "I think I may have had a seizure this morning" NO hx of seizures and could not describe seizure. VSS. Pt given 12.5g glucose IV in route for CBG 59 and 4mg  zofran. Axox4. Pt states "I've been buying my benzos off of the dark net but I am prescribed xanax"

## 2018-01-08 ENCOUNTER — Encounter (HOSPITAL_COMMUNITY): Payer: Self-pay

## 2018-01-08 ENCOUNTER — Ambulatory Visit (INDEPENDENT_AMBULATORY_CARE_PROVIDER_SITE_OTHER): Payer: Medicaid Other

## 2018-01-08 ENCOUNTER — Ambulatory Visit (HOSPITAL_COMMUNITY)
Admission: EM | Admit: 2018-01-08 | Discharge: 2018-01-08 | Disposition: A | Discharge status: Home / Self Care | Payer: Medicaid Other | Attending: Family Medicine | Admitting: Family Medicine

## 2018-01-08 DIAGNOSIS — S9031XA Contusion of right foot, initial encounter: Secondary | ICD-10-CM

## 2018-01-08 DIAGNOSIS — S92351A Displaced fracture of fifth metatarsal bone, right foot, initial encounter for closed fracture: Secondary | ICD-10-CM | POA: Diagnosis not present

## 2018-01-08 DIAGNOSIS — S61501A Unspecified open wound of right wrist, initial encounter: Secondary | ICD-10-CM | POA: Diagnosis not present

## 2018-01-08 DIAGNOSIS — W19XXXA Unspecified fall, initial encounter: Secondary | ICD-10-CM | POA: Diagnosis not present

## 2018-01-08 MED ORDER — SULFAMETHOXAZOLE-TRIMETHOPRIM 800-160 MG PO TABS: 1.0000 | ORAL_TABLET | Freq: Two times a day (BID) | ORAL | 0 refills | Status: AC

## 2018-01-08 NOTE — ED Triage Notes (Signed)
Pt presents with right foot pain and swelling from a fall.  Pt also presents with a small wound on right wrist that pt believes may have MRSA in it because of contact with girlfriend who had it.

## 2018-01-08 NOTE — ED Provider Notes (Signed)
[facility]   [id number] 01/08/18 Arrival Time: 1551  ASSESSMENT & PLAN:  1. Contusion of right foot, initial encounter   2. Closed displaced fracture of fifth metatarsal bone of right foot, initial encounter   3. Wrist wound, open, simple, right, initial encounter    I have personally viewed the imaging studies ordered this visit. Distal fifth metatarsal fracture.  Imaging: Dg Foot Complete Right Result Date: 01/08/2018 CLINICAL DATA:  Larey SeatFell 1 week ago. Persistent pain and swelling over the lateral aspect of the foot. EXAM: RIGHT FOOT COMPLETE - 3+ VIEW COMPARISON:  05/25/2014 FINDINGS: There is a mildly displaced oblique fracture involving the distal shaft and neck of the fifth metatarsal. The other metatarsal bones are intact. No other fractures are identified. IMPRESSION: Mildly displaced oblique fracture involving the distal shaft and neck of the fifth metatarsal. Electronically Signed   By: Rudie MeyerP.  Gallerani M.D.   On: 01/08/2018 17:10   For open wound on wrist: Meds ordered this encounter  Medications  . sulfamethoxazole-trimethoprim (BACTRIM DS,SEPTRA DS) 800-160 MG tablet    Sig: Take 1 tablet by mouth 2 (two) times daily for 10 days.    Dispense:  20 tablet    Refill:  0   Orders Placed This Encounter  Procedures  . DG Foot Complete Right  . Apply cam walker   Follow-up Information    Schedule an appointment as soon as possible for a visit  with August Saucerean, Corrie MckusickGregory Scott, MD.   Specialty:  Orthopedic Surgery Contact information: [address] [place] [address] [phone]          Rest the injured area as much as practical.  To wear cam walker until f/u with orthopaedist as above.  Reviewed expectations re: course of current medical issues. Questions answered. Outlined signs and symptoms indicating need for more acute intervention. Patient verbalized understanding. After Visit Summary given.  SUBJECTIVE: History from: patient. Jordan PilonBrian A  Chapman is a 37 y.o. male who reports persistent mild to moderate pain of his right foot; described as aching without radiation. Injury/trama: yes, reports fall yesterday; ambulatory but with discomfort. Symptoms have progressed to a point and plateaued since beginning. Relieved by: rest. Worsened by: certain movements and weight bearing. Associated symptoms: none reported. Extremity sensation changes or weakness: none. Self treatment: OTC analgesics with mild help. History of similar: no.  Also reports R wrist wound. Noticed 4 days ago. Slightly larger and more erythematous. No drainage. Minimal discomfort. Concerned because his girlfriend "has MRSA".  History reviewed. No pertinent surgical history.   ROS: As per HPI. All other systems negative.    OBJECTIVE:  Vitals:   01/08/18 1632  BP: 113/73  Pulse: 72  Resp: 18  Temp: 98 F (36.7 C)  TempSrc: Oral  SpO2: 99%    General appearance: alert; no distress HEENT: [place]; AT Neck: FROM without LAD Extremities: . R foot: warm and well perfused; poorly localized moderate tenderness over right lateral foot; without gross deformities; with mild swelling; with no bruising; ROM: normal but with discomfort . R wrist: approx 1cm area of crusted skin and mild erythema; no drainage CV: brisk extremity capillary refill of RUE and RLE; 2+ DP and PT pulse of RLE. Back: FROM at waist Skin: warm and dry except for area described above Neurologic: gait normal; normal reflexes of RUE and RLE; normal sensation of RUE and RLE; normal strength of RUE and RLE Psychological: alert and cooperative; normal mood and affect  Allergies  Allergen Reactions  .  Compazine [Prochlorperazine Edisylate] Other (See Comments)    Blood pressure drops  . Nsaids Other (See Comments)    unknown  . Phenergan [Promethazine Hcl] Other (See Comments)    Blood pressure drops   . Tramadol Other (See Comments)    Blood pressure drops     Past Medical History:    Diagnosis Date  . Anxiety   . Benzodiazepine abuse (HCC)   . Chronic tachycardia    Per Dr. Doreene Eland (psych)  . Depression   . Drug use   . History of IBS   . Polysubstance abuse (HCC)    Social History   Socioeconomic History  . Marital status: Single    Spouse name: Not on file  . Number of children: Not on file  . Years of education: Not on file  . Highest education level: Not on file  Occupational History  . Not on file  Social Needs  . Financial resource strain: Not on file  . Food insecurity:    Worry: Not on file    Inability: Not on file  . Transportation needs:    Medical: Not on file    Non-medical: Not on file  Tobacco Use  . Smoking status: Current Every Day Smoker    Packs/day: 0.50    Types: Cigarettes  . Smokeless tobacco: Never Used  Substance and Sexual Activity  . Alcohol use: No  . Drug use: Yes    Types: IV, Cocaine, Marijuana    Comment: last cocain 10/2017, last heroin 11/2017  . Sexual activity: Not on file  Lifestyle  . Physical activity:    Days per week: Not on file    Minutes per session: Not on file  . Stress: Not on file  Relationships  . Social connections:    Talks on phone: Not on file    Gets together: Not on file    Attends religious service: Not on file    Active member of club or organization: Not on file    Attends meetings of clubs or organizations: Not on file    Relationship status: Not on file  Other Topics Concern  . Not on file  Social History Narrative   n/a    History reviewed. No pertinent surgical history.    Mardella Layman, MD 01/27/18 [phone]

## 2018-01-08 NOTE — Discharge Instructions (Addendum)
You may use over the counter ibuprofen or acetaminophen as needed for discomfort. ° °

## 2018-01-10 ENCOUNTER — Encounter (INDEPENDENT_AMBULATORY_CARE_PROVIDER_SITE_OTHER): Payer: Self-pay | Admitting: Orthopedic Surgery

## 2018-01-10 ENCOUNTER — Ambulatory Visit (INDEPENDENT_AMBULATORY_CARE_PROVIDER_SITE_OTHER): Payer: Medicaid Other | Admitting: Orthopedic Surgery

## 2018-01-10 DIAGNOSIS — S99101D Unspecified physeal fracture of right metatarsal, subsequent encounter for fracture with routine healing: Secondary | ICD-10-CM

## 2018-01-10 MED ORDER — ACETAMINOPHEN-CODEINE #3 300-30 MG PO TABS: ORAL_TABLET | ORAL | 0 refills | Status: DC

## 2018-01-13 ENCOUNTER — Encounter (INDEPENDENT_AMBULATORY_CARE_PROVIDER_SITE_OTHER): Payer: Self-pay | Admitting: Orthopedic Surgery

## 2018-01-13 NOTE — Progress Notes (Signed)
Office Visit Note   Patient: [name]           Date of Birth: 05/29/1980           MRN: [id number] Visit Date: 01/10/2018 Requested by: No referring provider defined for this encounter. PCP: Patient, No Pcp Per  Subjective: Chief Complaint  Patient presents with  . Right Foot - Follow-up, Fracture    HPI: [name] is a patient with right foot pain.  Fell on 01/01/2018.  [facility] on 1120.  Advised he had 1/5 metatarsal fracture.  Has been ambulating in a fracture boot.  He is allergic to nonsteroidals.  He does take some psychiatric medications and he is a smoker.              ROS: All systems reviewed are negative as they relate to the chief complaint within the history of present illness.  Patient denies  fevers or chills.   Assessment & Plan: Visit Diagnoses:  1. Closed physeal fracture of fifth metatarsal bone of right foot with routine healing, unspecified physeal fracture configuration, subsequent encounter     Plan: Impression is right foot metatarsal neck fracture with mild tenderness and only mild displacement.  Plan is continue weightbearing as tolerated in the fracture boot for 4 weeks.  I think it may take a little bit longer for this fracture to heal in him because he is a smoker.  Tylenol prescription written for Tylenol 3 1 every 12 hours #20 with no refills.  Come back in 4 weeks.  Follow-Up Instructions: Return in about 4 weeks (around 02/07/2018).   Orders:  No orders of the defined types were placed in this encounter.  Meds ordered this encounter  Medications  . acetaminophen-codeine (TYLENOL #3) 300-30 MG tablet    Sig: 1 po q 12hrs prn pain    Dispense:  20 tablet    Refill:  0      Procedures: No procedures performed   Clinical Data: No additional findings.  Objective: Vital Signs: There were no vitals taken for this visit.  Physical Exam:   Constitutional: Patient appears well-developed HEENT:  Head:  Normocephalic Eyes:EOM are normal Neck: Normal range of motion Cardiovascular: Normal rate Pulmonary/chest: Effort normal Neurologic: Patient is alert Skin: Skin is warm Psychiatric: Patient has normal mood and affect    Ortho Exam: Ortho exam demonstrates palpable pedal pulses of the right foot with mild tenderness at the distal metatarsal #5.  No toe deformity is noted.  No real tenting of the skin around the fracture site.  Foot is perfused and sensate.  Specialty Comments:  No specialty comments available.  Imaging: No results found.   PMFS History: Patient Active Problem List   Diagnosis Date Noted  . Opioid abuse with opioid-induced mood disorder (HCC) 10/06/2017  . Depression   . PTSD (post-traumatic stress disorder)   . Tachycardia   . Acute blood loss anemia   . Weakness 09/12/2015  . Respiratory failure, acute (HCC)   . Respiratory failure (HCC)   . Atelectasis   . Altered mental status 09/03/2015  . Opioid overdose (HCC) 08/29/2015  . Leukocytosis 08/29/2015  . Anemia 08/29/2015  . Polysubstance abuse (HCC) 08/29/2015  . Tobacco abuse 08/29/2015   Past Medical History:  Diagnosis Date  . Anxiety   . Benzodiazepine abuse (HCC)   . Chronic tachycardia    Per Dr. Doreene ElandHendrix (psych)  . Depression   . Drug use   . History of  IBS   . Polysubstance abuse (HCC)     History reviewed. No pertinent family history.  History reviewed. No pertinent surgical history. Social History   Occupational History  . Not on file  Tobacco Use  . Smoking status: Current Every Day Smoker    Packs/day: 0.50    Types: Cigarettes  . Smokeless tobacco: Never Used  Substance and Sexual Activity  . Alcohol use: No  . Drug use: Yes    Types: IV, Cocaine, Marijuana    Comment: last cocain 10/2017, last heroin 11/2017  . Sexual activity: Not on file

## 2018-02-04 ENCOUNTER — Ambulatory Visit (INDEPENDENT_AMBULATORY_CARE_PROVIDER_SITE_OTHER): Payer: Medicaid Other | Admitting: Orthopedic Surgery

## 2018-02-10 ENCOUNTER — Ambulatory Visit (INDEPENDENT_AMBULATORY_CARE_PROVIDER_SITE_OTHER): Payer: Medicaid Other | Admitting: Orthopedic Surgery

## 2018-02-17 ENCOUNTER — Ambulatory Visit (INDEPENDENT_AMBULATORY_CARE_PROVIDER_SITE_OTHER): Payer: Medicaid Other | Admitting: Orthopedic Surgery

## 2018-09-07 ENCOUNTER — Emergency Department (HOSPITAL_COMMUNITY)
Admission: EM | Admit: 2018-09-07 | Discharge: 2018-09-07 | Disposition: A | Discharge status: Home / Self Care | Payer: Medicaid Other | Attending: Emergency Medicine | Admitting: Emergency Medicine

## 2018-09-07 ENCOUNTER — Other Ambulatory Visit: Payer: Self-pay

## 2018-09-07 DIAGNOSIS — R1084 Generalized abdominal pain: Secondary | ICD-10-CM | POA: Diagnosis not present

## 2018-09-07 DIAGNOSIS — Z79899 Other long term (current) drug therapy: Secondary | ICD-10-CM | POA: Diagnosis not present

## 2018-09-07 DIAGNOSIS — R42 Dizziness and giddiness: Secondary | ICD-10-CM | POA: Diagnosis not present

## 2018-09-07 DIAGNOSIS — F17210 Nicotine dependence, cigarettes, uncomplicated: Secondary | ICD-10-CM | POA: Insufficient documentation

## 2018-09-07 DIAGNOSIS — R112 Nausea with vomiting, unspecified: Secondary | ICD-10-CM | POA: Diagnosis not present

## 2018-09-07 LAB — COMPREHENSIVE METABOLIC PANEL
ALT: 21 U/L (ref 0–44)
AST: 18 U/L (ref 15–41)
Albumin: 4.1 g/dL (ref 3.5–5.0)
Alkaline Phosphatase: 108 U/L (ref 38–126)
Anion gap: 10 (ref 5–15)
BUN: 6 mg/dL (ref 6–20)
CO2: 25 mmol/L (ref 22–32)
Calcium: 8.9 mg/dL (ref 8.9–10.3)
Chloride: 102 mmol/L (ref 98–111)
Creatinine, Ser: 0.89 mg/dL (ref 0.61–1.24)
GFR calc Af Amer: >60 mL/min (ref >60)
GFR calc non Af Amer: >60 mL/min (ref >60)
Glucose, Bld: 102 mg/dL — ABNORMAL HIGH (ref 70–99)
Potassium: 3.4 mmol/L — ABNORMAL LOW (ref 3.5–5.1)
Sodium: 137 mmol/L (ref 135–145)
Total Bilirubin: 0.7 mg/dL (ref 0.3–1.2)
Total Protein: 7.4 g/dL (ref 6.5–8.1)

## 2018-09-07 LAB — CBC
HCT: 46.7 % (ref 39.0–52.0)
Hemoglobin: 15.2 g/dL (ref 13.0–17.0)
MCH: 29.7 pg (ref 26.0–34.0)
MCHC: 32.5 g/dL (ref 30.0–36.0)
MCV: 91.4 fL (ref 80.0–100.0)
Platelets: 247 10*3/uL (ref 150–400)
RBC: 5.11 MIL/uL (ref 4.22–5.81)
RDW: 12.2 % (ref 11.5–15.5)
WBC: 6.4 10*3/uL (ref 4.0–10.5)
nRBC: 0 % (ref 0.0–0.2)

## 2018-09-07 LAB — URINALYSIS, ROUTINE W REFLEX MICROSCOPIC
Bilirubin Urine: NEGATIVE
Glucose, UA: NEGATIVE mg/dL
Hgb urine dipstick: NEGATIVE
Ketones, ur: 20 mg/dL — AB
Leukocytes,Ua: NEGATIVE
Nitrite: NEGATIVE
Protein, ur: NEGATIVE mg/dL
Specific Gravity, Urine: 1.013 (ref 1.005–1.030)
pH: 6 (ref 5.0–8.0)

## 2018-09-07 LAB — LIPASE, BLOOD: Lipase: 32 U/L (ref 11–51)

## 2018-09-07 MED ORDER — ONDANSETRON HCL 4 MG/2ML IJ SOLN
4.0000 mg | Freq: Once | INTRAMUSCULAR | Status: AC
  Administered 2018-09-07: 4 mg via INTRAVENOUS
  Filled 2018-09-07: qty 2

## 2018-09-07 MED ORDER — DICYCLOMINE HCL 10 MG PO CAPS
10.0000 mg | ORAL_CAPSULE | Freq: Once | ORAL | Status: AC
  Administered 2018-09-07: 10 mg via ORAL
  Filled 2018-09-07: qty 1

## 2018-09-07 MED ORDER — ONDANSETRON 4 MG PO TBDP: 4.0000 mg | ORAL_TABLET | Freq: Three times a day (TID) | ORAL | 0 refills | Status: AC | PRN

## 2018-09-07 MED ORDER — SODIUM CHLORIDE 0.9 % IV BOLUS
1000.0000 mL | Freq: Once | INTRAVENOUS | Status: AC
  Administered 2018-09-07: 1000 mL via INTRAVENOUS

## 2018-09-07 MED ORDER — LORAZEPAM 2 MG/ML IJ SOLN
1.0000 mg | Freq: Once | INTRAMUSCULAR | Status: AC
  Administered 2018-09-07: 1 mg via INTRAVENOUS
  Filled 2018-09-07: qty 1

## 2018-09-07 NOTE — ED Provider Notes (Signed)
[place] EMERGENCY DEPARTMENT Provider Note   CSN: [id number] Arrival date & time: 09/07/18  1802    History   Chief Complaint Chief Complaint  Patient presents with  . Emesis  . Dizziness  . Abdominal Pain    HPI [name] is a 37 y.o. male.     The history is provided by the patient and medical records. No language interpreter was used.  Emesis Associated symptoms: abdominal pain   Dizziness Associated symptoms: nausea and vomiting   Abdominal Pain Associated symptoms: nausea and vomiting    [name] is a 38 y.o. male  with a PMH as listed below who presents to the Emergency Department complaining of generalized abdominal pain, nausea and vomiting which began 2 to 3 days ago.  Feels similar to his cyclical vomiting syndrome he has had in the past.  He has tried his Zofran at home with little improvement.  Fever or chills.  No chest pain, shortness of breath or back pain.  Denies dysuria.  Past Medical History:  Diagnosis Date  . Anxiety   . Benzodiazepine abuse ([place])   . Chronic tachycardia    Per Dr. Wallene Huh (psych)  . Depression   . Drug use   . History of IBS   . Polysubstance abuse [facility])     Patient Active Problem List   Diagnosis Date Noted  . Opioid abuse with opioid-induced mood disorder ([place]) 10/06/2017  . Depression   . PTSD (post-traumatic stress disorder)   . Tachycardia   . Acute blood loss anemia   . Weakness 09/12/2015  . Respiratory failure, acute ([place])   . Respiratory failure ([place])   . Atelectasis   . Altered mental status 09/03/2015  . Opioid overdose ([place]) 08/29/2015  . Leukocytosis 08/29/2015  . Anemia 08/29/2015  . Polysubstance abuse ([place]) 08/29/2015  . Tobacco abuse 08/29/2015    No past surgical history on file.      Home Medications    Prior to Admission medications   Medication Sig Start Date End Date Taking? Authorizing Provider  acetaminophen-codeine (TYLENOL #3) 300-30 MG tablet  1 po q 12hrs prn pain 01/10/18   Meredith Pel, MD  albuterol (PROVENTIL HFA;VENTOLIN HFA) 108 (90 Base) MCG/ACT inhaler Inhale 1-2 puffs into the lungs every 6 (six) hours as needed for wheezing or shortness of breath. 11/18/17   Zigmund Gottron, NP  ALPRAZolam Duanne Moron) 1 MG tablet Take 1 mg by mouth 3 (three) times daily as needed for anxiety.  07/02/17   [provider]  Buprenorphine HCl-Naloxone HCl (SUBOXONE) 8-2 MG FILM Place 1 Film under the tongue at bedtime. 09/13/15   Elwin Mocha, MD  Buprenorphine HCl-Naloxone HCl (SUBOXONE) 8-2 MG FILM Place 20 mg of opioid under the tongue daily.    [provider]  fluvoxaMINE (LUVOX) 50 MG tablet Take 50 mg by mouth at bedtime.  07/31/17   [provider]  ondansetron (ZOFRAN ODT) 4 MG disintegrating tablet Take 1 tablet (4 mg total) by mouth every 8 (eight) hours as needed for nausea or vomiting. 09/07/18   Ward, Ozella Almond, PA-C  polyethylene glycol (MIRALAX / GLYCOLAX) packet Take 17 g by mouth at bedtime.    [provider]  SAPHRIS 10 MG SUBL Place 10 mg under the tongue at bedtime.  07/02/17   [provider]    Family History No family history on file.  Social History Social History   Tobacco Use  . Smoking  status: Current Every Day Smoker    Packs/day: 0.50    Types: Cigarettes  . Smokeless tobacco: Never Used  Substance Use Topics  . Alcohol use: No  . Drug use: Yes    Types: IV, Cocaine, Marijuana    Comment: last cocain 10/2017, last heroin 11/2017     Allergies   Compazine [prochlorperazine edisylate], Nsaids, Phenergan [promethazine hcl], and Tramadol   Review of Systems Review of Systems  Gastrointestinal: Positive for abdominal pain, nausea and vomiting.  All other systems reviewed and are negative.    Physical Exam Updated Vital Signs BP 104/65   Pulse 60   Temp 97.7 F (36.5 C) (Oral)   Resp 17   SpO2 97%   Physical Exam Vitals signs and nursing  note reviewed.  Constitutional:      General: He is not in acute distress.    Appearance: He is well-developed.  HENT:     Head: Normocephalic and atraumatic.     Mouth/Throat:     Comments: Tacky mucous membranes. Neck:     Musculoskeletal: Neck supple.  Cardiovascular:     Rate and Rhythm: Normal rate and regular rhythm.     Heart sounds: Normal heart sounds. No murmur.  Pulmonary:     Effort: Pulmonary effort is normal. No respiratory distress.     Breath sounds: Normal breath sounds.  Abdominal:     General: There is no distension.     Palpations: Abdomen is soft.     Comments: Generalized abdominal tenderness without rebound or guarding.  Skin:    General: Skin is warm and dry.  Neurological:     Mental Status: He is alert and oriented to person, place, and time.      ED Treatments / Results  Labs (all labs ordered are listed, but only abnormal results are displayed) Labs Reviewed  COMPREHENSIVE METABOLIC PANEL - Abnormal; Notable for the following components:      Result Value   Potassium 3.4 (*)    Glucose, Bld 102 (*)    All other components within normal limits  URINALYSIS, ROUTINE W REFLEX MICROSCOPIC - Abnormal; Notable for the following components:   Ketones, ur 20 (*)    All other components within normal limits  LIPASE, BLOOD  CBC    EKG None  Radiology No results found.  Procedures Procedures (including critical care time)  Medications Ordered in ED Medications  sodium chloride 0.9 % bolus 1,000 mL (0 mLs Intravenous Stopped 09/07/18 2006)  LORazepam (ATIVAN) injection 1 mg (1 mg Intravenous Given 09/07/18 1906)  ondansetron (ZOFRAN) injection 4 mg (4 mg Intravenous Given 09/07/18 1906)  sodium chloride 0.9 % bolus 1,000 mL (0 mLs Intravenous Stopped 09/07/18 2007)  dicyclomine (BENTYL) capsule 10 mg (10 mg Oral Given 09/07/18 2121)     Initial Impression / Assessment and Plan / ED Course  I have reviewed the triage vital signs and the nursing  notes.  Pertinent labs & imaging results that were available during my care of the patient were reviewed by me and considered in my medical decision making (see chart for details).       [name] is a 38 y.o. male who presents to ED for generalized abdominal pain, nausea or vomiting.  He feels that his symptoms are consistent with his usual cyclical vomiting syndrome.  Certainly possible that this is etiology of his symptoms today. ?  If he actually could be going through benzodiazepine withdrawal.  Will obtain labs, urine and  treat symptomatically for now.   Labs / urine reviewed and reassuring.  Patient re-evaluated and feels improved. He has had no vomiting while in the emergency department. Evaluation does not show pathology that would require ongoing emergent intervention or inpatient treatment. Zofran PRN nausea. PCP follow up. Return precautions discussed and all questions answered.    Final Clinical Impressions(s) / ED Diagnoses   Final diagnoses:  Non-intractable vomiting with nausea, unspecified vomiting type    ED Discharge Orders         Ordered    ondansetron (ZOFRAN ODT) 4 MG disintegrating tablet  Every 8 hours PRN     09/07/18 2209           Ward, Chase PicketJaime Pilcher, PA-C 09/07/18 2220    Blane OharaZavitz, Joshua, MD 09/07/18 2329

## 2018-09-07 NOTE — ED Notes (Signed)
Patient verbalizes understanding of discharge instructions. Opportunity for questioning and answers were provided. Armband removed by staff, pt discharged from ED.  

## 2018-09-07 NOTE — ED Notes (Signed)
Pt ingested PO fluids and meds without difficulty or complaint. No N/V.

## 2018-09-07 NOTE — Discharge Instructions (Addendum)
It was my pleasure taking care of you today!  Follow up with your doctor.   Zofran as needed for nausea.   Return to ER for new or worsening symptoms, any additional concerns.

## 2018-09-07 NOTE — ED Triage Notes (Signed)
Pt here via EMS for abdominal pain, N/V, and dizziness for two days. Hx IBS and cyclic vomiting syndrome. Last vomit was a few hours ago. Decreased PO intake for two days. 600 ml NS and 4 mg zofran given PTA.

## 2018-10-21 DIAGNOSIS — L03119 Cellulitis of unspecified part of limb: Secondary | ICD-10-CM | POA: Insufficient documentation

## 2018-11-22 ENCOUNTER — Encounter (HOSPITAL_COMMUNITY): Payer: Self-pay | Admitting: Emergency Medicine

## 2018-11-22 ENCOUNTER — Emergency Department (HOSPITAL_COMMUNITY)
Admission: EM | Admit: 2018-11-22 | Discharge: 2018-11-23 | Disposition: A | Discharge status: Home / Self Care | Payer: Medicaid Other | Attending: Emergency Medicine | Admitting: Emergency Medicine

## 2018-11-22 ENCOUNTER — Other Ambulatory Visit: Payer: Self-pay

## 2018-11-22 DIAGNOSIS — F17210 Nicotine dependence, cigarettes, uncomplicated: Secondary | ICD-10-CM | POA: Insufficient documentation

## 2018-11-22 DIAGNOSIS — L02414 Cutaneous abscess of left upper limb: Secondary | ICD-10-CM | POA: Diagnosis not present

## 2018-11-22 DIAGNOSIS — L03114 Cellulitis of left upper limb: Secondary | ICD-10-CM | POA: Diagnosis not present

## 2018-11-22 DIAGNOSIS — Z79899 Other long term (current) drug therapy: Secondary | ICD-10-CM | POA: Insufficient documentation

## 2018-11-22 DIAGNOSIS — L0291 Cutaneous abscess, unspecified: Secondary | ICD-10-CM

## 2018-11-22 DIAGNOSIS — M79642 Pain in left hand: Secondary | ICD-10-CM | POA: Diagnosis present

## 2018-11-22 LAB — CBC WITH DIFFERENTIAL/PLATELET
Abs Immature Granulocytes: 0.05 10*3/uL (ref 0.00–0.07)
Basophils Absolute: 0 10*3/uL (ref 0.0–0.1)
Basophils Relative: 0 %
Eosinophils Absolute: 0.2 10*3/uL (ref 0.0–0.5)
Eosinophils Relative: 2 %
HCT: 39.3 % (ref 39.0–52.0)
Hemoglobin: 12.4 g/dL — ABNORMAL LOW (ref 13.0–17.0)
Immature Granulocytes: 1 %
Lymphocytes Relative: 18 %
Lymphs Abs: 1.5 10*3/uL (ref 0.7–4.0)
MCH: 30.2 pg (ref 26.0–34.0)
MCHC: 31.6 g/dL (ref 30.0–36.0)
MCV: 95.6 fL (ref 80.0–100.0)
Monocytes Absolute: 0.6 10*3/uL (ref 0.1–1.0)
Monocytes Relative: 7 %
Neutro Abs: 6 10*3/uL (ref 1.7–7.7)
Neutrophils Relative %: 72 %
Platelets: 341 10*3/uL (ref 150–400)
RBC: 4.11 MIL/uL — ABNORMAL LOW (ref 4.22–5.81)
RDW: 13.7 % (ref 11.5–15.5)
WBC: 8.4 10*3/uL (ref 4.0–10.5)
nRBC: 0 % (ref 0.0–0.2)

## 2018-11-22 LAB — COMPREHENSIVE METABOLIC PANEL
ALT: 21 U/L (ref 0–44)
AST: 28 U/L (ref 15–41)
Albumin: 4.2 g/dL (ref 3.5–5.0)
Alkaline Phosphatase: 104 U/L (ref 38–126)
Anion gap: 10 (ref 5–15)
BUN: 15 mg/dL (ref 6–20)
CO2: 30 mmol/L (ref 22–32)
Calcium: 8.9 mg/dL (ref 8.9–10.3)
Chloride: 100 mmol/L (ref 98–111)
Creatinine, Ser: 1.29 mg/dL — ABNORMAL HIGH (ref 0.61–1.24)
GFR calc Af Amer: >60 mL/min (ref >60)
GFR calc non Af Amer: >60 mL/min (ref >60)
Glucose, Bld: 111 mg/dL — ABNORMAL HIGH (ref 70–99)
Potassium: 4.1 mmol/L (ref 3.5–5.1)
Sodium: 140 mmol/L (ref 135–145)
Total Bilirubin: 1.1 mg/dL (ref 0.3–1.2)
Total Protein: 7.5 g/dL (ref 6.5–8.1)

## 2018-11-22 LAB — LACTIC ACID, PLASMA: Lactic Acid, Venous: 1.1 mmol/L (ref 0.5–1.9)

## 2018-11-22 MED ORDER — SODIUM CHLORIDE 0.9% FLUSH: 3.0000 mL | Freq: Once | INTRAVENOUS | Status: DC

## 2018-11-22 NOTE — ED Triage Notes (Addendum)
C/o infection to L hand and L AC area x 1 week with knot.  Denies known cause.  Reports chills.  Denies fever.  States he believes [facility] area is due to where labs were drawn last time he was at hospital.

## 2018-11-23 MED ORDER — LIDOCAINE-EPINEPHRINE (PF) 2 %-1:200000 IJ SOLN
10.0000 mL | Freq: Once | INTRAMUSCULAR | Status: AC
  Administered 2018-11-23: 10 mL
  Filled 2018-11-23: qty 20

## 2018-11-23 MED ORDER — DOXYCYCLINE HYCLATE 100 MG PO CAPS: 100.0000 mg | ORAL_CAPSULE | Freq: Two times a day (BID) | ORAL | 0 refills | Status: DC

## 2018-11-23 MED ORDER — DOXYCYCLINE HYCLATE 100 MG PO TABS
100.0000 mg | ORAL_TABLET | Freq: Once | ORAL | Status: AC
  Administered 2018-11-23: 04:00:00 100 mg via ORAL
  Filled 2018-11-23: qty 1

## 2018-11-23 NOTE — ED Notes (Signed)
Patient verbalizes understanding of discharge instructions. Opportunity for questioning and answers were provided. Armband removed by staff, pt discharged from ED.  

## 2018-11-23 NOTE — ED Provider Notes (Signed)
[facility] EMERGENCY DEPARTMENT Provider Note   CSN: [id number] Arrival date & time: 11/22/18  2100     History   Chief Complaint Chief Complaint  Patient presents with  . hand infection    HPI [name] is a 38 y.o. male.     Patient with past medical history notable for polysubstance abuse, depression, anxiety, prior suicide attempt, presents to the emergency department with a chief complaint of left hand and left elbow pain.  He states that approximately 1 week ago he had some swelling and redness on his left hand and left elbow.  He states that swelling has been gradually worsening.  He states that he was able to express some pus from these locations.  He denies any fevers or chills.  Denies any associated vomiting.  Denies any treatments.  He denies recent IV drug use.  He states that he had an IV placed at the location a couple of months ago, but the swelling only began a week ago.  The history is provided by the patient. No language interpreter was used.    Past Medical History:  Diagnosis Date  . Anxiety   . Benzodiazepine abuse (HCC)   . Chronic tachycardia    Per Dr. Doreene Eland (psych)  . Depression   . Drug use   . History of IBS   . Polysubstance abuse [facility])     Patient Active Problem List   Diagnosis Date Noted  . Opioid abuse with opioid-induced mood disorder (HCC) 10/06/2017  . Depression   . PTSD (post-traumatic stress disorder)   . Tachycardia   . Acute blood loss anemia   . Weakness 09/12/2015  . Respiratory failure, acute (HCC)   . Respiratory failure (HCC)   . Atelectasis   . Altered mental status 09/03/2015  . Opioid overdose (HCC) 08/29/2015  . Leukocytosis 08/29/2015  . Anemia 08/29/2015  . Polysubstance abuse (HCC) 08/29/2015  . Tobacco abuse 08/29/2015    History reviewed. No pertinent surgical history.      Home Medications    Prior to Admission medications   Medication Sig Start Date End Date Taking?  Authorizing Provider  acetaminophen-codeine (TYLENOL #3) 300-30 MG tablet 1 po q 12hrs prn pain 01/10/18   Cammy Copa, MD  albuterol (PROVENTIL HFA;VENTOLIN HFA) 108 (90 Base) MCG/ACT inhaler Inhale 1-2 puffs into the lungs every 6 (six) hours as needed for wheezing or shortness of breath. 11/18/17   Georgetta Haber, NP  ALPRAZolam Prudy Feeler) 1 MG tablet Take 1 mg by mouth 3 (three) times daily as needed for anxiety.  07/02/17   [provider]  Buprenorphine HCl-Naloxone HCl (SUBOXONE) 8-2 MG FILM Place 1 Film under the tongue at bedtime. 09/13/15   Haydee Salter, MD  Buprenorphine HCl-Naloxone HCl (SUBOXONE) 8-2 MG FILM Place 20 mg of opioid under the tongue daily.    [provider]  fluvoxaMINE (LUVOX) 50 MG tablet Take 50 mg by mouth at bedtime.  07/31/17   [provider]  ondansetron (ZOFRAN ODT) 4 MG disintegrating tablet Take 1 tablet (4 mg total) by mouth every 8 (eight) hours as needed for nausea or vomiting. 09/07/18   Ward, Chase Picket, PA-C  polyethylene glycol (MIRALAX / GLYCOLAX) packet Take 17 g by mouth at bedtime.    [provider]  SAPHRIS 10 MG SUBL Place 10 mg under the tongue at bedtime.  07/02/17   [provider]    Family History No family history on  file.  Social History Social History   Tobacco Use  . Smoking status: Current Every Day Smoker    Packs/day: 0.50    Types: Cigarettes  . Smokeless tobacco: Never Used  Substance Use Topics  . Alcohol use: No  . Drug use: Yes    Types: IV, Cocaine, Marijuana     Allergies   Compazine [prochlorperazine edisylate], Nsaids, Phenergan [promethazine hcl], and Tramadol   Review of Systems Review of Systems  All other systems reviewed and are negative.    Physical Exam Updated Vital Signs BP 124/76 (BP Location: Right Arm)   Pulse (!) 113   Temp 98.3 F (36.8 C) (Oral)   Resp 16   SpO2 99%   Physical Exam Vitals signs and nursing note reviewed.   Constitutional:      Appearance: He is well-developed.  HENT:     Head: Normocephalic and atraumatic.  Eyes:     Conjunctiva/sclera: Conjunctivae normal.  Neck:     Musculoskeletal: Neck supple.  Cardiovascular:     Rate and Rhythm: Normal rate and regular rhythm.     Heart sounds: No murmur.  Pulmonary:     Effort: Pulmonary effort is normal. No respiratory distress.     Breath sounds: Normal breath sounds.  Abdominal:     Palpations: Abdomen is soft.     Tenderness: There is no abdominal tenderness.  Musculoskeletal: Normal range of motion.  Skin:    General: Skin is warm and dry.     Comments: Cellulitis to posterior left hand Swollen/fluctuant mass left [facility]C  Neurological:     Mental Status: He is alert and oriented to person, place, and time.  Psychiatric:        Mood and Affect: Mood normal.        Behavior: Behavior normal.      ED Treatments / Results  Labs (all labs ordered are listed, but only abnormal results are displayed) Labs Reviewed  COMPREHENSIVE METABOLIC PANEL - Abnormal; Notable for the following components:      Result Value   Glucose, Bld 111 (*)        Creatinine, Ser 1.29 (*)    All other components within normal limits  CBC WITH DIFFERENTIAL/PLATELET - Abnormal; Notable for the following components:   RBC 4.11 (*)    Hemoglobin 12.4 (*)    All other components within normal limits  LACTIC ACID, PLASMA  LACTIC ACID, PLASMA    EKG None  Radiology No results found.  Procedures Ultrasound ED Soft Tissue  Date/Time: 11/23/2018 3:55 AM Performed by: Roxy HorsemanBrowning, Taleeyah Bora, PA-C Authorized by: Roxy HorsemanBrowning, Sonny Anthes, PA-C   Procedure details:    Indications: evaluate for cellulitis     Transverse view:  Visualized   Longitudinal view:  Visualized   Images: archived   Location:    Location: upper extremity     Side:  Left Findings:     no abscess present    cellulitis present    no foreign body present Ultrasound ED Soft Tissue   Date/Time: 11/23/2018 3:55 AM Performed by: Roxy HorsemanBrowning, Dionte Blaustein, PA-C Authorized by: Roxy HorsemanBrowning, Fabio Wah, PA-C   Procedure details:    Indications: localization of abscess     Transverse view:  Visualized   Longitudinal view:  Visualized   Images: archived     Limitations:  Positioning Location:    Location: upper extremity     Side:  Left Findings:     abscess present    no cellulitis present    no  foreign body present .Marland KitchenIncision and Drainage  Date/Time: 11/23/2018 4:53 AM Performed by: Montine Circle, PA-C Authorized by: Montine Circle, PA-C   Consent:    Consent obtained:  Verbal   Consent given by:  Patient   Risks discussed:  Bleeding, incomplete drainage, pain and damage to other organs   Alternatives discussed:  No treatment Universal protocol:    Procedure explained and questions answered to patient or proxy's satisfaction: yes     Relevant documents present and verified: yes     Test results available and properly labeled: yes     Imaging studies available: yes     Required blood products, implants, devices, and special equipment available: yes     Site/side marked: yes     Immediately prior to procedure a time out was called: yes     Patient identity confirmed:  Verbally with patient Location:    Type:  Abscess Pre-procedure details:    Skin preparation:  Chloraprep Anesthesia (see MAR for exact dosages):    Anesthesia method:  Local infiltration   Local anesthetic:  Lidocaine 1% WITH epi Procedure type:    Complexity:  Simple Procedure details:    Needle aspiration: yes     Needle size:  18 G   Drainage:  Purulent   Drainage amount:  Moderate Post-procedure details:    Patient tolerance of procedure:  Tolerated well, no immediate complications    Medications Ordered in ED Medications  sodium chloride flush (NS) 0.9 % injection 3 mL (has no administration in time range)     Initial Impression / Assessment and Plan / ED Course  I have reviewed the  triage vital signs and the nursing notes.  Pertinent labs & imaging results that were available during my care of the patient were reviewed by me and considered in my medical decision making (see chart for details).       Patient with concern for infection in his left AC and left hand.  He is a known IV drug user.  He has cellulitis of the posterior left hand, without evidence of abscess.  Cobblestoning seen on ultrasound without fluid collection.  There is a semi-fluctuant 1 x 2 cm mass/nodule in the left AC.  Ultrasound does show some fluid within, however it does not appear erythematous or necessarily like an abscess from general inspection.  I will aspirate it and see what comes out.  There was about 1.5 cc of pus that was aspirated.  Ultrasound reapplied, no more fluid collection was visualized.  Will treat with doxycycline.  Recommend follow-up with urgent care or return to the emergency department if worsening.   Final Clinical Impressions(s) / ED Diagnoses   Final diagnoses:  Cellulitis of left upper extremity  Abscess    ED Discharge Orders         Ordered    doxycycline (VIBRAMYCIN) 100 MG capsule  2 times daily     11/23/18 0452           Montine Circle, PA-C 11/23/18 0457    Ezequiel Essex, MD 11/24/18 [phone]

## 2018-12-11 ENCOUNTER — Telehealth: Payer: Medicaid Other | Admitting: Physician Assistant

## 2018-12-11 DIAGNOSIS — L0291 Cutaneous abscess, unspecified: Secondary | ICD-10-CM

## 2018-12-11 DIAGNOSIS — L0390 Cellulitis, unspecified: Secondary | ICD-10-CM

## 2018-12-11 NOTE — Progress Notes (Signed)
Based on what you shared with me, I feel your condition warrants further evaluation and I recommend that you be seen for a face to face office visit. Giving failure of previous treatments with worsening symptoms, you need to be seen ASAP. I would recommend ER evaluation for this to make sure IV antibiotics are not needed.   NOTE: If you entered your credit card information for this eVisit, you will not be charged. You may see a "hold" on your card for the $35 but that hold will drop off and you will not have a charge processed.  If you are having a true medical emergency please call 911.     For an urgent face to face visit, [place] has four urgent care centers for your convenience:   . [facility]    [phone]                  Get Driving Directions  [address], [place] [address] . 10 am to 8 pm Monday-Friday . 12 pm to 8 pm Saturday-Sunday   . [facility] at [place]                  Get Driving Directions  [address], [place], [place] [address] . 8 am to 8 pm Monday-Friday . 9 am to 6 pm Saturday . 11 am to 6 pm Sunday   . [facility] at [place]                  Get Driving Directions   [address] [place], [place] [address] . 8 am to 8 pm Monday-Friday . 8 am to 4 pm Saturday-Sunday    . [facility] at [place]                    Get Driving Directions  [phone]  [address], [place], [place] [address]  . Monday-Friday, 12 PM to 6 PM    Your e-visit answers were reviewed by a board certified advanced clinical practitioner to complete your personal care plan.  Thank you for using e-Visits.

## 2018-12-12 ENCOUNTER — Other Ambulatory Visit: Payer: Self-pay

## 2018-12-12 ENCOUNTER — Ambulatory Visit (HOSPITAL_COMMUNITY)
Admission: EM | Admit: 2018-12-12 | Discharge: 2018-12-12 | Disposition: A | Discharge status: Home / Self Care | Payer: Medicaid Other

## 2018-12-12 ENCOUNTER — Encounter (HOSPITAL_COMMUNITY): Payer: Self-pay

## 2018-12-12 ENCOUNTER — Encounter (HOSPITAL_COMMUNITY): Payer: Self-pay | Admitting: Emergency Medicine

## 2018-12-12 ENCOUNTER — Emergency Department (HOSPITAL_COMMUNITY)
Admission: EM | Admit: 2018-12-12 | Discharge: 2018-12-13 | Disposition: A | Discharge status: Home / Self Care | Payer: Medicaid Other | Attending: Emergency Medicine | Admitting: Emergency Medicine

## 2018-12-12 ENCOUNTER — Telehealth: Payer: Medicaid Other

## 2018-12-12 DIAGNOSIS — Z79899 Other long term (current) drug therapy: Secondary | ICD-10-CM | POA: Diagnosis not present

## 2018-12-12 DIAGNOSIS — L02512 Cutaneous abscess of left hand: Secondary | ICD-10-CM

## 2018-12-12 DIAGNOSIS — L03114 Cellulitis of left upper limb: Secondary | ICD-10-CM | POA: Diagnosis not present

## 2018-12-12 DIAGNOSIS — L03119 Cellulitis of unspecified part of limb: Secondary | ICD-10-CM | POA: Diagnosis not present

## 2018-12-12 DIAGNOSIS — F17210 Nicotine dependence, cigarettes, uncomplicated: Secondary | ICD-10-CM | POA: Insufficient documentation

## 2018-12-12 DIAGNOSIS — L02413 Cutaneous abscess of right upper limb: Secondary | ICD-10-CM

## 2018-12-12 DIAGNOSIS — L02414 Cutaneous abscess of left upper limb: Secondary | ICD-10-CM

## 2018-12-12 MED ORDER — SODIUM CHLORIDE 0.9 % IN NEBU
INHALATION_SOLUTION | RESPIRATORY_TRACT | Status: AC
  Filled 2018-12-12: qty 3

## 2018-12-12 NOTE — Discharge Instructions (Addendum)
Go to the emergency department for evaluation.     

## 2018-12-12 NOTE — ED Notes (Signed)
Patient is being discharged from the Urgent [facility] and sent to the Emergency Department via personal vehicle by self. Per provider Barkley Boards, patient is stable but in need of higher level of care due to needing IV antibiotics. Patient is aware and verbalizes understanding of plan of care.   Vitals:   12/12/18 2004  BP: 136/84  Pulse: 94  Resp: 17  Temp: 99.4 F (37.4 C)  SpO2: 100%

## 2018-12-12 NOTE — ED Provider Notes (Signed)
[place]    CSN: [id number] Arrival date & time: 12/12/18  1947      History   Chief Complaint Chief Complaint  Patient presents with  . Abscess    HPI [name] is a 38 y.o. male.   Patient presents with abscesses on his left hand, left arm, and right arm x 3 weeks.  No treatments attempted at home.  He denies fever or chills.  He was seen in the emergency department on 11/22/2018 and treated with doxycycline.  He had an ED visit yesterday with his PCP and was instructed to go to the emergency department.  Patient has history of IV drug use.  His medical history is also significant for opioid abuse, depression, PTSD, respiratory failure, anxiety, polysubstance abuse.  The history is provided by the patient.    Past Medical History:  Diagnosis Date  . Anxiety   . Benzodiazepine abuse ([place])   . Chronic tachycardia    Per Dr. Wallene Huh (psych)  . Depression   . Drug use   . History of IBS   . Polysubstance abuse [facility])     Patient Active Problem List   Diagnosis Date Noted  . Opioid abuse with opioid-induced mood disorder ([place]) 10/06/2017  . Depression   . PTSD (post-traumatic stress disorder)   . Tachycardia   . Acute blood loss anemia   . Weakness 09/12/2015  . Respiratory failure, acute ([place])   . Respiratory failure ([place])   . Atelectasis   . Altered mental status 09/03/2015  . Opioid overdose ([place]) 08/29/2015  . Leukocytosis 08/29/2015  . Anemia 08/29/2015  . Polysubstance abuse ([place]) 08/29/2015  . Tobacco abuse 08/29/2015    History reviewed. No pertinent surgical history.     Home Medications    Prior to Admission medications   Medication Sig Start Date End Date Taking? Authorizing Provider  acetaminophen-codeine (TYLENOL #3) 300-30 MG tablet 1 po q 12hrs prn pain Patient not taking: Reported on 11/23/2018 01/10/18   Meredith Pel, MD  albuterol (PROVENTIL HFA;VENTOLIN HFA) 108 (90 Base) MCG/ACT inhaler Inhale 1-2 puffs into  the lungs every 6 (six) hours as needed for wheezing or shortness of breath. 11/18/17   Zigmund Gottron, NP  ALPRAZolam Duanne Moron) 1 MG tablet Take 1 mg by mouth 3 (three) times daily as needed for anxiety.  07/02/17   [provider]  Buprenorphine HCl-Naloxone HCl (SUBOXONE) 8-2 MG FILM Place 1 Film under the tongue at bedtime. Patient not taking: Reported on 11/23/2018 09/13/15   Elwin Mocha, MD  Buprenorphine HCl-Naloxone HCl (SUBOXONE) 8-2 MG FILM Place 24 mg of opioid under the tongue daily.     [provider]  doxycycline (VIBRAMYCIN) 100 MG capsule Take 1 capsule (100 mg total) by mouth 2 (two) times daily. 11/23/18   Montine Circle, PA-C  ondansetron (ZOFRAN ODT) 4 MG disintegrating tablet Take 1 tablet (4 mg total) by mouth every 8 (eight) hours as needed for nausea or vomiting. Patient not taking: Reported on 11/23/2018 09/07/18   Ward, Ozella Almond, PA-C  oxycodone (ROXICODONE) 30 MG immediate release tablet Take 30 mg by mouth.    [provider]  temazepam (RESTORIL) 15 MG capsule Take 15 mg by mouth at bedtime as needed for sleep.    [provider]  triamcinolone (KENALOG) 0.025 % cream Apply 1 application topically 2 (two) times daily as needed (for rash).    [provider]    Family History History reviewed. No  pertinent family history.  Social History Social History   Tobacco Use  . Smoking status: Current Every Day Smoker    Packs/day: 0.50    Types: Cigarettes  . Smokeless tobacco: Never Used  Substance Use Topics  . Alcohol use: No  . Drug use: Yes    Types: IV, Cocaine, Marijuana     Allergies   Compazine [prochlorperazine edisylate], Nsaids, Phenergan [promethazine hcl], and Tramadol   Review of Systems Review of Systems  Constitutional: Negative for chills and fever.  HENT: Negative for ear pain and sore throat.   Eyes: Negative for pain and visual disturbance.  Respiratory: Negative for cough and shortness  of breath.   Cardiovascular: Negative for chest pain and palpitations.  Gastrointestinal: Negative for abdominal pain and vomiting.  Genitourinary: Negative for dysuria and hematuria.  Musculoskeletal: Negative for arthralgias and back pain.  Skin: Positive for wound. Negative for color change and rash.  Neurological: Negative for seizures and syncope.  All other systems reviewed and are negative.    Physical Exam Triage Vital Signs ED Triage Vitals  Enc Vitals Group     BP 12/12/18 2004 136/84     Pulse Rate 12/12/18 2004 94     Resp 12/12/18 2004 17     Temp 12/12/18 2004 99.4 F (37.4 C)     Temp Source 12/12/18 2004 Oral     SpO2 12/12/18 2004 100 %     Weight --      Height --      Head Circumference --      Peak Flow --      Pain Score 12/12/18 2005 8     Pain Loc --      Pain Edu? --      Excl. in GC? --    No data found.  Updated Vital Signs BP 136/84 (BP Location: Right Arm)   Pulse 94   Temp 99.4 F (37.4 C) (Oral)   Resp 17   SpO2 100%   Visual Acuity Right Eye Distance:   Left Eye Distance:   Bilateral Distance:    Right Eye Near:   Left Eye Near:    Bilateral Near:     Physical Exam Vitals signs and nursing note reviewed.  Constitutional:      Appearance: He is well-developed.  HENT:     Head: Normocephalic and atraumatic.  Eyes:     Conjunctiva/sclera: Conjunctivae normal.  Neck:     Musculoskeletal: Neck supple.  Cardiovascular:     Rate and Rhythm: Normal rate and regular rhythm.     Heart sounds: No murmur.  Pulmonary:     Effort: Pulmonary effort is normal. No respiratory distress.     Breath sounds: Normal breath sounds.  Abdominal:     Palpations: Abdomen is soft.     Tenderness: There is no abdominal tenderness.  Skin:    General: Skin is warm and dry.     Findings: Lesion present.     Comments: Abscess on left hand, left AC, and right AC.   Neurological:     Mental Status: He is alert.      UC Treatments / Results   Labs (all labs ordered are listed, but only abnormal results are displayed) Labs Reviewed - No data to display  EKG   Radiology No results found.  Procedures Procedures (including critical care time)  Medications Ordered in UC Medications  sodium chloride 0.9 % nebulizer solution (has no administration in time range)  Initial Impression / Assessment and Plan / UC Course  I have reviewed the triage vital signs and the nursing notes.  Pertinent labs & imaging results that were available during my care of the patient were reviewed by me and considered in my medical decision making (see chart for details).   Abscesses on left hand, left AC, and right AC.  Sending patient to the emergency department for evaluation due to to the extent of his abscesses and possible need for IV antibiotics.      Final Clinical Impressions(s) / UC Diagnoses   Final diagnoses:  Abscess of left hand  Abscess of left arm  Abscess of right arm     Discharge Instructions     Go to the emergency department for evaluation.    ED Prescriptions    None     PDMP not reviewed this encounter.   Mickie Bailate, Gwyndolyn Guilford H, NP 12/12/18 2029

## 2018-12-12 NOTE — ED Triage Notes (Signed)
Pt presents with abscesses to bilat arms. Pt requesting IV antibiotics, pt states he has recently finished course of Doxy with no relief.  Pt states he has used IV drugs recently but used AP238, not heroin. pt states he does take Suboxone as well

## 2018-12-12 NOTE — ED Triage Notes (Signed)
Pt presents with recurring multiple abscesses in hands and both arms.

## 2018-12-13 LAB — CBC WITH DIFFERENTIAL/PLATELET
Abs Immature Granulocytes: 0.01 10*3/uL (ref 0.00–0.07)
Basophils Absolute: 0 10*3/uL (ref 0.0–0.1)
Basophils Relative: 0 %
Eosinophils Absolute: 0.1 10*3/uL (ref 0.0–0.5)
Eosinophils Relative: 3 %
HCT: 38.1 % — ABNORMAL LOW (ref 39.0–52.0)
Hemoglobin: 12.2 g/dL — ABNORMAL LOW (ref 13.0–17.0)
Immature Granulocytes: 0 %
Lymphocytes Relative: 25 %
Lymphs Abs: 1.2 10*3/uL (ref 0.7–4.0)
MCH: 30.7 pg (ref 26.0–34.0)
MCHC: 32 g/dL (ref 30.0–36.0)
MCV: 96 fL (ref 80.0–100.0)
Monocytes Absolute: 0.3 10*3/uL (ref 0.1–1.0)
Monocytes Relative: 7 %
Neutro Abs: 3 10*3/uL (ref 1.7–7.7)
Neutrophils Relative %: 65 %
Platelets: 201 10*3/uL (ref 150–400)
RBC: 3.97 MIL/uL — ABNORMAL LOW (ref 4.22–5.81)
RDW: 13.3 % (ref 11.5–15.5)
WBC: 4.6 10*3/uL (ref 4.0–10.5)
nRBC: 0 % (ref 0.0–0.2)

## 2018-12-13 LAB — BASIC METABOLIC PANEL
Anion gap: 11 (ref 5–15)
BUN: 6 mg/dL (ref 6–20)
CO2: 27 mmol/L (ref 22–32)
Calcium: 9.1 mg/dL (ref 8.9–10.3)
Chloride: 103 mmol/L (ref 98–111)
Creatinine, Ser: 0.79 mg/dL (ref 0.61–1.24)
GFR calc Af Amer: >60 mL/min (ref >60)
GFR calc non Af Amer: >60 mL/min (ref >60)
Glucose, Bld: 78 mg/dL (ref 70–99)
Potassium: 3.4 mmol/L — ABNORMAL LOW (ref 3.5–5.1)
Sodium: 141 mmol/L (ref 135–145)

## 2018-12-13 MED ORDER — VANCOMYCIN HCL 10 G IV SOLR: 1250.0000 mg | Freq: Two times a day (BID) | INTRAVENOUS | Status: DC

## 2018-12-13 MED ORDER — VANCOMYCIN HCL IN DEXTROSE 1-5 GM/200ML-% IV SOLN
1000.0000 mg | Freq: Once | INTRAVENOUS | Status: AC
  Administered 2018-12-13: 1000 mg via INTRAVENOUS
  Filled 2018-12-13: qty 200

## 2018-12-13 MED ORDER — CLINDAMYCIN HCL 150 MG PO CAPS: 300.0000 mg | ORAL_CAPSULE | Freq: Three times a day (TID) | ORAL | 0 refills | Status: DC

## 2018-12-13 NOTE — ED Provider Notes (Signed)
[facility] EMERGENCY DEPARTMENT Provider Note   CSN: [id number] Arrival date & time: 12/12/18  2106     History   Chief Complaint Chief Complaint  Patient presents with  . Cellulitis    HPI [name] is a 38 y.o. male.     Patient presents to the emergency department with a chief complaint of wound infections.  He states that he has been seen recently for cellulitis and has taken antibiotics, but continues to have wounds and infection surrounding his wounds.  He states that he has not recently used IV drugs, but does have a significant history of the same.  He denies any fevers or chills.  Denies nausea or vomiting.  Denies any other associated symptoms.  The history is provided by the patient. No language interpreter was used.    Past Medical History:  Diagnosis Date  . Anxiety   . Benzodiazepine abuse ([place])   . Chronic tachycardia    Per Dr. Wallene Huh (psych)  . Depression   . Drug use   . History of IBS   . Polysubstance abuse [facility])     Patient Active Problem List   Diagnosis Date Noted  . Opioid abuse with opioid-induced mood disorder ([place]) 10/06/2017  . Depression   . PTSD (post-traumatic stress disorder)   . Tachycardia   . Acute blood loss anemia   . Weakness 09/12/2015  . Respiratory failure, acute ([place])   . Respiratory failure ([place])   . Atelectasis   . Altered mental status 09/03/2015  . Opioid overdose ([place]) 08/29/2015  . Leukocytosis 08/29/2015  . Anemia 08/29/2015  . Polysubstance abuse ([place]) 08/29/2015  . Tobacco abuse 08/29/2015    History reviewed. No pertinent surgical history.      Home Medications    Prior to Admission medications   Medication Sig Start Date End Date Taking? Authorizing Provider  acetaminophen-codeine (TYLENOL #3) 300-30 MG tablet 1 po q 12hrs prn pain Patient not taking: Reported on 11/23/2018 01/10/18   Meredith Pel, MD  albuterol (PROVENTIL HFA;VENTOLIN HFA) 108 (90 Base) MCG/ACT  inhaler Inhale 1-2 puffs into the lungs every 6 (six) hours as needed for wheezing or shortness of breath. 11/18/17   Zigmund Gottron, NP  ALPRAZolam Duanne Moron) 1 MG tablet Take 1 mg by mouth 3 (three) times daily as needed for anxiety.  07/02/17   [provider]  Buprenorphine HCl-Naloxone HCl (SUBOXONE) 8-2 MG FILM Place 1 Film under the tongue at bedtime. Patient not taking: Reported on 11/23/2018 09/13/15   Elwin Mocha, MD  Buprenorphine HCl-Naloxone HCl (SUBOXONE) 8-2 MG FILM Place 24 mg of opioid under the tongue daily.     [provider]  doxycycline (VIBRAMYCIN) 100 MG capsule Take 1 capsule (100 mg total) by mouth 2 (two) times daily. 11/23/18   Montine Circle, PA-C  ondansetron (ZOFRAN ODT) 4 MG disintegrating tablet Take 1 tablet (4 mg total) by mouth every 8 (eight) hours as needed for nausea or vomiting. Patient not taking: Reported on 11/23/2018 09/07/18   Ward, Ozella Almond, PA-C  oxycodone (ROXICODONE) 30 MG immediate release tablet Take 30 mg by mouth.    [provider]  temazepam (RESTORIL) 15 MG capsule Take 15 mg by mouth at bedtime as needed for sleep.    [provider]  triamcinolone (KENALOG) 0.025 % cream Apply 1 application topically 2 (two) times daily as needed (for rash).    [provider]    Family History No family  history on file.  Social History Social History   Tobacco Use  . Smoking status: Current Every Day Smoker    Packs/day: 0.50    Types: Cigarettes  . Smokeless tobacco: Never Used  Substance Use Topics  . Alcohol use: No  . Drug use: Yes    Types: IV, Cocaine, Marijuana    Comment: AP238     Allergies   Compazine [prochlorperazine edisylate], Nsaids, Phenergan [promethazine hcl], and Tramadol   Review of Systems Review of Systems  All other systems reviewed and are negative.    Physical Exam Updated Vital Signs BP 117/88 (BP Location: Right Arm)   Pulse 69   Temp 98.2 F (36.8 C)  (Oral)   Resp 19   Ht 5\' 8"  (1.727 m)   Wt 68 kg   SpO2 99%   BMI 22.79 kg/m   Physical Exam Vitals signs and nursing note reviewed.  Constitutional:      Appearance: He is well-developed.  HENT:     Head: Normocephalic and atraumatic.  Eyes:     Conjunctiva/sclera: Conjunctivae normal.  Neck:     Musculoskeletal: Neck supple.  Cardiovascular:     Rate and Rhythm: Normal rate and regular rhythm.     Heart sounds: No murmur.  Pulmonary:     Effort: Pulmonary effort is normal. No respiratory distress.     Breath sounds: Normal breath sounds.  Abdominal:     Palpations: Abdomen is soft.     Tenderness: There is no abdominal tenderness.  Musculoskeletal: Normal range of motion.  Skin:    General: Skin is warm and dry.     Comments: Numerous sores on upper and lower extremities, no significant evidence of severe cellulitis  Neurological:     Mental Status: He is alert and oriented to person, place, and time.  Psychiatric:        Mood and Affect: Mood normal.        Behavior: Behavior normal.      ED Treatments / Results  Labs (all labs ordered are listed, but only abnormal results are displayed) Labs Reviewed  CBC WITH DIFFERENTIAL/PLATELET  BASIC METABOLIC PANEL    EKG None  Radiology No results found.  Procedures Procedures (including critical care time)  Medications Ordered in ED Medications  vancomycin (VANCOCIN) IVPB 1000 mg/200 mL premix (has no administration in time range)     Initial Impression / Assessment and Plan / ED Course  I have reviewed the triage vital signs and the nursing notes.  Pertinent labs & imaging results that were available during my care of the patient were reviewed by me and considered in my medical decision making (see chart for details).        Patient with recurrent skin infections, thought to be secondary to IV drug use.  No drainable abscess today.  Mild erythema surrounding several different wounds.  Have advised  patient that he needs to follow-up with dermatology or with infectious disease.  Will give loading dose of antibiotic here and clindamycin for home.  Final Clinical Impressions(s) / ED Diagnoses   Final diagnoses:  Cellulitis of upper extremity, unspecified laterality    ED Discharge Orders    None       , PA-C 12/13/18 12/15/18    1751, MD 12/13/18 [phone]

## 2018-12-13 NOTE — Progress Notes (Signed)
Pharmacy Antibiotic Note  [name] is a 38 y.o. male admitted on 12/12/2018 with cellulitis.  Pharmacy has been consulted for Vancomycin dosing.WBC WNL. Renal function good.  Plan: Vancomycin 1250 mg IV q12h >>Estimated AUC: 471 Trend WBC, temp, renal function  F/U infectious work-up Drug levels as indicated   Height: 5\' 8"  (172.7 cm) Weight: 149 lb 14.6 oz (68 kg) IBW/kg (Calculated) : 68.4  Temp (24hrs), Avg:98.4 F (36.9 C), Min:97.5 F (36.4 C), Max:99.4 F (37.4 C)  Recent Labs  Lab 12/13/18 0448  WBC 4.6  CREATININE 0.79    Estimated Creatinine Clearance: 122.8 mL/min (by C-G formula based on SCr of 0.79 mg/dL).    Allergies  Allergen Reactions  . Compazine [Prochlorperazine Edisylate] Other (See Comments)    Blood pressure drops  . Nsaids Other (See Comments)    unknown  . Phenergan [Promethazine Hcl] Other (See Comments)    Blood pressure drops   . Tramadol Other (See Comments)    Blood pressure drops     Narda Bonds, PharmD, BCPS Clinical Pharmacist Phone: [phone]

## 2018-12-13 NOTE — ED Notes (Signed)
Pt is noted to be ambulatory back to the room, with steady gait and no noted difficulty.

## 2018-12-13 NOTE — ED Notes (Signed)
Discharge instructions discussed with pt. Pt verbalized understanding. Pt stable and ambulatory. No signature pad available. 

## 2018-12-19 ENCOUNTER — Encounter: Payer: Self-pay | Admitting: Infectious Diseases

## 2018-12-19 ENCOUNTER — Ambulatory Visit (INDEPENDENT_AMBULATORY_CARE_PROVIDER_SITE_OTHER): Payer: Medicaid Other | Admitting: Infectious Diseases

## 2018-12-19 ENCOUNTER — Other Ambulatory Visit: Payer: Self-pay

## 2018-12-19 VITALS — BP 119/77 | HR 92 | Temp 98.0°F | Ht 68.0 in | Wt 132.0 lb

## 2018-12-19 DIAGNOSIS — L0292 Furuncle, unspecified: Secondary | ICD-10-CM | POA: Diagnosis present

## 2018-12-19 DIAGNOSIS — L03119 Cellulitis of unspecified part of limb: Secondary | ICD-10-CM

## 2018-12-19 DIAGNOSIS — F1990 Other psychoactive substance use, unspecified, uncomplicated: Secondary | ICD-10-CM

## 2018-12-19 MED ORDER — CLINDAMYCIN HCL 150 MG PO CAPS: 300.0000 mg | ORAL_CAPSULE | Freq: Three times a day (TID) | ORAL | 0 refills | Status: DC

## 2018-12-19 NOTE — Patient Instructions (Addendum)
Abstain from further IV drug use. Obtain jeweller's gloves (white cloth) to prevent further injuries to skin. Trim nails short and keep well rounded. Soak fingernails daily to point all visible dirt has been removed.

## 2018-12-19 NOTE — Progress Notes (Signed)
Subjective:    Patient ID: [name], male    DOB: 12-08-1980, 38 y.o.   MRN: [id number]  HPI The patient is a 38 year old white male IV drug user with agoraphobia who presents for an evaluation for recent bilateral arm cellulitis/abscesses.  The patient was has been evaluated in 2 different urgent cares on 3 occasions within the last several weeks.  Most recently, he was evaluated at [facility] urgent care on October 23 and given a dose of vancomycin.  He failed to previously respond to oral doxycycline, so his antibiotics were changed to oral clindamycin on October 24 and he returned again with further concern for a left hand abscess.  There was no purulent drainage noted from his wound and no I&D was performed nor imaging.  His white blood cell count has remained within normal limits from both of his prior CBCs obtained this month.  While the patient had been sober from illicit substances for approximately 3 years, he now admits to 3 occasions in the past month in which he is relapsed with his habit.  He blames anxiety related to the pandemic and his soon-to-be ex-girlfriend encouraging him to crush prescription medications and inject them.  The areas of cellulitis that he is recently had have all correlated with injection sites.  Per the patient, his recent episode has been improving with changing of his medications from oral doxycycline to clindamycin within the last several days.  On today's evaluation, he does not have any purulent drainage but does have 2 firm nodules on the dorsum of his left hand.  The patient also does have a history of suicide attempts with prior scars to both of his wrists and his left antecubital fossa.  While he does have induration at the sites, he does not have overlying erythema at this time.  His hand hygiene is quite poor at today's visit.  He also reports that his mother had nasal MRSA in recent weeks and his girlfriend had a groin infection with MRSA within the  last 2 months.  He was sent to our office for refractory infection and concern for an immune deficiency.   Past Medical History:  Diagnosis Date   Anxiety    Benzodiazepine abuse ([place])    Chronic tachycardia    Per Dr. Wallene Huh (psych)   Depression    Drug use    History of IBS    Polysubstance abuse ([place])     History reviewed. No pertinent surgical history.   History reviewed. No pertinent family history. mother - recent MRSA per pt  Social History   Tobacco Use   Smoking status: Current Every Day Smoker    Packs/day: 0.50    Types: Cigarettes   Smokeless tobacco: Never Used  Substance Use Topics   Alcohol use: No   Drug use: Yes    Types: IV, Cocaine, Marijuana, Amphetamines, Hydrocodone, Oxycodone    Comment: [id number]      reports being sexually active. He reports using the following method of birth control/protection: Condom.   Outpatient Medications Prior to Visit  Medication Sig Dispense Refill   ALPRAZolam (XANAX) 1 MG tablet Take 1 mg by mouth 3 (three) times daily as needed for anxiety.   4   doxycycline (VIBRAMYCIN) 100 MG capsule Take 1 capsule (100 mg total) by mouth 2 (two) times daily. 20 capsule 0   ondansetron (ZOFRAN ODT) 4 MG disintegrating tablet Take 1 tablet (4 mg total) by mouth every 8 (eight) hours as  needed for nausea or vomiting. 10 tablet 0   temazepam (RESTORIL) 15 MG capsule Take 15 mg by mouth at bedtime as needed for sleep.     triamcinolone (KENALOG) 0.025 % cream Apply 1 application topically 2 (two) times daily as needed (for rash).     clindamycin (CLEOCIN) 150 MG capsule Take 2 capsules (300 mg total) by mouth 3 (three) times daily. May dispense as 150mg  capsules 60 capsule 0   acetaminophen-codeine (TYLENOL #3) 300-30 MG tablet 1 po q 12hrs prn pain (Patient not taking: Reported on 11/23/2018) 20 tablet 0   albuterol (PROVENTIL HFA;VENTOLIN HFA) 108 (90 Base) MCG/ACT inhaler Inhale 1-2 puffs into the lungs every 6 (six)  hours as needed for wheezing or shortness of breath. 1 Inhaler 0   Buprenorphine HCl-Naloxone HCl (SUBOXONE) 8-2 MG FILM Place 1 Film under the tongue at bedtime. (Patient not taking: Reported on 11/23/2018) 30 each    Buprenorphine HCl-Naloxone HCl (SUBOXONE) 8-2 MG FILM Place 24 mg of opioid under the tongue daily.      oxycodone (ROXICODONE) 30 MG immediate release tablet Take 30 mg by mouth.     No facility-administered medications prior to visit.      Allergies  Allergen Reactions   Compazine [Prochlorperazine Edisylate] Other (See Comments)    Blood pressure drops   Nsaids Other (See Comments)    unknown   Phenergan [Promethazine Hcl] Other (See Comments)    Blood pressure drops    Tramadol Other (See Comments)    Blood pressure drops       Review of Systems  Constitutional: Positive for fatigue. Negative for chills and fever.  HENT: Negative for congestion, hearing loss, rhinorrhea and sinus pressure.   Eyes: Negative for photophobia, pain, redness and visual disturbance.  Respiratory: Negative for apnea, cough, shortness of breath and wheezing.   Cardiovascular: Negative for chest pain and palpitations.  Gastrointestinal: Positive for abdominal distention and diarrhea. Negative for abdominal pain, constipation, nausea and vomiting.  Endocrine: Negative for cold intolerance, heat intolerance, polydipsia and polyuria.  Genitourinary: Negative for decreased urine volume, dysuria, frequency, hematuria and testicular pain.  Musculoskeletal: Negative for back pain, myalgias and neck pain.  Skin: Positive for rash. Negative for pallor.  Allergic/Immunologic: Negative for immunocompromised state.  Neurological: Negative for dizziness, seizures, syncope, speech difficulty and light-headedness.  Hematological: Does not bruise/bleed easily.  Psychiatric/Behavioral: Positive for self-injury and sleep disturbance. Negative for agitation and hallucinations. The patient is  nervous/anxious.        Objective:     Vitals:   12/19/18 0914  BP: 119/77  Pulse: 92  Temp: 98 F (36.7 C)     Physical Exam Gen: anxious, thin/emaciated, NAD, A&Ox 3 Head: NCAT, no temporal wasting evident EENT: PERRL, EOMI, +eyeglasses intact, MMM, poor dentition Neck: supple, no JVD CV: NRRR, no murmurs evident Pulm: CTA bilaterally, no wheeze or retractions Abd: soft, NTND, +BS Extrems:  no LE edema, 2+ pulses Extrems: 2 firm indurated nodules along dorsum of LT hand w/ overlying erythema but no fluctuance or expressible drainage Skin: no rashes, adequate skin turgor Neuro: CN II-XII grossly intact, no focal neurologic deficits appreciated, gait was normal, A&Ox 3  Psych: extremely anxious, numerous tics observed  Labs: Lab Results  Component Value Date   WBC 4.6 12/13/2018   HGB 12.2 (L) 12/13/2018   HCT 38.1 (L) 12/13/2018   MCV 96.0 12/13/2018   PLT 201 12/13/2018   Lab Results  Component Value Date   NA 141 12/13/2018  K 3.4 (L) 12/13/2018   CL 103 12/13/2018   CO2 27 12/13/2018   GLUCOSE 78 12/13/2018   BUN 6 12/13/2018   CREATININE 0.79 12/13/2018   CALCIUM 9.1 12/13/2018   MG 2.0 09/13/2015   PHOS 3.5 09/13/2015   No results found for: CRP      Assessment & Plan:  The patient is a 38 year old white male IV drug user with significant anxiety and agoraphobia presenting today for an evaluation for recurrent upper extremity cellulitis/abscesses.  1.  Recurrent upper extremity cellulitis/abscesses -rather than an immune deficiency, the most likely explanation for the patient having contralateral/bilateral upper extremity cellulitis with recent abscesses would be his admission to a relapse in his habit with IV drug use.  I spent a significant time discussing the relationship between piercing of his skin with IV needles and his current infectious state.  Unfortunately, no cultures have been obtained of any of his wounds recently and he does not have any  fluctuance nor open draining wounds at the present time to allow for culture today.  He has shown improvement with oral clindamycin, so we will continue this medication at a dose of 300 mg p.o. 3 times daily for a 3-week course only because of the fact that the patient does appear to have 2 small nodular cysts/small acute subcutaneous abscesses to the dorsum of his left hand.  We will check an HIV antibody and hepatitis C antibody to assess for immune function, but I do not feel further work-up would be warranted at this time given his now admission relapsing with his IV drug use.  We will also check an MRSA nasal PCR to assess if the patient is even colonized with MRSA.  If not, this may explain why he poorly responded to oral doxycycline previously as he may have infection with other pathogen such as strep or MSSA.  If the patient returns to the urgent care or ER, I would encourage that his abscesses be opened and drained and a culture obtained to best target further antibiotic therapy.  This is of particular importance as the patient is not a candidate for a PICC line as an outpatient due to his active IV drug use.  2.  Active IV drug use -patient was encouraged to seek psychiatric care and discuss this with his primary care physician as he would likely benefit from a referral to an outpatient rehab center for his substance abuse.  As the patient admits to injecting methamphetamines and narcotics in recent weeks, he would likely best benefit from attending Narcotics Anonymous meetings.  At the present time, the patient seems to be in a certain amount of denial related to his infections and this habit, blaming his infections on exposures to his girlfriend and his mother who have recently reported furunculosis.  While this may be playing a small role, the patient's active IV drug use would be the more likely explanation of his current infection.  If he continues this habit, the patient will continue to have  relapsing courses of infection due to reexposure/reinoculation.

## 2018-12-20 ENCOUNTER — Encounter: Payer: Self-pay | Admitting: Infectious Diseases

## 2018-12-22 LAB — HEPATITIS C ANTIBODY
Hepatitis C Ab: NONREACTIVE
SIGNAL TO CUT-OFF: 0.05 (ref <1.00)

## 2018-12-22 LAB — METHICILLIN RESISTANT STAPH AUREUS, PCR: Methicillin Resistant Staph Aureus, PCR: NOT DETECTED

## 2018-12-22 LAB — HIV ANTIBODY (ROUTINE TESTING W REFLEX): HIV 1&2 Ab, 4th Generation: NONREACTIVE

## 2020-12-04 ENCOUNTER — Emergency Department (HOSPITAL_COMMUNITY): Payer: Medicaid Other

## 2020-12-04 ENCOUNTER — Encounter (HOSPITAL_COMMUNITY): Payer: Self-pay | Admitting: Emergency Medicine

## 2020-12-04 ENCOUNTER — Other Ambulatory Visit: Payer: Self-pay

## 2020-12-04 ENCOUNTER — Observation Stay (HOSPITAL_COMMUNITY)
Admission: EM | Admit: 2020-12-04 | Discharge: 2020-12-05 | Disposition: A | Discharge status: Home / Self Care | Payer: Medicaid Other | Attending: Emergency Medicine | Admitting: Emergency Medicine

## 2020-12-04 DIAGNOSIS — R109 Unspecified abdominal pain: Secondary | ICD-10-CM | POA: Diagnosis not present

## 2020-12-04 DIAGNOSIS — S27321A Contusion of lung, unilateral, initial encounter: Secondary | ICD-10-CM | POA: Diagnosis not present

## 2020-12-04 DIAGNOSIS — Z20822 Contact with and (suspected) exposure to covid-19: Secondary | ICD-10-CM | POA: Insufficient documentation

## 2020-12-04 DIAGNOSIS — R55 Syncope and collapse: Secondary | ICD-10-CM | POA: Diagnosis not present

## 2020-12-04 DIAGNOSIS — Z79899 Other long term (current) drug therapy: Secondary | ICD-10-CM | POA: Insufficient documentation

## 2020-12-04 DIAGNOSIS — W11XXXA Fall on and from ladder, initial encounter: Secondary | ICD-10-CM | POA: Diagnosis not present

## 2020-12-04 DIAGNOSIS — S2241XA Multiple fractures of ribs, right side, initial encounter for closed fracture: Secondary | ICD-10-CM | POA: Diagnosis not present

## 2020-12-04 DIAGNOSIS — T1490XA Injury, unspecified, initial encounter: Secondary | ICD-10-CM

## 2020-12-04 DIAGNOSIS — S42292A Other displaced fracture of upper end of left humerus, initial encounter for closed fracture: Principal | ICD-10-CM | POA: Insufficient documentation

## 2020-12-04 DIAGNOSIS — S4992XA Unspecified injury of left shoulder and upper arm, initial encounter: Secondary | ICD-10-CM | POA: Diagnosis present

## 2020-12-04 DIAGNOSIS — W19XXXA Unspecified fall, initial encounter: Secondary | ICD-10-CM

## 2020-12-04 DIAGNOSIS — S2249XA Multiple fractures of ribs, unspecified side, initial encounter for closed fracture: Secondary | ICD-10-CM | POA: Diagnosis present

## 2020-12-04 LAB — CBC WITH DIFFERENTIAL/PLATELET
Abs Immature Granulocytes: 0.03 10*3/uL (ref 0.00–0.07)
Basophils Absolute: 0 10*3/uL (ref 0.0–0.1)
Basophils Relative: 0 %
Eosinophils Absolute: 0 10*3/uL (ref 0.0–0.5)
Eosinophils Relative: 0 %
HCT: 37.3 % — ABNORMAL LOW (ref 39.0–52.0)
Hemoglobin: 12.1 g/dL — ABNORMAL LOW (ref 13.0–17.0)
Immature Granulocytes: 0 %
Lymphocytes Relative: 15 %
Lymphs Abs: 1.6 10*3/uL (ref 0.7–4.0)
MCH: 29.7 pg (ref 26.0–34.0)
MCHC: 32.4 g/dL (ref 30.0–36.0)
MCV: 91.4 fL (ref 80.0–100.0)
Monocytes Absolute: 0.7 10*3/uL (ref 0.1–1.0)
Monocytes Relative: 6 %
Neutro Abs: 8.8 10*3/uL — ABNORMAL HIGH (ref 1.7–7.7)
Neutrophils Relative %: 79 %
Platelets: 330 10*3/uL (ref 150–400)
RBC: 4.08 MIL/uL — ABNORMAL LOW (ref 4.22–5.81)
RDW: 12.7 % (ref 11.5–15.5)
WBC: 11.2 10*3/uL — ABNORMAL HIGH (ref 4.0–10.5)
nRBC: 0 % (ref 0.0–0.2)

## 2020-12-04 LAB — BASIC METABOLIC PANEL
Anion gap: 10 (ref 5–15)
BUN: 11 mg/dL (ref 6–20)
CO2: 28 mmol/L (ref 22–32)
Calcium: 8.9 mg/dL (ref 8.9–10.3)
Chloride: 100 mmol/L (ref 98–111)
Creatinine, Ser: 0.91 mg/dL (ref 0.61–1.24)
GFR, Estimated: >60 mL/min (ref >60)
Glucose, Bld: 101 mg/dL — ABNORMAL HIGH (ref 70–99)
Potassium: 3.4 mmol/L — ABNORMAL LOW (ref 3.5–5.1)
Sodium: 138 mmol/L (ref 135–145)

## 2020-12-04 LAB — HEPATIC FUNCTION PANEL
ALT: 42 U/L (ref 0–44)
AST: 104 U/L — ABNORMAL HIGH (ref 15–41)
Albumin: 3.5 g/dL (ref 3.5–5.0)
Alkaline Phosphatase: 210 U/L — ABNORMAL HIGH (ref 38–126)
Bilirubin, Direct: 0.8 mg/dL — ABNORMAL HIGH (ref 0.0–0.2)
Indirect Bilirubin: 1 mg/dL — ABNORMAL HIGH (ref 0.3–0.9)
Total Bilirubin: 1.8 mg/dL — ABNORMAL HIGH (ref 0.3–1.2)
Total Protein: 6.3 g/dL — ABNORMAL LOW (ref 6.5–8.1)

## 2020-12-04 LAB — LACTIC ACID, PLASMA
Lactic Acid, Venous: 2 mmol/L (ref 0.5–1.9)
Lactic Acid, Venous: 1.5 mmol/L (ref 0.5–1.9)

## 2020-12-04 LAB — LIPASE, BLOOD: Lipase: 21 U/L (ref 11–51)

## 2020-12-04 LAB — TROPONIN I (HIGH SENSITIVITY)
Troponin I (High Sensitivity): 4 ng/L (ref <18)
Troponin I (High Sensitivity): 6 ng/L (ref <18)

## 2020-12-04 MED ORDER — HYDROMORPHONE HCL 1 MG/ML IJ SOLN
1.0000 mg | Freq: Once | INTRAMUSCULAR | Status: AC
  Administered 2020-12-04: 1 mg via INTRAVENOUS
  Filled 2020-12-04: qty 1

## 2020-12-04 MED ORDER — HYDROMORPHONE HCL 1 MG/ML IJ SOLN: 1.0000 mg | INTRAMUSCULAR | Status: DC | PRN

## 2020-12-04 MED ORDER — GABAPENTIN 300 MG PO CAPS: 600.0000 mg | ORAL_CAPSULE | Freq: Three times a day (TID) | ORAL | Status: DC

## 2020-12-04 MED ORDER — KETOROLAC TROMETHAMINE 30 MG/ML IJ SOLN: 30.0000 mg | Freq: Three times a day (TID) | INTRAMUSCULAR | Status: DC

## 2020-12-04 MED ORDER — ACETAMINOPHEN 325 MG PO TABS: 650.0000 mg | ORAL_TABLET | ORAL | Status: DC | PRN

## 2020-12-04 MED ORDER — PANTOPRAZOLE SODIUM 40 MG IV SOLR: 40.0000 mg | Freq: Every day | INTRAVENOUS | Status: DC

## 2020-12-04 MED ORDER — METOPROLOL TARTRATE 5 MG/5ML IV SOLN
5.0000 mg | Freq: Four times a day (QID) | INTRAVENOUS | Status: DC | PRN
Start: 2020-12-04 — End: 2020-12-05

## 2020-12-04 MED ORDER — FENTANYL CITRATE PF 50 MCG/ML IJ SOSY
50.0000 ug | PREFILLED_SYRINGE | Freq: Once | INTRAMUSCULAR | Status: AC
  Administered 2020-12-04: 50 ug via INTRAVENOUS
  Filled 2020-12-04: qty 1

## 2020-12-04 MED ORDER — TEMAZEPAM 15 MG PO CAPS: 15.0000 mg | ORAL_CAPSULE | Freq: Every day | ORAL | Status: DC

## 2020-12-04 MED ORDER — SODIUM CHLORIDE 0.9 % IV BOLUS
1000.0000 mL | Freq: Once | INTRAVENOUS | Status: AC
  Administered 2020-12-04: 1000 mL via INTRAVENOUS

## 2020-12-04 MED ORDER — MELATONIN 3 MG PO TABS: 3.0000 mg | ORAL_TABLET | Freq: Every evening | ORAL | Status: DC | PRN

## 2020-12-04 MED ORDER — ENOXAPARIN SODIUM 30 MG/0.3ML IJ SOSY: 30.0000 mg | PREFILLED_SYRINGE | Freq: Two times a day (BID) | INTRAMUSCULAR | Status: DC

## 2020-12-04 MED ORDER — PANTOPRAZOLE SODIUM 40 MG PO TBEC: 40.0000 mg | DELAYED_RELEASE_TABLET | Freq: Every day | ORAL | Status: DC

## 2020-12-04 MED ORDER — DEXTROSE-NACL 5-0.45 % IV SOLN: INTRAVENOUS | Status: DC

## 2020-12-04 MED ORDER — ONDANSETRON 4 MG PO TBDP: 4.0000 mg | ORAL_TABLET | Freq: Four times a day (QID) | ORAL | Status: DC | PRN

## 2020-12-04 MED ORDER — ONDANSETRON HCL 4 MG/2ML IJ SOLN: 4.0000 mg | Freq: Four times a day (QID) | INTRAMUSCULAR | Status: DC | PRN

## 2020-12-04 MED ORDER — LORAZEPAM 1 MG PO TABS: 1.0000 mg | ORAL_TABLET | Freq: Four times a day (QID) | ORAL | Status: DC | PRN

## 2020-12-04 MED ORDER — OXYCODONE HCL 5 MG PO TABS: 5.0000 mg | ORAL_TABLET | ORAL | Status: DC | PRN

## 2020-12-04 NOTE — ED Notes (Signed)
Ortho called to come place sling on pt

## 2020-12-04 NOTE — ED Provider Notes (Signed)
Emergency Medicine Provider Triage Evaluation Note  [name] , a 40 y.o. male  was evaluated in triage.  Pt complains of falling off 6 foot tall ladder  yesterday and landing onto left side. Had to crawl into house which took 4 hours. Has mostly been complaining of L humerus pain at this time. States he passed out however denies head injury. Also complains of left rib pain and bilateral knee pain.  Review of Systems  Positive: + L arm pain, bilateral knee pain, syncope Negative: - head injury  Physical Exam  BP 117/85 (BP Location: Right Arm)   Pulse (!) 104   Temp 99.7 F (37.6 C) (Oral)   Resp 18   SpO2 98%  Gen:   Awake, no distress   Resp:  Normal effort  MSK:   Moves extremities without difficulty  Other:  Significant bruising to L humerus with TTP. 2+ radial pulse. + bilateral knee TTP. + left rib TTP. LCTAB.   Medical Decision Making  Medically screening exam initiated at 5:20 PM.  Appropriate orders placed.  BRAND SIEVER was informed that the remainder of the evaluation will be completed by another provider, this initial triage assessment does not replace that evaluation, and the importance of remaining in the ED until their evaluation is complete.     Tanda Rockers, PA-C 12/04/20 1721    Benjiman Core, MD 12/04/20 2024

## 2020-12-04 NOTE — ED Notes (Signed)
CT Tech to notify Rns that pt iv not flushing. MD and PA notified - if IV does not work then do with non contrast - IV unable to be flushed in - IV catheter kinked. IV team consult to be put in again

## 2020-12-04 NOTE — ED Provider Notes (Signed)
[facility] EMERGENCY DEPARTMENT Provider Note   CSN: [id number] Arrival date & time: 12/04/20  1635    History Chief Complaint  Patient presents with   Lytle Michaels    JERMERY CARATACHEA is a 40 y.o. male with history significant for polysubstance use in remission, last use 1 years ago no on chronic Suboxone, last dose greater than 24 hours ago who presents for evaluation of fall.  Was approximately 6 to 7 feet up on a ladder when he subsequently fell onto his left shoulder.  States he has had significant pain to his left chest, left shoulder, left abdomen as well as midline back, right knee.  He has been able to ambulate since the incident however noticed moderate bruising, swelling and pain to his left shoulder since which lead to visit today.  He denies any paresthesias or weakness.  He does have a mild headache.  Of note patient did have a fall 3 to 4 days ago.  Apparently had LOC.  Per mother patient was unresponsive however was breathing.  She subsequently attempted chest compressions due to his unresponsiveness however mother does state the patient was breathing at this time. EMS was not called and no ED visit at that time.  He denies any overdose, etoh use, illicit substance use.  No fever, blurred vision, shortness of breath, diarrhea, numbness, weakness.  Denies additional aggravating or alleviating factors. Rates his pain 10/10  Tetanus is up-to-date  Last Suboxone greater than 24 hours ago  History obtained from patient, mother in room and past medical records.  No interpreter used.  HPI     Past Medical History:  Diagnosis Date   Anxiety    Benzodiazepine abuse ([place])    Chronic tachycardia    Per Dr. Wallene Huh (psych)   Depression    Drug use    History of IBS    Polysubstance abuse [facility])     Patient Active Problem List   Diagnosis Date Noted   Cellulitis of multiple sites of upper arm 10/2018   Opioid abuse with opioid-induced mood disorder ([place])  10/06/2017   Depression    PTSD (post-traumatic stress disorder)    Tachycardia    Acute blood loss anemia    Weakness 09/12/2015   Respiratory failure, acute ([place])    Respiratory failure ([facility])    Atelectasis    Altered mental status 09/03/2015   Opioid overdose ([place]) 08/29/2015   Leukocytosis 08/29/2015   Anemia 08/29/2015   Polysubstance abuse ([place]) 08/29/2015   Tobacco abuse 08/29/2015    History reviewed. No pertinent surgical history.     No family history on file.  Social History   Tobacco Use   Smoking status: Every Day    Packs/day: 0.50    Types: Cigarettes   Smokeless tobacco: Never  Vaping Use   Vaping Use: Never used  Substance Use Topics   Alcohol use: No   Drug use: Yes    Types: IV, Cocaine, Marijuana, Amphetamines, Hydrocodone, Oxycodone    Comment: [id number]    Home Medications Prior to Admission medications   Medication Sig Start Date End Date Taking? Authorizing Provider  albuterol (VENTOLIN HFA) 108 (90 Base) MCG/ACT inhaler Inhale 1-2 puffs into the lungs every 6 (six) hours as needed for wheezing or shortness of breath.   Yes [provider]  buprenorphine-naloxone (SUBOXONE) 8-2 mg SUBL SL tablet Place 1-2 tablets under the tongue See admin instructions. 2 films in the morning 1 film in the evening  Yes [provider]  gabapentin (NEURONTIN) 600 MG tablet Take 600 mg by mouth 3 (three) times daily. 11/11/20  Yes [provider]  ibuprofen (ADVIL) 200 MG tablet Take 800 mg by mouth every 6 (six) hours as needed for headache or moderate pain.   Yes [provider]  LORazepam (ATIVAN) 1 MG tablet Take 1 mg by mouth 4 (four) times daily. 11/29/20  Yes [provider]  naproxen (NAPROSYN) 500 MG tablet Take 500 mg by mouth 2 (two) times daily as needed for moderate pain. 10/01/19  Yes [provider]  ondansetron (ZOFRAN ODT) 4 MG disintegrating tablet Take 1 tablet (4 mg total) by mouth every 8  (eight) hours as needed for nausea or vomiting. 09/07/18  Yes Ward, Ozella Almond, PA-C  polyethylene glycol powder (GLYCOLAX/MIRALAX) 17 GM/SCOOP powder Take 17 g by mouth at bedtime.   Yes [provider]  temazepam (RESTORIL) 15 MG capsule Take 15 mg by mouth at bedtime.   Yes [provider]  triamcinolone (KENALOG) 0.025 % cream Apply 1 application topically 2 (two) times daily as needed (for rash).   Yes [provider]  Fluvoxamine Maleate 100 MG CP24 Take 200 mg by mouth daily. Patient not taking: No sig reported 11/29/20   [provider]    Allergies    Compazine [prochlorperazine edisylate], Haldol [haloperidol], Nsaids, Phenergan [promethazine hcl], Reglan [metoclopramide], and Tramadol  Review of Systems   Review of Systems  Constitutional: Negative.   HENT: Negative.    Respiratory: Negative.    Cardiovascular:  Positive for chest pain (Chest wall pain, posterior left).  Gastrointestinal:  Positive for abdominal pain (Left upper abd pain). Negative for abdominal distention, anal bleeding, blood in stool, constipation, diarrhea, nausea and vomiting.  Genitourinary: Negative.   Musculoskeletal:  Positive for back pain. Negative for gait problem, joint swelling, myalgias, neck pain and neck stiffness.  Skin:  Positive for color change.       Bruising  Neurological: Negative.   All other systems reviewed and are negative.  Physical Exam Updated Vital Signs BP 133/89   Pulse 86   Temp 98.9 F (37.2 C) (Oral)   Resp 14   SpO2 100%   Physical Exam Vitals and nursing note reviewed.  Constitutional:      General: He is not in acute distress.    Appearance: He is well-developed. He is not ill-appearing, toxic-appearing or diaphoretic.     Comments: Appear pale however family states is at baseline  HENT:     Head: Normocephalic and atraumatic.     Comments: Nontender, no hematoma, no battle sign, raccoon eyes    Right Ear: Tympanic  membrane, ear canal and external ear normal. There is no impacted cerumen.     Left Ear: Tympanic membrane, ear canal and external ear normal. There is no impacted cerumen.     Ears:     Comments: No hemotympanum     Nose: Nose normal. No congestion or rhinorrhea.     Comments: No septal hematoma    Mouth/Throat:     Mouth: Mucous membranes are moist.     Comments: Poor dentition, no loose teeth Eyes:     Pupils: Pupils are equal, round, and reactive to light.  Neck:     Trachea: Trachea and phonation normal.  Cardiovascular:     Rate and Rhythm: Normal rate and regular rhythm.     Pulses: Normal pulses.          Radial  pulses are 2+ on the right side and 2+ on the left side.       Dorsalis pedis pulses are 2+ on the right side and 2+ on the left side.     Heart sounds: Normal heart sounds.  Pulmonary:     Effort: Pulmonary effort is normal. No respiratory distress.     Breath sounds: Normal breath sounds.     Comments: Clear, bl speaks in full sentences without difficulty Chest:       Comments: Tenderness to anterior posterior left chest wall.  Some mild point ecchymosis however no crepitus, step-off Abdominal:     General: Bowel sounds are normal. There is no distension.     Palpations: Abdomen is soft.     Tenderness: There is abdominal tenderness in the epigastric area and left upper quadrant. There is no right CVA tenderness, left CVA tenderness, guarding or rebound. Negative signs include Murphy's sign and McBurney's sign.     Hernia: No hernia is present.     Comments: Tenderness to epigastric region, old ecchymosis to left lower chest wall  Musculoskeletal:        General: Normal range of motion.     Cervical back: Full passive range of motion without pain, normal range of motion and neck supple. No tenderness.     Comments: Tenderness to left midshaft, proximal humerus with overlying ecchymosis, swelling.  Compartments are soft.  Able to flex and extend at left elbow.   Nontender forearm, bilateral hands.  Able to lift right arm overhead.  Diffuse tenderness to midline C/T/L without any overlying step-off.  His pelvis is stable, nontender palpation.  Nontender bilateral femur.  Diffuse tenderness of bilateral knees were able to flex and extend.  He has moderate ecchymosis to right knee, midshaft, proximal tib-fib on right.  Diffuse tenderness to bilateral dorsal aspect of feet.  Feet:     Comments: Diffuse tenderness to bilateral feet however able to plantarflex, dorsiflex, wiggle toes without difficulty no overlying skin changes. Lymphadenopathy:     Cervical: No cervical adenopathy.  Skin:    General: Skin is warm and dry.     Capillary Refill: Capillary refill takes less than 2 seconds.     Comments: Diffuse areas of ecchymosis in varying stages throughout body.  No lacerations to suture.  No breaks in skin  Neurological:     General: No focal deficit present.     Mental Status: He is alert and oriented to person, place, and time.     Cranial Nerves: Cranial nerves are intact.     Sensory: Sensation is intact.     Motor: Motor function is intact.     Comments: CN 2-12 grossly intact Equal strength Intact sensation    ED Results / Procedures / Treatments   Labs (all labs ordered are listed, but only abnormal results are displayed) Labs Reviewed  BASIC METABOLIC PANEL - Abnormal; Notable for the following components:      Result Value   Potassium 3.4 (*)    Glucose, Bld 101 (*)    All other components within normal limits  CBC WITH DIFFERENTIAL/PLATELET - Abnormal; Notable for the following components:   WBC 11.2 (*)    RBC 4.08 (*)    Hemoglobin 12.1 (*)    HCT 37.3 (*)    Neutro Abs 8.8 (*)    All other components within normal limits  LACTIC ACID, PLASMA - Abnormal; Notable for the following components:   Lactic Acid, Venous 2.0 (*)  All other components within normal limits  HEPATIC FUNCTION PANEL - Abnormal; Notable for the following  components:   Total Protein 6.3 (*)    AST 104 (*)    Alkaline Phosphatase 210 (*)    Total Bilirubin 1.8 (*)    Bilirubin, Direct 0.8 (*)    Indirect Bilirubin 1.0 (*)    All other components within normal limits  RESP PANEL BY RT-PCR (FLU A&B, COVID) [id number]  LACTIC ACID, PLASMA  LIPASE, BLOOD  HEPATITIS PANEL, ACUTE  TROPONIN I (HIGH SENSITIVITY)  TROPONIN I (HIGH SENSITIVITY)    EKG EKG Interpretation  Date/Time:  Sunday December 04 2020 19:52:31 EDT Ventricular Rate:  88 PR Interval:  51 QRS Duration: 92 QT Interval:  372 QTC Calculation: 451 R Axis:   81 Text Interpretation: Sinus rhythm Short PR interval Right atrial enlargement Artifact in lead(s) I II aVR aVL aVF Poor data quality in current ECG precludes serial comparison Confirmed by Addison Lank [phone]) on 12/04/2020 11:13:57 PM  Radiology DG Ribs Unilateral W/Chest Left  Result Date: 12/04/2020 CLINICAL DATA:  Fall off ladder EXAM: LEFT RIBS AND CHEST - 3+ VIEW COMPARISON:  None. FINDINGS: Lungs are clear.  No pleural effusion or pneumothorax. The heart is normal in size. No displaced left rib fracture is seen. Left humerus fracture described on dedicated radiographs. IMPRESSION: No evidence of acute cardiopulmonary disease. No displaced left rib fracture is seen. Electronically Signed   By: Julian Hy M.D.   On: 12/04/2020 19:06   DG Tibia/Fibula Right  Result Date: 12/04/2020 CLINICAL DATA:  Fall, pain EXAM: RIGHT TIBIA AND FIBULA - 2 VIEW COMPARISON:  None. FINDINGS: No fracture or dislocation is seen. The joint spaces are preserved. Visualized soft tissues are within normal limits. IMPRESSION: Negative. Electronically Signed   By: Julian Hy M.D.   On: 12/04/2020 20:49   CT Head Wo Contrast  Result Date: 12/04/2020 CLINICAL DATA:  Syncope, fall off ladder EXAM: CT HEAD WITHOUT CONTRAST CT CERVICAL SPINE WITHOUT CONTRAST TECHNIQUE: Multidetector CT imaging of the head and cervical spine was  performed following the standard protocol without intravenous contrast. Multiplanar CT image reconstructions of the cervical spine were also generated. COMPARISON:  CT head dated 09/03/2015 FINDINGS: CT HEAD FINDINGS Brain: No evidence of acute infarction, hemorrhage, hydrocephalus, extra-axial collection or mass lesion/mass effect. Vascular: No hyperdense vessel or unexpected calcification. Skull: Normal. Negative for fracture or focal lesion. Sinuses/Orbits: The visualized paranasal sinuses are essentially clear. The mastoid air cells are unopacified. Other: None. CT CERVICAL SPINE FINDINGS Alignment: Normal cervical lordosis. Skull base and vertebrae: No acute fracture. No primary bone lesion or focal pathologic process. Soft tissues and spinal canal: No prevertebral fluid or swelling. No visible canal hematoma. Disc levels: Mild superior endplate changes at T1 and T2, possibly chronic. Vertebral body heights and intervertebral disc spaces are otherwise maintained. Spinal canal is patent. Upper chest: Visualized lung apices are clear. Other: Visualized thyroid is unremarkable. IMPRESSION: Normal head CT. No evidence of traumatic injury to the cervical spine. Electronically Signed   By: Julian Hy M.D.   On: 12/04/2020 19:15   CT Cervical Spine Wo Contrast  Result Date: 12/04/2020 CLINICAL DATA:  Syncope, fall off ladder EXAM: CT HEAD WITHOUT CONTRAST CT CERVICAL SPINE WITHOUT CONTRAST TECHNIQUE: Multidetector CT imaging of the head and cervical spine was performed following the standard protocol without intravenous contrast. Multiplanar CT image reconstructions of the cervical spine were also generated. COMPARISON:  CT head dated 09/03/2015 FINDINGS: CT HEAD FINDINGS  Brain: No evidence of acute infarction, hemorrhage, hydrocephalus, extra-axial collection or mass lesion/mass effect. Vascular: No hyperdense vessel or unexpected calcification. Skull: Normal. Negative for fracture or focal lesion.  Sinuses/Orbits: The visualized paranasal sinuses are essentially clear. The mastoid air cells are unopacified. Other: None. CT CERVICAL SPINE FINDINGS Alignment: Normal cervical lordosis. Skull base and vertebrae: No acute fracture. No primary bone lesion or focal pathologic process. Soft tissues and spinal canal: No prevertebral fluid or swelling. No visible canal hematoma. Disc levels: Mild superior endplate changes at T1 and T2, possibly chronic. Vertebral body heights and intervertebral disc spaces are otherwise maintained. Spinal canal is patent. Upper chest: Visualized lung apices are clear. Other: Visualized thyroid is unremarkable. IMPRESSION: Normal head CT. No evidence of traumatic injury to the cervical spine. Electronically Signed   By: Julian Hy M.D.   On: 12/04/2020 19:15   CT SHOULDER LEFT WO CONTRAST  Result Date: 12/04/2020 CLINICAL DATA:  Shoulder fracture plain films today EXAM: CT OF THE UPPER LEFT EXTREMITY WITHOUT CONTRAST TECHNIQUE: Multidetector CT imaging of the upper left extremity was performed according to the standard protocol. COMPARISON:  Plain films today FINDINGS: Bones/Joint/Cartilage There is a comminuted fracture noted through the humeral neck. Medial and anterior displacement of the humeral shaft relative to the humeral head. Fracture also involves the superolateral humeral head/greater tuberosity region. No subluxation or dislocation. Ligaments Suboptimally assessed by CT. Muscles and Tendons Grossly unremarkable Soft tissues Soft tissue swelling. IMPRESSION: Displaced comminuted left humeral neck fracture. Involvement of the humeral head in the region of the greater tuberosity. Electronically Signed   By: Rolm Baptise M.D.   On: 12/04/2020 22:56   CT T-SPINE NO CHARGE  Result Date: 12/04/2020 CLINICAL DATA:  Fall from ladder EXAM: CT Thoracic and Lumbar spine without contrast TECHNIQUE: Multiplanar CT images of the thoracic and lumbar spine were reconstructed  from contemporary CT of the Chest, Abdomen, and Pelvis CONTRAST:  No additional contrast COMPARISON:  None FINDINGS: CT THORACIC SPINE FINDINGS Alignment: Normal. Vertebrae: No acute fracture or focal pathologic process. Disc levels: No spinal canal or neural foraminal stenosis CT LUMBAR SPINE FINDINGS Segmentation: 5 lumbar type vertebrae. Alignment: Normal. Vertebrae: No acute fracture or focal pathologic process. Disc levels: No spinal canal or neural foraminal stenosis IMPRESSION: No acute fracture or static subluxation of the thoracic or lumbar spine. Electronically Signed   By: Ulyses Jarred M.D.   On: 12/04/2020 21:37   CT L-SPINE NO CHARGE  Result Date: 12/04/2020 CLINICAL DATA:  Fall from ladder EXAM: CT Thoracic and Lumbar spine without contrast TECHNIQUE: Multiplanar CT images of the thoracic and lumbar spine were reconstructed from contemporary CT of the Chest, Abdomen, and Pelvis CONTRAST:  No additional contrast COMPARISON:  None FINDINGS: CT THORACIC SPINE FINDINGS Alignment: Normal. Vertebrae: No acute fracture or focal pathologic process. Disc levels: No spinal canal or neural foraminal stenosis CT LUMBAR SPINE FINDINGS Segmentation: 5 lumbar type vertebrae. Alignment: Normal. Vertebrae: No acute fracture or focal pathologic process. Disc levels: No spinal canal or neural foraminal stenosis IMPRESSION: No acute fracture or static subluxation of the thoracic or lumbar spine. Electronically Signed   By: Ulyses Jarred M.D.   On: 12/04/2020 21:37   DG Shoulder Left  Result Date: 12/04/2020 CLINICAL DATA:  Fall off ladder EXAM: LEFT SHOULDER - 2+ VIEW COMPARISON:  None. FINDINGS: Displaced transverse left humeral neck fracture, mildly comminuted, with 1 shaft width medial displacement. Suspected impaction fracture of the posterolateral humeral head. Inferior subluxation of the humeral  head likely reflects hemarthrosis. Visualized left lung is clear. IMPRESSION: Comminuted, displaced humeral  neck fracture, as above. Suspected impaction fracture of the posterolateral humeral head. Electronically Signed   By: Julian Hy M.D.   On: 12/04/2020 19:05   DG Knee Complete 4 Views Left  Result Date: 12/04/2020 CLINICAL DATA:  Fall off ladder EXAM: LEFT KNEE - COMPLETE 4+ VIEW COMPARISON:  None. FINDINGS: No fracture or dislocation is seen. The joint spaces are preserved. Visualized soft tissues are within normal limits. No suprapatellar knee joint effusion. IMPRESSION: Negative. Electronically Signed   By: Julian Hy M.D.   On: 12/04/2020 19:11   DG Knee Complete 4 Views Right  Result Date: 12/04/2020 CLINICAL DATA:  Fall off ladder EXAM: RIGHT KNEE - COMPLETE 4+ VIEW COMPARISON:  None. FINDINGS: No fracture or dislocation is seen. The joint spaces are preserved. Visualized soft tissues are within normal limits. No suprapatellar knee joint effusion. IMPRESSION: Negative. Electronically Signed   By: Julian Hy M.D.   On: 12/04/2020 19:10   DG Humerus Left  Result Date: 12/04/2020 CLINICAL DATA:  Fall off ladder EXAM: LEFT HUMERUS - 2+ VIEW COMPARISON:  None. FINDINGS: Proximal humeral head/neck fractures, previously described on left shoulder radiographs. No distal fracture is seen. IMPRESSION: Proximal humeral head/neck fractures, previously described on left shoulder radiographs. Electronically Signed   By: Julian Hy M.D.   On: 12/04/2020 19:11   DG Foot Complete Left  Result Date: 12/04/2020 CLINICAL DATA:  Fall, pain EXAM: LEFT FOOT - COMPLETE 3+ VIEW COMPARISON:  None. FINDINGS: No fracture or dislocation is seen. The joint spaces are preserved. Visualized soft tissues are within normal limits. IMPRESSION: Negative. Electronically Signed   By: Julian Hy M.D.   On: 12/04/2020 20:50   DG Foot Complete Right  Result Date: 12/04/2020 CLINICAL DATA:  Fall, pain EXAM: RIGHT FOOT COMPLETE - 3+ VIEW COMPARISON:  None. FINDINGS: No fracture or dislocation  is seen. The joint spaces are preserved. The visualized soft tissues are unremarkable. IMPRESSION: Negative. Electronically Signed   By: Julian Hy M.D.   On: 12/04/2020 20:50   CT CHEST ABDOMEN PELVIS WO CONTRAST  Result Date: 12/04/2020 CLINICAL DATA:  Fall from ladder EXAM: CT CHEST, ABDOMEN AND PELVIS WITHOUT CONTRAST TECHNIQUE: Multidetector CT imaging of the chest, abdomen and pelvis was performed following the standard protocol without IV contrast. COMPARISON:  None. FINDINGS: CT CHEST FINDINGS Cardiovascular: Heart size is normal without pericardial effusion. The thoracic aorta is normal in course and caliber without dissection, aneurysm, ulceration or intramural hematoma. Mediastinum/Nodes: No mediastinal hematoma. No mediastinal, hilar or axillary lymphadenopathy. The visualized thyroid and thoracic esophageal course are unremarkable. Lungs/Pleura: There is opacity in the right middle lobe, likely pulmonary contusion. No pleural effusion or pneumothorax. Musculoskeletal: There are fractures of the anterior right third through seventh ribs CT ABDOMEN PELVIS FINDINGS Hepatobiliary: No hepatic hematoma or laceration. No biliary dilatation. Normal gallbladder. Pancreas: Normal contours without ductal dilatation. No peripancreatic fluid collection. Spleen: No splenic laceration or hematoma. Adrenals/Urinary Tract: --Adrenal glands: No adrenal hemorrhage. --Right kidney/ureter: No hydronephrosis or perinephric hematoma. --Left kidney/ureter: 2 mm nonobstructive left renal calculus --Urinary bladder: Unremarkable. Stomach/Bowel: --Stomach/Duodenum: No hiatal hernia or other gastric abnormality. Normal duodenal course and caliber. --Small bowel: No dilatation or inflammation. --Colon: No focal abnormality. --Appendix: Normal. Vascular/Lymphatic: Normal course and caliber of the major abdominal vessels. No abdominal or pelvic lymphadenopathy. Reproductive: Normal prostate and seminal vesicles.  Musculoskeletal. No pelvic fractures. Other: None. IMPRESSION: 1. Fractures of the  anterior right third through seventh ribs with associated right middle lobe pulmonary contusion. No pneumothorax or pleural effusion. 2. Nonobstructive left renal calculus measuring 2 mm. Electronically Signed   By: Ulyses Jarred M.D.   On: 12/04/2020 21:44    Procedures .Splint Application  Date/Time: 12/04/2020 8:29 PM Performed by: Shelby Dubin A, PA-C Authorized by: Nettie Elm, PA-C   Consent:    Consent obtained:  Verbal   Consent given by:  Patient   Risks, benefits, and alternatives were discussed: yes     Risks discussed:  Discoloration, numbness, pain and swelling   Alternatives discussed:  No treatment, delayed treatment, alternative treatment, observation and referral Universal protocol:    Procedure explained and questions answered to patient or proxy's satisfaction: yes     Relevant documents present and verified: yes     Test results available: yes     Imaging studies available: yes     Required blood products, implants, devices, and special equipment available: yes     Site/side marked: yes     Immediately prior to procedure a time out was called: yes     Patient identity confirmed:  Verbally with patient Pre-procedure details:    Distal neurologic exam:  Normal   Distal perfusion: distal pulses strong and brisk capillary refill   Procedure details:    Location:  Arm   Arm location:  L upper arm   Strapping: yes     Supplies:  Sling   Attestation: Splint applied and adjusted personally by me   Post-procedure details:    Distal neurologic exam:  Normal   Distal perfusion: distal pulses strong and brisk capillary refill     Procedure completion:  Tolerated well, no immediate complications   Post-procedure imaging: not applicable   .Critical Care Performed by: Nettie Elm, PA-C Authorized by: Nettie Elm, PA-C   Critical care provider statement:     Critical care time (minutes):  31   Critical care was necessary to treat or prevent imminent or life-threatening deterioration of the following conditions:  Trauma   Critical care was time spent personally by me on the following activities:  Obtaining history from patient or surrogate, examination of patient, evaluation of patient's response to treatment, discussions with primary provider, discussions with consultants, development of treatment plan with patient or surrogate, blood draw for specimens, ordering and performing treatments and interventions, ordering and review of laboratory studies, ordering and review of radiographic studies, re-evaluation of patient's condition and review of old charts   Care discussed with: admitting provider     Medications Ordered in ED Medications  sodium chloride 0.9 % bolus 1,000 mL (1,000 mLs Intravenous New Bag/Given 12/04/20 2234)  fentaNYL (SUBLIMAZE) injection 50 mcg (50 mcg Intravenous Given 12/04/20 2031)  HYDROmorphone (DILAUDID) injection 1 mg (1 mg Intravenous Given 12/04/20 2238)   ED Course  I have reviewed the triage vital signs and the nursing notes.  Pertinent labs & imaging results that were available during my care of the patient were reviewed by me and considered in my medical decision making (see chart for details).  Here for evaluation mechanical fall which occurred yesterday.  He has obvious injury to left proximal, mid humerus however he is neurovascularly intact without evidence of compartment syndrome currently. Tenderness to left chest wall, abdomen.  Has been ambulatory since the fall.  He is not chronically anticoagulated.  Of note patient did have a fall 3 to 4 days ago with possible LOC.  Mother  states patient was breathing however due to him being unresponsive for approximately 2 to 3 minutes she attempted to chest compressions. Mother does state that he was breathing the entire time.  Low suspicion for acute cardiac event, likely  just from the fall.  He had no postictal state, shaking, trauma or urinary incontinence, suspicion for seizure-like activity. No syncopal event.  Labs and imaging personally reviewed and interpreted:  CBC leukocytosis at 11.2, hemoglobin 12.1, similar to prior Metabolic panel potassium 3.4 Lactic 2.0 Troponin 4 Lipase 21 Hepatic function panel mild elevated in bili and alk phos. Non tender RUQ, CT WO abd pathology. Wills end of hepatitis panel given hx of IVDU. Low suspicion for cholecystitis, choledocholithiasis or cholangitis EKG CT head without acute abnormality CT cervical without acute abnormality Left humerus with comminuted fracture, will place in splint CT Chest with multiple rib fractures and pulmonary contusions   Patient reassessed. Splint placed per ortho tech. NV intact after placement  Clear patient's rib fractures came from the fall versus the mother attempting to do chest compressions however story does not sound consistent with cardiac event.  CONSULT with Dr. Kieth Brightly with trauma surgery.  Difficult to manage his pain, suspect this is likely due to his prior substance use.  Trauma will assess and admit  CONSULT with orthopedics Dr. Sammuel Hines with Ortho who will see patient in consult while patient admitted. Rec CT for pre op planning.   Patient critically ill with multiple rib fractures, left humerus fx which will req surgical management. Plan on admission to trauma service.  Patient seen evaluated by attending, Dr. Matilde Sprang who agrees above treatment, plan and disposition    MDM Rules/Calculators/A&P                            Final Clinical Impression(s) / ED Diagnoses Final diagnoses:  Trauma  Fall  Other closed displaced fracture of proximal end of left humerus, initial encounter  Closed fracture of multiple ribs of right side, initial encounter  Contusion of right lung, initial encounter    Rx / DC Orders ED Discharge Orders     None         Dari Carpenito A, PA-C 12/04/20 2317    Kommor, Debe Coder, MD 12/05/20 2352

## 2020-12-04 NOTE — ED Notes (Signed)
Patient transported to CT 

## 2020-12-04 NOTE — ED Triage Notes (Signed)
Pt states he fell approx 6 feet off a ladder yesterday and has L arm pain and generalized pain all over.  Denies hitting his head but does report + LOC.  States he also fell a few days prior to that and his mom did CPR.

## 2020-12-04 NOTE — H&P (Signed)
Activation and Reason: consult, fall from ladder  [name] is an 40 y.o. male.  HPI: 40 yo male fell from ladder lastnight when cutting down some bamboo in the yard. He complains of pain in his left chest and left arm/shoulder. Pain is intense, 8/10, constant, pressure like, worse with movement and better with rest.  He also had an episode 4 days ago where he was nonresponsive and his mother did CPR but no further healthcare treatment was necessary.  He has a history of substance abuse and is on suboxone. He had a pneumonia in 2017 requiring intubation with a "collapsed" lung. He has PTSD from this and associates many health issues to that hospitalization.  Past Medical History:  Diagnosis Date   Anxiety    Benzodiazepine abuse (HCC)    Chronic tachycardia    Per Dr. Doreene Eland (psych)   Depression    Drug use    History of IBS    Polysubstance abuse (HCC)     History reviewed. No pertinent surgical history.  No family history on file.  Social History:  reports that he has been smoking cigarettes. He has been smoking an average of .5 packs per day. He has never used smokeless tobacco. He reports current drug use. Drugs: IV, Cocaine, Marijuana, Amphetamines, Hydrocodone, and Oxycodone. He reports that he does not drink alcohol.  Allergies:  Allergies  Allergen Reactions   Compazine [Prochlorperazine Edisylate] Other (See Comments)    Blood pressure drops   Haldol [Haloperidol]     Restless legs   Nsaids Other (See Comments)    unknown   Phenergan [Promethazine Hcl] Other (See Comments)    Blood pressure drops    Reglan [Metoclopramide]     Restless legs   Tramadol Other (See Comments)    Blood pressure drops     Medications: I have reviewed the patient's current medications.  Results for orders placed or performed during the hospital encounter of 12/04/20 (from the past 48 hour(s))  Basic metabolic panel     Status: Abnormal   Collection Time: 12/04/20  5:22  PM  Result Value Ref Range   Sodium 138 135 - 145 mmol/L   Potassium 3.4 (L) 3.5 - 5.1 mmol/L   Chloride 100 98 - 111 mmol/L   CO2 28 22 - 32 mmol/L   Glucose, Bld 101 (H) 70 - 99 mg/dL    Comment: Glucose reference range applies only to samples taken after fasting for at least 8 hours.   BUN 11 6 - 20 mg/dL   Creatinine, Ser 7.67 0.61 - 1.24 mg/dL   Calcium 8.9 8.9 - 34.1 mg/dL   GFR, Estimated >93 >79 mL/min    Comment: (NOTE) Calculated using the CKD-EPI Creatinine Equation (2021)    Anion gap 10 5 - 15    Comment: Performed at [facility], [address], [place], [place] [address]  CBC with Differential     Status: Abnormal   Collection Time: 12/04/20  5:22 PM  Result Value Ref Range   WBC 11.2 (H) 4.0 - 10.5 K/uL   RBC 4.08 (L) 4.22 - 5.81 MIL/uL   Hemoglobin 12.1 (L) 13.0 - 17.0 g/dL   HCT 73.5 (L) 32.9 - 92.4 %   MCV 91.4 80.0 - 100.0 fL   MCH 29.7 26.0 - 34.0 pg   MCHC 32.4 30.0 - 36.0 g/dL   RDW 26.8 34.1 - 96.2 %   Platelets 330 150 - 400 K/uL   nRBC  0.0 0.0 - 0.2 %   Neutrophils Relative % 79 %   Neutro Abs 8.8 (H) 1.7 - 7.7 K/uL   Lymphocytes Relative 15 %   Lymphs Abs 1.6 0.7 - 4.0 K/uL   Monocytes Relative 6 %   Monocytes Absolute 0.7 0.1 - 1.0 K/uL   Eosinophils Relative 0 %   Eosinophils Absolute 0.0 0.0 - 0.5 K/uL   Basophils Relative 0 %   Basophils Absolute 0.0 0.0 - 0.1 K/uL   Immature Granulocytes 0 %   Abs Immature Granulocytes 0.03 0.00 - 0.07 K/uL    Comment: Performed at [facility], [address], [place], [place] [address]  Lactic acid, plasma     Status: Abnormal   Collection Time: 12/04/20  8:08 PM  Result Value Ref Range   Lactic Acid, Venous 2.0 (HH) 0.5 - 1.9 mmol/L    Comment: CRITICAL RESULT CALLED TO, READ BACK BY AND VERIFIED WITH: Rennis Chris RN 12/04/20 2112 Enid Derry Performed at [facility], [address] [place], [place] [address]   Troponin I (High Sensitivity)     Status: None   Collection  Time: 12/04/20  8:08 PM  Result Value Ref Range   Troponin I (High Sensitivity) 4 <18 ng/L    Comment: (NOTE) Elevated high sensitivity troponin I (hsTnI) values and significant  changes across serial measurements may suggest ACS but many other  chronic and acute conditions are known to elevate hsTnI results.  Refer to the "Links" section for chest pain algorithms and additional  guidance. Performed at [facility], [address], [place], [place] [address]   Hepatic function panel     Status: Abnormal   Collection Time: 12/04/20  8:08 PM  Result Value Ref Range   Total Protein 6.3 (L) 6.5 - 8.1 g/dL   Albumin 3.5 3.5 - 5.0 g/dL   AST 440 (H) 15 - 41 U/L   ALT 42 0 - 44 U/L   Alkaline Phosphatase 210 (H) 38 - 126 U/L   Total Bilirubin 1.8 (H) 0.3 - 1.2 mg/dL   Bilirubin, Direct 0.8 (H) 0.0 - 0.2 mg/dL   Indirect Bilirubin 1.0 (H) 0.3 - 0.9 mg/dL    Comment: Performed at [facility], [address] [place], [place] [address]  Lipase, blood     Status: None   Collection Time: 12/04/20  8:08 PM  Result Value Ref Range   Lipase 21 11 - 51 U/L    Comment: Performed at [facility], [address], [place], [place] [address]  Lactic acid, plasma     Status: None   Collection Time: 12/04/20  9:44 PM  Result Value Ref Range   Lactic Acid, Venous 1.5 0.5 - 1.9 mmol/L    Comment: Performed at [facility], [address] [place], [place] [address]  Troponin I (High Sensitivity)     Status: None   Collection Time: 12/04/20  9:44 PM  Result Value Ref Range   Troponin I (High Sensitivity) 6 <18 ng/L    Comment: (NOTE) Elevated high sensitivity troponin I (hsTnI) values and significant  changes across serial measurements may suggest ACS but many other  chronic and acute conditions are known to elevate hsTnI results.  Refer to the "Links" section for chest pain algorithms and additional  guidance. Performed at [facility], [address]  [place], [place] [address]     DG Ribs Unilateral W/Chest Left  Result Date: 12/04/2020 CLINICAL DATA:  Fall off ladder EXAM: LEFT RIBS AND CHEST - 3+ VIEW COMPARISON:  None. FINDINGS: Lungs are clear.  No pleural effusion or pneumothorax. The heart is normal in size. No displaced left rib fracture is seen. Left humerus fracture described on dedicated radiographs. IMPRESSION: No evidence of acute cardiopulmonary disease. No displaced left rib fracture is seen. Electronically Signed   By: Charline Bills M.D.   On: 12/04/2020 19:06   DG Tibia/Fibula Right  Result Date: 12/04/2020 CLINICAL DATA:  Fall, pain EXAM: RIGHT TIBIA AND FIBULA - 2 VIEW COMPARISON:  None. FINDINGS: No fracture or dislocation is seen. The joint spaces are preserved. Visualized soft tissues are within normal limits. IMPRESSION: Negative. Electronically Signed   By: Charline Bills M.D.   On: 12/04/2020 20:49   CT Head Wo Contrast  Result Date: 12/04/2020 CLINICAL DATA:  Syncope, fall off ladder EXAM: CT HEAD WITHOUT CONTRAST CT CERVICAL SPINE WITHOUT CONTRAST TECHNIQUE: Multidetector CT imaging of the head and cervical spine was performed following the standard protocol without intravenous contrast. Multiplanar CT image reconstructions of the cervical spine were also generated. COMPARISON:  CT head dated 09/03/2015 FINDINGS: CT HEAD FINDINGS Brain: No evidence of acute infarction, hemorrhage, hydrocephalus, extra-axial collection or mass lesion/mass effect. Vascular: No hyperdense vessel or unexpected calcification. Skull: Normal. Negative for fracture or focal lesion. Sinuses/Orbits: The visualized paranasal sinuses are essentially clear. The mastoid air cells are unopacified. Other: None. CT CERVICAL SPINE FINDINGS Alignment: Normal cervical lordosis. Skull base and vertebrae: No acute fracture. No primary bone lesion or focal pathologic process. Soft tissues and spinal canal: No prevertebral fluid or swelling. No visible canal  hematoma. Disc levels: Mild superior endplate changes at T1 and T2, possibly chronic. Vertebral body heights and intervertebral disc spaces are otherwise maintained. Spinal canal is patent. Upper chest: Visualized lung apices are clear. Other: Visualized thyroid is unremarkable. IMPRESSION: Normal head CT. No evidence of traumatic injury to the cervical spine. Electronically Signed   By: Charline Bills M.D.   On: 12/04/2020 19:15   CT Cervical Spine Wo Contrast  Result Date: 12/04/2020 CLINICAL DATA:  Syncope, fall off ladder EXAM: CT HEAD WITHOUT CONTRAST CT CERVICAL SPINE WITHOUT CONTRAST TECHNIQUE: Multidetector CT imaging of the head and cervical spine was performed following the standard protocol without intravenous contrast. Multiplanar CT image reconstructions of the cervical spine were also generated. COMPARISON:  CT head dated 09/03/2015 FINDINGS: CT HEAD FINDINGS Brain: No evidence of acute infarction, hemorrhage, hydrocephalus, extra-axial collection or mass lesion/mass effect. Vascular: No hyperdense vessel or unexpected calcification. Skull: Normal. Negative for fracture or focal lesion. Sinuses/Orbits: The visualized paranasal sinuses are essentially clear. The mastoid air cells are unopacified. Other: None. CT CERVICAL SPINE FINDINGS Alignment: Normal cervical lordosis. Skull base and vertebrae: No acute fracture. No primary bone lesion or focal pathologic process. Soft tissues and spinal canal: No prevertebral fluid or swelling. No visible canal hematoma. Disc levels: Mild superior endplate changes at T1 and T2, possibly chronic. Vertebral body heights and intervertebral disc spaces are otherwise maintained. Spinal canal is patent. Upper chest: Visualized lung apices are clear. Other: Visualized thyroid is unremarkable. IMPRESSION: Normal head CT. No evidence of traumatic injury to the cervical spine. Electronically Signed   By: Charline Bills M.D.   On: 12/04/2020 19:15   CT SHOULDER  LEFT WO CONTRAST  Result Date: 12/04/2020 CLINICAL DATA:  Shoulder fracture plain films today EXAM: CT OF THE UPPER LEFT EXTREMITY WITHOUT CONTRAST TECHNIQUE: Multidetector CT  imaging of the upper left extremity was performed according to the standard protocol. COMPARISON:  Plain films today FINDINGS: Bones/Joint/Cartilage There is a comminuted fracture noted through the humeral neck. Medial and anterior displacement of the humeral shaft relative to the humeral head. Fracture also involves the superolateral humeral head/greater tuberosity region. No subluxation or dislocation. Ligaments Suboptimally assessed by CT. Muscles and Tendons Grossly unremarkable Soft tissues Soft tissue swelling. IMPRESSION: Displaced comminuted left humeral neck fracture. Involvement of the humeral head in the region of the greater tuberosity. Electronically Signed   By: Charlett Nose M.D.   On: 12/04/2020 22:56   CT T-SPINE NO CHARGE  Result Date: 12/04/2020 CLINICAL DATA:  Fall from ladder EXAM: CT Thoracic and Lumbar spine without contrast TECHNIQUE: Multiplanar CT images of the thoracic and lumbar spine were reconstructed from contemporary CT of the Chest, Abdomen, and Pelvis CONTRAST:  No additional contrast COMPARISON:  None FINDINGS: CT THORACIC SPINE FINDINGS Alignment: Normal. Vertebrae: No acute fracture or focal pathologic process. Disc levels: No spinal canal or neural foraminal stenosis CT LUMBAR SPINE FINDINGS Segmentation: 5 lumbar type vertebrae. Alignment: Normal. Vertebrae: No acute fracture or focal pathologic process. Disc levels: No spinal canal or neural foraminal stenosis IMPRESSION: No acute fracture or static subluxation of the thoracic or lumbar spine. Electronically Signed   By: Deatra Robinson M.D.   On: 12/04/2020 21:37   CT L-SPINE NO CHARGE  Result Date: 12/04/2020 CLINICAL DATA:  Fall from ladder EXAM: CT Thoracic and Lumbar spine without contrast TECHNIQUE: Multiplanar CT images of the thoracic  and lumbar spine were reconstructed from contemporary CT of the Chest, Abdomen, and Pelvis CONTRAST:  No additional contrast COMPARISON:  None FINDINGS: CT THORACIC SPINE FINDINGS Alignment: Normal. Vertebrae: No acute fracture or focal pathologic process. Disc levels: No spinal canal or neural foraminal stenosis CT LUMBAR SPINE FINDINGS Segmentation: 5 lumbar type vertebrae. Alignment: Normal. Vertebrae: No acute fracture or focal pathologic process. Disc levels: No spinal canal or neural foraminal stenosis IMPRESSION: No acute fracture or static subluxation of the thoracic or lumbar spine. Electronically Signed   By: Deatra Robinson M.D.   On: 12/04/2020 21:37   DG Shoulder Left  Result Date: 12/04/2020 CLINICAL DATA:  Fall off ladder EXAM: LEFT SHOULDER - 2+ VIEW COMPARISON:  None. FINDINGS: Displaced transverse left humeral neck fracture, mildly comminuted, with 1 shaft width medial displacement. Suspected impaction fracture of the posterolateral humeral head. Inferior subluxation of the humeral head likely reflects hemarthrosis. Visualized left lung is clear. IMPRESSION: Comminuted, displaced humeral neck fracture, as above. Suspected impaction fracture of the posterolateral humeral head. Electronically Signed   By: Charline Bills M.D.   On: 12/04/2020 19:05   DG Knee Complete 4 Views Left  Result Date: 12/04/2020 CLINICAL DATA:  Fall off ladder EXAM: LEFT KNEE - COMPLETE 4+ VIEW COMPARISON:  None. FINDINGS: No fracture or dislocation is seen. The joint spaces are preserved. Visualized soft tissues are within normal limits. No suprapatellar knee joint effusion. IMPRESSION: Negative. Electronically Signed   By: Charline Bills M.D.   On: 12/04/2020 19:11   DG Knee Complete 4 Views Right  Result Date: 12/04/2020 CLINICAL DATA:  Fall off ladder EXAM: RIGHT KNEE - COMPLETE 4+ VIEW COMPARISON:  None. FINDINGS: No fracture or dislocation is seen. The joint spaces are preserved. Visualized soft  tissues are within normal limits. No suprapatellar knee joint effusion. IMPRESSION: Negative. Electronically Signed   By: Charline Bills M.D.   On: 12/04/2020 19:10   DG  Humerus Left  Result Date: 12/04/2020 CLINICAL DATA:  Fall off ladder EXAM: LEFT HUMERUS - 2+ VIEW COMPARISON:  None. FINDINGS: Proximal humeral head/neck fractures, previously described on left shoulder radiographs. No distal fracture is seen. IMPRESSION: Proximal humeral head/neck fractures, previously described on left shoulder radiographs. Electronically Signed   By: Charline Bills M.D.   On: 12/04/2020 19:11   DG Foot Complete Left  Result Date: 12/04/2020 CLINICAL DATA:  Fall, pain EXAM: LEFT FOOT - COMPLETE 3+ VIEW COMPARISON:  None. FINDINGS: No fracture or dislocation is seen. The joint spaces are preserved. Visualized soft tissues are within normal limits. IMPRESSION: Negative. Electronically Signed   By: Charline Bills M.D.   On: 12/04/2020 20:50   DG Foot Complete Right  Result Date: 12/04/2020 CLINICAL DATA:  Fall, pain EXAM: RIGHT FOOT COMPLETE - 3+ VIEW COMPARISON:  None. FINDINGS: No fracture or dislocation is seen. The joint spaces are preserved. The visualized soft tissues are unremarkable. IMPRESSION: Negative. Electronically Signed   By: Charline Bills M.D.   On: 12/04/2020 20:50   CT CHEST ABDOMEN PELVIS WO CONTRAST  Result Date: 12/04/2020 CLINICAL DATA:  Fall from ladder EXAM: CT CHEST, ABDOMEN AND PELVIS WITHOUT CONTRAST TECHNIQUE: Multidetector CT imaging of the chest, abdomen and pelvis was performed following the standard protocol without IV contrast. COMPARISON:  None. FINDINGS: CT CHEST FINDINGS Cardiovascular: Heart size is normal without pericardial effusion. The thoracic aorta is normal in course and caliber without dissection, aneurysm, ulceration or intramural hematoma. Mediastinum/Nodes: No mediastinal hematoma. No mediastinal, hilar or axillary lymphadenopathy. The visualized  thyroid and thoracic esophageal course are unremarkable. Lungs/Pleura: There is opacity in the right middle lobe, likely pulmonary contusion. No pleural effusion or pneumothorax. Musculoskeletal: There are fractures of the anterior right third through seventh ribs CT ABDOMEN PELVIS FINDINGS Hepatobiliary: No hepatic hematoma or laceration. No biliary dilatation. Normal gallbladder. Pancreas: Normal contours without ductal dilatation. No peripancreatic fluid collection. Spleen: No splenic laceration or hematoma. Adrenals/Urinary Tract: --Adrenal glands: No adrenal hemorrhage. --Right kidney/ureter: No hydronephrosis or perinephric hematoma. --Left kidney/ureter: 2 mm nonobstructive left renal calculus --Urinary bladder: Unremarkable. Stomach/Bowel: --Stomach/Duodenum: No hiatal hernia or other gastric abnormality. Normal duodenal course and caliber. --Small bowel: No dilatation or inflammation. --Colon: No focal abnormality. --Appendix: Normal. Vascular/Lymphatic: Normal course and caliber of the major abdominal vessels. No abdominal or pelvic lymphadenopathy. Reproductive: Normal prostate and seminal vesicles. Musculoskeletal. No pelvic fractures. Other: None. IMPRESSION: 1. Fractures of the anterior right third through seventh ribs with associated right middle lobe pulmonary contusion. No pneumothorax or pleural effusion. 2. Nonobstructive left renal calculus measuring 2 mm. Electronically Signed   By: Deatra Robinson M.D.   On: 12/04/2020 21:44    Review of Systems  Constitutional: Negative.   HENT: Negative.    Eyes: Negative.   Respiratory: Negative.    Cardiovascular:  Positive for chest pain.  Gastrointestinal: Negative.   Genitourinary: Negative.   Musculoskeletal: Negative.   Skin: Negative.   Neurological: Negative.   Endo/Heme/Allergies: Negative.   Psychiatric/Behavioral:  Positive for depression and substance abuse. The patient is nervous/anxious and has insomnia.    PE Blood pressure  133/89, pulse 86, temperature 98.9 F (37.2 C), temperature source Oral, resp. rate 14, SpO2 100 %. Constitutional: NAD; conversant; left upper arm ecchymosis Eyes: Moist conjunctiva; no lid lag; anicteric; PERRL Neck: Trachea midline; no thyromegaly, no cervicalgia Lungs: Normal respiratory effort; no tactile fremitus CV: RRR; no palpable thrills; no pitting edema GI: Abd nontender; no palpable hepatosplenomegaly MSK: unable  to assess gait; no clubbing/cyanosis Psychiatric: Appropriate affect; alert and oriented x3 Lymphatic: No palpable cervical or axillary lymphadenopathy   Assessment/Plan: 40 yo male who fell off ladder  L 3-7 rib fx - not requiring oxygen, multimodal pain control, respiratory care L humeral fx - sling, ortho on consult Opioid tolerance/history of substance abuse - multimodal pain control, realistic expectations  De Blanch Leonda Cristo 12/04/2020, 11:11 PM

## 2020-12-04 NOTE — Progress Notes (Signed)
Orthopedic Tech Progress Note Patient Details:  [name] Jul 03, 1980 [id number]  Ortho Devices Type of Ortho Device: Sling immobilizer Ortho Device/Splint Location: ULE Ortho Device/Splint Interventions: Ordered, Application, Adjustment   Post Interventions Patient Tolerated: Well Instructions Provided: Adjustment of device  [name] 12/04/2020, 11:07 PM

## 2020-12-05 LAB — HEPATITIS PANEL, ACUTE
HCV Ab: NONREACTIVE
Hep A IgM: NONREACTIVE
Hep B C IgM: NONREACTIVE
Hepatitis B Surface Ag: NONREACTIVE

## 2020-12-05 LAB — CREATININE, SERUM
Creatinine, Ser: 0.97 mg/dL (ref 0.61–1.24)
GFR, Estimated: >60 mL/min (ref >60)

## 2020-12-05 LAB — RESP PANEL BY RT-PCR (FLU A&B, COVID) ARPGX2
Influenza A by PCR: NEGATIVE
Influenza B by PCR: NEGATIVE
SARS Coronavirus 2 by RT PCR: NEGATIVE

## 2020-12-05 LAB — CBC
HCT: 36.7 % — ABNORMAL LOW (ref 39.0–52.0)
Hemoglobin: 11.8 g/dL — ABNORMAL LOW (ref 13.0–17.0)
MCH: 30.3 pg (ref 26.0–34.0)
MCHC: 32.2 g/dL (ref 30.0–36.0)
MCV: 94.1 fL (ref 80.0–100.0)
Platelets: 227 10*3/uL (ref 150–400)
RBC: 3.9 MIL/uL — ABNORMAL LOW (ref 4.22–5.81)
RDW: 12.9 % (ref 11.5–15.5)
WBC: 10.3 10*3/uL (ref 4.0–10.5)
nRBC: 0 % (ref 0.0–0.2)

## 2020-12-05 NOTE — ED Notes (Addendum)
PT notified IV team that he wants to leave AMA. Pt educated. MD Kinsinger notified and is okay with pt leaving stating "he is an adult" - Pt to call a ride

## 2020-12-05 NOTE — ED Notes (Signed)
Pt dressed and sling placed back on arm. This RN tied pt shoes for him and educated pt on where to wait for cab

## 2020-12-05 NOTE — ED Notes (Signed)
Pt mother called for update and asked where son was since he "discharged himself" - pt mother made aware that pt accepted risks of leaving AMA and signed AMA paperwork and that pt ambulated out of ED and that ED staff called pt a cab that he was to wait for outside in lobby

## 2020-12-05 NOTE — ED Notes (Signed)
Pt states he does not need help getting dressed - AMA papers signed - cab to be called

## 2020-12-05 NOTE — ED Notes (Addendum)
Pt mother will not come pick him up - pt yelling in room - pt stating on phone he was hungry and thirsty, pt never asked any staff for something to eat or drink - pt states he will just sit outside of ED - pt now stating he has money for a cab

## 2020-12-05 NOTE — ED Notes (Signed)
Pt is requesting to leave AMA because "he can do this at home" Pt was given phone to call his mother to come pick him up and is yelling in the room that "They are fucking stupid". MD aware of the pt wishes.

## 2020-12-07 ENCOUNTER — Ambulatory Visit (INDEPENDENT_AMBULATORY_CARE_PROVIDER_SITE_OTHER): Payer: Medicaid Other | Admitting: Orthopaedic Surgery

## 2020-12-07 ENCOUNTER — Encounter: Payer: Self-pay | Admitting: Orthopaedic Surgery

## 2020-12-07 DIAGNOSIS — S42292A Other displaced fracture of upper end of left humerus, initial encounter for closed fracture: Secondary | ICD-10-CM | POA: Diagnosis not present

## 2020-12-07 MED ORDER — OXYCODONE-ACETAMINOPHEN 5-325 MG PO TABS: 1.0000 | ORAL_TABLET | Freq: Three times a day (TID) | ORAL | 0 refills | Status: DC | PRN

## 2020-12-07 NOTE — Progress Notes (Signed)
Office Visit Note   Patient: [name]           Date of Birth: 20-Oct-1980           MRN: [id number] Visit Date: 12/07/2020              Requested by: [facility] [address] [place],  [place] [address] PCP: [facility]   Assessment & Plan: Visit Diagnoses:  1. Closed 4-part fracture of proximal humerus, left, initial encounter     Plan: Impression is displaced left proximal humerus fracture.  X-rays and CT scans were both reviewed with the patient and his mother and given the degree of displacement and angulation, this would benefit from ORIF.  I have coordinated with Dr. Steward Drone today since he was on call when [name] went through the ER and also his OR availability is likely sooner than mine.  Dr. Fredderick Severance is aware of the patient and will bring him into his office tomorrow to further discuss surgical treatment.  I gave him a prescription for Percocet for pain.  Questions encouraged and answered.  Follow-Up Instructions: No follow-ups on file.   Orders:  No orders of the defined types were placed in this encounter.  Meds ordered this encounter  Medications   oxyCODONE-acetaminophen (PERCOCET) 5-325 MG tablet    Sig: Take 1-2 tablets by mouth every 8 (eight) hours as needed for severe pain.    Dispense:  30 tablet    Refill:  0      Procedures: No procedures performed   Clinical Data: No additional findings.   Subjective: Chief Complaint  Patient presents with   Left Shoulder - Pain    [name] is a 40 year old male on disability for psychiatric conditions who is a follow-up from the ER for a displaced left proximal humerus fracture.  He is right-hand dominant.  He fell about 6 feet off a ladder while cutting bamboo.  He also sustained rib fractures that did not require admission to the hospital.  His mother is with him today.  He is wearing a shoulder sling with immobilizer strap.  Describes swelling bruising and severe pain  with any movement of the arm.   Review of Systems  Constitutional: Negative.   All other systems reviewed and are negative.   Objective: Vital Signs: There were no vitals taken for this visit.  Physical Exam Vitals and nursing note reviewed.  Constitutional:      Appearance: He is well-developed.  Pulmonary:     Effort: Pulmonary effort is normal.  Abdominal:     Palpations: Abdomen is soft.  Skin:    General: Skin is warm.  Neurological:     Mental Status: He is alert and oriented to person, place, and time.  Psychiatric:        Behavior: Behavior normal.        Thought Content: Thought content normal.        Judgment: Judgment normal.    Ortho Exam  Left shoulder girdle shows significant swelling of the arm and bruising.  Skin is intact.  He has psoriasis at baseline.  No neurovascular compromise.  Strong radial pulse.  Axillary nerve intact.  Range of motion of the shoulder not tested secondary to pain and guarding.  Specialty Comments:  No specialty comments available.  Imaging: No results found.   PMFS History: Patient Active Problem List   Diagnosis Date Noted   Closed 4-part fracture of proximal humerus, left,  initial encounter 12/07/2020   Rib fractures 12/04/2020   Cellulitis of multiple sites of upper arm 10/2018   Opioid abuse with opioid-induced mood disorder (HCC) 10/06/2017   Depression    PTSD (post-traumatic stress disorder)    Tachycardia    Acute blood loss anemia    Weakness 09/12/2015   Respiratory failure, acute (HCC)    Respiratory failure (HCC)    Atelectasis    Altered mental status 09/03/2015   Opioid overdose (HCC) 08/29/2015   Leukocytosis 08/29/2015   Anemia 08/29/2015   Polysubstance abuse (HCC) 08/29/2015   Tobacco abuse 08/29/2015   Past Medical History:  Diagnosis Date   Anxiety    Benzodiazepine abuse (HCC)    Chronic tachycardia    Per Dr. Doreene Eland (psych)   Depression    Drug use    History of IBS     Polysubstance abuse (HCC)     History reviewed. No pertinent family history.  History reviewed. No pertinent surgical history. Social History   Occupational History   Occupation: disabled  Tobacco Use   Smoking status: Every Day    Packs/day: 0.50    Types: Cigarettes   Smokeless tobacco: Never  Vaping Use   Vaping Use: Never used  Substance and Sexual Activity   Alcohol use: No   Drug use: Yes    Types: IV, Cocaine, Marijuana, Amphetamines, Hydrocodone, Oxycodone    Comment: [id number]   Sexual activity: Yes    Birth control/protection: Condom

## 2020-12-08 ENCOUNTER — Ambulatory Visit (HOSPITAL_BASED_OUTPATIENT_CLINIC_OR_DEPARTMENT_OTHER): Payer: Self-pay | Admitting: Orthopaedic Surgery

## 2020-12-08 ENCOUNTER — Other Ambulatory Visit (HOSPITAL_BASED_OUTPATIENT_CLINIC_OR_DEPARTMENT_OTHER): Payer: Self-pay

## 2020-12-08 ENCOUNTER — Ambulatory Visit (INDEPENDENT_AMBULATORY_CARE_PROVIDER_SITE_OTHER): Payer: Medicaid Other | Admitting: Orthopaedic Surgery

## 2020-12-08 ENCOUNTER — Other Ambulatory Visit: Payer: Self-pay

## 2020-12-08 ENCOUNTER — Telehealth: Payer: Self-pay | Admitting: Orthopaedic Surgery

## 2020-12-08 ENCOUNTER — Ambulatory Visit (HOSPITAL_BASED_OUTPATIENT_CLINIC_OR_DEPARTMENT_OTHER): Payer: Medicaid Other | Admitting: Orthopaedic Surgery

## 2020-12-08 VITALS — Ht 70.0 in | Wt 179.0 lb

## 2020-12-08 DIAGNOSIS — S42232A 3-part fracture of surgical neck of left humerus, initial encounter for closed fracture: Secondary | ICD-10-CM | POA: Diagnosis not present

## 2020-12-08 MED ORDER — ACETAMINOPHEN 500 MG PO TABS
500.0000 mg | ORAL_TABLET | Freq: Three times a day (TID) | ORAL | 0 refills | Status: AC
  Filled 2020-12-08: qty 30, 10d supply, fill #0

## 2020-12-08 MED ORDER — ASPIRIN EC 325 MG PO TBEC
325.0000 mg | DELAYED_RELEASE_TABLET | Freq: Every day | ORAL | 0 refills | Status: DC
  Filled 2020-12-08: qty 30, 30d supply, fill #0

## 2020-12-08 MED ORDER — OXYCODONE HCL 5 MG PO TABS
15.0000 mg | ORAL_TABLET | ORAL | 0 refills | Status: DC | PRN
  Filled 2020-12-08: qty 90, 5d supply, fill #0
  Filled 2020-12-08: qty 30, 5d supply, fill #0

## 2020-12-08 MED ORDER — NALOXONE HCL 4 MG/0.1ML NA LIQD
NASAL | 0 refills | Status: AC
  Filled 2020-12-08: qty 2, 2d supply, fill #0

## 2020-12-08 MED ORDER — NAPROXEN SODIUM 220 MG PO TABS
220.0000 mg | ORAL_TABLET | Freq: Two times a day (BID) | ORAL | 3 refills | Status: AC | PRN
  Filled 2020-12-08: qty 30, 15d supply, fill #0

## 2020-12-08 NOTE — H&P (View-Only) (Signed)
Chief Complaint: Left shoulder and proximal humerus fracture     History of Present Illness:    [name] is a 40 y.o. male right-hand-dominant male with a left proximal humerus fracture after a fall 6 feet from a ladder.  He was attempting to remove bamboo from his home.  Of note he does have a history of opioid dependence with Suboxone usage.  He has had quite significant pain as result of this in the left shoulder.  He initially presented to the emergency room where initial work-up was obtained and he subsequently left as there was somewhat of a delay and also does struggle with agoraphobia and anxiety which made this very difficult for him.  He smokes approximately 5 cigarettes a day.  He is not currently working and is disabled.  He does have a Suboxone clinic which she receives this medication for him.    Surgical History:   None  PMH/PSH/Family History/Social History/Meds/Allergies:    Past Medical History:  Diagnosis Date   Anxiety    Benzodiazepine abuse (HCC)    Chronic tachycardia    Per Dr. Doreene Eland (psych)   Depression    Drug use    History of IBS    Polysubstance abuse (HCC)    No past surgical history on file. Social History   Socioeconomic History   Marital status: Significant Other    Spouse name: Not on file   Number of children: Not on file   Years of education: Not on file   Highest education level: Not on file  Occupational History   Occupation: disabled  Tobacco Use   Smoking status: Every Day    Packs/day: 0.50    Types: Cigarettes   Smokeless tobacco: Never  Vaping Use   Vaping Use: Never used  Substance and Sexual Activity   Alcohol use: No   Drug use: Yes    Types: IV, Cocaine, Marijuana, Amphetamines, Hydrocodone, Oxycodone    Comment: [id number]   Sexual activity: Yes    Birth control/protection: Condom  Other Topics Concern   Not on file  Social History Narrative   n/a   Social Determinants  of Health   Financial Resource Strain: Not on file  Food Insecurity: Not on file  Transportation Needs: Not on file  Physical Activity: Not on file  Stress: Not on file  Social Connections: Not on file   No family history on file. Allergies  Allergen Reactions   Compazine [Prochlorperazine Edisylate] Other (See Comments)    Blood pressure drops   Haldol [Haloperidol]     Restless legs   Phenergan [Promethazine Hcl] Other (See Comments)    Blood pressure drops    Reglan [Metoclopramide]     Restless legs   Tramadol Other (See Comments)    Blood pressure drops    Current Outpatient Medications  Medication Sig Dispense Refill   acetaminophen (TYLENOL) 500 MG tablet Take 1 tablet (500 mg total) by mouth every 8 (eight) hours for 10 days. 30 tablet 0   aspirin EC 325 MG tablet Take 1 tablet (325 mg total) by mouth daily. 30 tablet 0   naproxen sodium (ALEVE) 220 MG tablet Take 1 tablet (220 mg total) by mouth 2 (two) times daily as needed. 30 tablet 3   oxyCODONE (OXY IR/ROXICODONE) 5 MG immediate release  tablet Take 3 tablets (15 mg total) by mouth every 4 (four) hours as needed. 90 tablet 0   albuterol (VENTOLIN HFA) 108 (90 Base) MCG/ACT inhaler Inhale 1-2 puffs into the lungs every 6 (six) hours as needed for wheezing or shortness of breath.     buprenorphine-naloxone (SUBOXONE) 8-2 mg SUBL SL tablet Place 1-2 tablets under the tongue See admin instructions. 2 films in the morning 1 film in the evening     Fluvoxamine Maleate 100 MG CP24 Take 200 mg by mouth daily. (Patient not taking: No sig reported)     gabapentin (NEURONTIN) 600 MG tablet Take 600 mg by mouth 3 (three) times daily.     ibuprofen (ADVIL) 200 MG tablet Take 800 mg by mouth every 6 (six) hours as needed for headache or moderate pain.     LORazepam (ATIVAN) 1 MG tablet Take 1 mg by mouth 4 (four) times daily.     naproxen (NAPROSYN) 500 MG tablet Take 500 mg by mouth 2 (two) times daily as needed for moderate pain.      ondansetron (ZOFRAN ODT) 4 MG disintegrating tablet Take 1 tablet (4 mg total) by mouth every 8 (eight) hours as needed for nausea or vomiting. 10 tablet 0   oxyCODONE-acetaminophen (PERCOCET) 5-325 MG tablet Take 1-2 tablets by mouth every 8 (eight) hours as needed for severe pain. 30 tablet 0   polyethylene glycol powder (GLYCOLAX/MIRALAX) 17 GM/SCOOP powder Take 17 g by mouth at bedtime.     temazepam (RESTORIL) 15 MG capsule Take 15 mg by mouth at bedtime.     triamcinolone (KENALOG) 0.025 % cream Apply 1 application topically 2 (two) times daily as needed (for rash).     No current facility-administered medications for this visit.   No results found.  Review of Systems:   A ROS was performed including pertinent positives and negatives as documented in the HPI.  Physical Exam :   Constitutional: NAD and appears stated age Neurological: Alert and oriented Psych: Appropriate affect and cooperative Height 5\' 10"  (1.778 m), weight 179 lb (81.2 kg).   Comprehensive Musculoskeletal Exam:    Significant bruising and swelling about the left shoulder.  He has a strong 2+ radial pulse which is equal to the contralateral side.  He is able to weakly extend the thumb and extensors of the wrist although he endorses numbness in all sensation and distributions of the left hand.  He has decreased sensation in the axillary distribution over the deltoid.  He can weakly fire his biceps and triceps.  He is currently in a sling.  Imaging:   Xray (3 views left shoulder): Completely displaced humeral neck fracture with extension into the greater tuberosity  CT left shoulder Displaced humeral neck fracture with extension to the greater tuberosity.  I personally reviewed and interpreted the radiographs.   Assessment:   40 year old male with left humeral neck fracture after fall 6 feet from a ladder  At this time, given his displacement profile I do believe that surgery for open reduction internal  fixation would be necessary to optimize his function long-term.  He described that without surgery it is highly likely that he would lack significant flexion or elevation of the left arm.  We did discuss his opioid dependence at length.  We discussed that ultimately his pain requirements will be significantly higher postop and that his duration of pain will be higher postop.  He understands that I will work with him closely to treat his pain  acutely and would like to obtain a chronic pain management consult to assist with this pain control as this is quite a nuanced treatment.  I described the risks of the surgery.  Specifically the proximity to the vasculature of the shoulder leads to the risk of these types of nerve or blood vessel issues.  He understands this.  He is still wishing to undergo the surgery given the results if he were to pursue nonoperative management.  I discussed the role of a long-term Exparel block for the shoulder in order to best control his pain.  He would like to do surgery as an outpatient given his history of anxiety and agoraphobia he would not like to be admitted to the hospital.  I believe this is reasonable given the Exparel block.  I will plan to make a referral for assisting me with management of his chronic opioid dependence.  Plan :    -Plan for open reduction internal fixation of the left humerus   After a lengthy discussion of treatment options, including risks, benefits, alternatives, complications of surgical and nonsurgical conservative options, the patient elected surgical repair.   The patient  is aware of the material risks  and complications including, but not limited to injury to adjacent structures, neurovascular injury, infection, numbness, bleeding, implant failure, thermal burns, stiffness, persistent pain, failure to heal, disease transmission from allograft, need for further surgery, dislocation, anesthetic risks, blood clots, risks of death,and others. The  probabilities of surgical success and failure discussed with patient given their particular co-morbidities.The time and nature of expected rehabilitation and recovery was discussed.The patient's questions were all answered preoperatively.  No barriers to understanding were noted. I explained the natural history of the disease process and Rx rationale.  I explained to the patient what I considered to be reasonable expectations given their personal situation.  The final treatment plan was arrived at through a shared patient decision making process model.   I personally saw and evaluated the patient, and participated in the management and treatment plan.  [name], MD Attending Physician, Orthopedic Surgery  This document was dictated using Dragon voice recognition software. A reasonable attempt at proof reading has been made to minimize errors.

## 2020-12-08 NOTE — Progress Notes (Signed)
                               Chief Complaint: Left shoulder and proximal humerus fracture     History of Present Illness:    [name] is a 40 y.o. male right-hand-dominant male with a left proximal humerus fracture after a fall 6 feet from a ladder.  He was attempting to remove bamboo from his home.  Of note he does have a history of opioid dependence with Suboxone usage.  He has had quite significant pain as result of this in the left shoulder.  He initially presented to the emergency room where initial work-up was obtained and he subsequently left as there was somewhat of a delay and also does struggle with agoraphobia and anxiety which made this very difficult for him.  He smokes approximately 5 cigarettes a day.  He is not currently working and is disabled.  He does have a Suboxone clinic which she receives this medication for him.    Surgical History:   None  PMH/PSH/Family History/Social History/Meds/Allergies:    Past Medical History:  Diagnosis Date   Anxiety    Benzodiazepine abuse (HCC)    Chronic tachycardia    Per Dr. Hendrix (psych)   Depression    Drug use    History of IBS    Polysubstance abuse (HCC)    No past surgical history on file. Social History   Socioeconomic History   Marital status: Significant Other    Spouse name: Not on file   Number of children: Not on file   Years of education: Not on file   Highest education level: Not on file  Occupational History   Occupation: disabled  Tobacco Use   Smoking status: Every Day    Packs/day: 0.50    Types: Cigarettes   Smokeless tobacco: Never  Vaping Use   Vaping Use: Never used  Substance and Sexual Activity   Alcohol use: No   Drug use: Yes    Types: IV, Cocaine, Marijuana, Amphetamines, Hydrocodone, Oxycodone    Comment: [id number]   Sexual activity: Yes    Birth control/protection: Condom  Other Topics Concern   Not on file  Social History Narrative   n/a   Social Determinants  of Health   Financial Resource Strain: Not on file  Food Insecurity: Not on file  Transportation Needs: Not on file  Physical Activity: Not on file  Stress: Not on file  Social Connections: Not on file   No family history on file. Allergies  Allergen Reactions   Compazine [Prochlorperazine Edisylate] Other (See Comments)    Blood pressure drops   Haldol [Haloperidol]     Restless legs   Phenergan [Promethazine Hcl] Other (See Comments)    Blood pressure drops    Reglan [Metoclopramide]     Restless legs   Tramadol Other (See Comments)    Blood pressure drops    Current Outpatient Medications  Medication Sig Dispense Refill   acetaminophen (TYLENOL) 500 MG tablet Take 1 tablet (500 mg total) by mouth every 8 (eight) hours for 10 days. 30 tablet 0   aspirin EC 325 MG tablet Take 1 tablet (325 mg total) by mouth daily. 30 tablet 0   naproxen sodium (ALEVE) 220 MG tablet Take 1 tablet (220 mg total) by mouth 2 (two) times daily as needed. 30 tablet 3   oxyCODONE (OXY IR/ROXICODONE) 5 MG immediate release   tablet Take 3 tablets (15 mg total) by mouth every 4 (four) hours as needed. 90 tablet 0   albuterol (VENTOLIN HFA) 108 (90 Base) MCG/ACT inhaler Inhale 1-2 puffs into the lungs every 6 (six) hours as needed for wheezing or shortness of breath.     buprenorphine-naloxone (SUBOXONE) 8-2 mg SUBL SL tablet Place 1-2 tablets under the tongue See admin instructions. 2 films in the morning 1 film in the evening     Fluvoxamine Maleate 100 MG CP24 Take 200 mg by mouth daily. (Patient not taking: No sig reported)     gabapentin (NEURONTIN) 600 MG tablet Take 600 mg by mouth 3 (three) times daily.     ibuprofen (ADVIL) 200 MG tablet Take 800 mg by mouth every 6 (six) hours as needed for headache or moderate pain.     LORazepam (ATIVAN) 1 MG tablet Take 1 mg by mouth 4 (four) times daily.     naproxen (NAPROSYN) 500 MG tablet Take 500 mg by mouth 2 (two) times daily as needed for moderate pain.      ondansetron (ZOFRAN ODT) 4 MG disintegrating tablet Take 1 tablet (4 mg total) by mouth every 8 (eight) hours as needed for nausea or vomiting. 10 tablet 0   oxyCODONE-acetaminophen (PERCOCET) 5-325 MG tablet Take 1-2 tablets by mouth every 8 (eight) hours as needed for severe pain. 30 tablet 0   polyethylene glycol powder (GLYCOLAX/MIRALAX) 17 GM/SCOOP powder Take 17 g by mouth at bedtime.     temazepam (RESTORIL) 15 MG capsule Take 15 mg by mouth at bedtime.     triamcinolone (KENALOG) 0.025 % cream Apply 1 application topically 2 (two) times daily as needed (for rash).     No current facility-administered medications for this visit.   No results found.  Review of Systems:   A ROS was performed including pertinent positives and negatives as documented in the HPI.  Physical Exam :   Constitutional: NAD and appears stated age Neurological: Alert and oriented Psych: Appropriate affect and cooperative Height 5' 10" (1.778 m), weight 179 lb (81.2 kg).   Comprehensive Musculoskeletal Exam:    Significant bruising and swelling about the left shoulder.  He has a strong 2+ radial pulse which is equal to the contralateral side.  He is able to weakly extend the thumb and extensors of the wrist although he endorses numbness in all sensation and distributions of the left hand.  He has decreased sensation in the axillary distribution over the deltoid.  He can weakly fire his biceps and triceps.  He is currently in a sling.  Imaging:   Xray (3 views left shoulder): Completely displaced humeral neck fracture with extension into the greater tuberosity  CT left shoulder Displaced humeral neck fracture with extension to the greater tuberosity.  I personally reviewed and interpreted the radiographs.   Assessment:   40-year-old male with left humeral neck fracture after fall 6 feet from a ladder  At this time, given his displacement profile I do believe that surgery for open reduction internal  fixation would be necessary to optimize his function long-term.  He described that without surgery it is highly likely that he would lack significant flexion or elevation of the left arm.  We did discuss his opioid dependence at length.  We discussed that ultimately his pain requirements will be significantly higher postop and that his duration of pain will be higher postop.  He understands that I will work with him closely to treat his pain   acutely and would like to obtain a chronic pain management consult to assist with this pain control as this is quite a nuanced treatment.  I described the risks of the surgery.  Specifically the proximity to the vasculature of the shoulder leads to the risk of these types of nerve or blood vessel issues.  He understands this.  He is still wishing to undergo the surgery given the results if he were to pursue nonoperative management.  I discussed the role of a long-term Exparel block for the shoulder in order to best control his pain.  He would like to do surgery as an outpatient given his history of anxiety and agoraphobia he would not like to be admitted to the hospital.  I believe this is reasonable given the Exparel block.  I will plan to make a referral for assisting me with management of his chronic opioid dependence.  Plan :    -Plan for open reduction internal fixation of the left humerus   After a lengthy discussion of treatment options, including risks, benefits, alternatives, complications of surgical and nonsurgical conservative options, the patient elected surgical repair.   The patient  is aware of the material risks  and complications including, but not limited to injury to adjacent structures, neurovascular injury, infection, numbness, bleeding, implant failure, thermal burns, stiffness, persistent pain, failure to heal, disease transmission from allograft, need for further surgery, dislocation, anesthetic risks, blood clots, risks of death,and others. The  probabilities of surgical success and failure discussed with patient given their particular co-morbidities.The time and nature of expected rehabilitation and recovery was discussed.The patient's questions were all answered preoperatively.  No barriers to understanding were noted. I explained the natural history of the disease process and Rx rationale.  I explained to the patient what I considered to be reasonable expectations given their personal situation.  The final treatment plan was arrived at through a shared patient decision making process model.   I personally saw and evaluated the patient, and participated in the management and treatment plan.  [name], MD Attending Physician, Orthopedic Surgery  This document was dictated using Dragon voice recognition software. A reasonable attempt at proof reading has been made to minimize errors.

## 2020-12-08 NOTE — Telephone Encounter (Signed)
Pt called requesting a stronger pain medication. Pt states he has a high tolerance for pain meds and his medication needs to be stronger. Please call pt about this matter at [phone].

## 2020-12-09 ENCOUNTER — Encounter (HOSPITAL_COMMUNITY): Payer: Self-pay | Admitting: Orthopaedic Surgery

## 2020-12-09 ENCOUNTER — Telehealth: Payer: Self-pay

## 2020-12-09 ENCOUNTER — Other Ambulatory Visit: Payer: Self-pay

## 2020-12-09 NOTE — Telephone Encounter (Signed)
DrMarland Kitchen Steward Drone has taken over his care and will be operating on him next week.  Can we defer to him?

## 2020-12-09 NOTE — Telephone Encounter (Signed)
Patients mother Rosey Bath called stating that [facility] does not do pain management.  Would like to know if she was given the right number or if there is another place for patient to go.  Cb# [phone].  Please advise.  Thank you.

## 2020-12-09 NOTE — Progress Notes (Signed)
Spoke with pt for pre-op call. Pt states he has chronic tachycardia due very high anxiety. Pt has an extensive hx of substance abuse, mood disorder, clinical depression, ADHD, severe anxiety. Pt is on Suboxone and his last dose was Wednesday, 12/07/20 at 3 AM. He is now taking Oxycodone IR. Pt was put on Aspirin 325 mg and was told to take it prior to surgery.

## 2020-12-12 ENCOUNTER — Encounter (HOSPITAL_COMMUNITY): Payer: Self-pay | Admitting: Orthopaedic Surgery

## 2020-12-12 ENCOUNTER — Encounter (HOSPITAL_COMMUNITY)
Admission: RE | Disposition: A | Discharge status: Home / Self Care | Payer: Self-pay | Source: Ambulatory Visit | Attending: Orthopaedic Surgery

## 2020-12-12 ENCOUNTER — Ambulatory Visit (HOSPITAL_COMMUNITY): Payer: Medicaid Other | Admitting: Anesthesiology

## 2020-12-12 ENCOUNTER — Ambulatory Visit (HOSPITAL_COMMUNITY): Payer: Medicaid Other

## 2020-12-12 ENCOUNTER — Other Ambulatory Visit (HOSPITAL_BASED_OUTPATIENT_CLINIC_OR_DEPARTMENT_OTHER): Payer: Self-pay | Admitting: Orthopaedic Surgery

## 2020-12-12 ENCOUNTER — Other Ambulatory Visit: Payer: Self-pay

## 2020-12-12 ENCOUNTER — Ambulatory Visit (HOSPITAL_COMMUNITY)
Admission: RE | Admit: 2020-12-12 | Discharge: 2020-12-12 | Disposition: A | Discharge status: Home / Self Care | Payer: Medicaid Other | Source: Ambulatory Visit | Attending: Orthopaedic Surgery | Admitting: Orthopaedic Surgery

## 2020-12-12 DIAGNOSIS — Z79899 Other long term (current) drug therapy: Secondary | ICD-10-CM | POA: Diagnosis not present

## 2020-12-12 DIAGNOSIS — Z7982 Long term (current) use of aspirin: Secondary | ICD-10-CM | POA: Insufficient documentation

## 2020-12-12 DIAGNOSIS — S42292A Other displaced fracture of upper end of left humerus, initial encounter for closed fracture: Secondary | ICD-10-CM

## 2020-12-12 DIAGNOSIS — Z885 Allergy status to narcotic agent status: Secondary | ICD-10-CM | POA: Insufficient documentation

## 2020-12-12 DIAGNOSIS — W11XXXA Fall on and from ladder, initial encounter: Secondary | ICD-10-CM | POA: Insufficient documentation

## 2020-12-12 DIAGNOSIS — S42232A 3-part fracture of surgical neck of left humerus, initial encounter for closed fracture: Secondary | ICD-10-CM

## 2020-12-12 DIAGNOSIS — F419 Anxiety disorder, unspecified: Secondary | ICD-10-CM | POA: Diagnosis not present

## 2020-12-12 DIAGNOSIS — Z888 Allergy status to other drugs, medicaments and biological substances status: Secondary | ICD-10-CM | POA: Diagnosis not present

## 2020-12-12 DIAGNOSIS — F1120 Opioid dependence, uncomplicated: Secondary | ICD-10-CM | POA: Insufficient documentation

## 2020-12-12 DIAGNOSIS — F17210 Nicotine dependence, cigarettes, uncomplicated: Secondary | ICD-10-CM | POA: Diagnosis not present

## 2020-12-12 DIAGNOSIS — F4000 Agoraphobia, unspecified: Secondary | ICD-10-CM | POA: Insufficient documentation

## 2020-12-12 DIAGNOSIS — S42252A Displaced fracture of greater tuberosity of left humerus, initial encounter for closed fracture: Secondary | ICD-10-CM | POA: Insufficient documentation

## 2020-12-12 DIAGNOSIS — Z419 Encounter for procedure for purposes other than remedying health state, unspecified: Secondary | ICD-10-CM

## 2020-12-12 HISTORY — DX: Post-traumatic stress disorder, unspecified: F43.10

## 2020-12-12 HISTORY — DX: Headache, unspecified: R51.9

## 2020-12-12 HISTORY — DX: Other complications of anesthesia, initial encounter: T88.59XA

## 2020-12-12 HISTORY — DX: Anemia, unspecified: D64.9

## 2020-12-12 HISTORY — DX: Pneumonia, unspecified organism: J18.9

## 2020-12-12 HISTORY — DX: Gastro-esophageal reflux disease without esophagitis: K21.9

## 2020-12-12 HISTORY — DX: Attention-deficit hyperactivity disorder, unspecified type: F90.9

## 2020-12-12 HISTORY — PX: ORIF HUMERUS FRACTURE: SHX2126

## 2020-12-12 HISTORY — DX: Unspecified mood (affective) disorder: F39

## 2020-12-12 SURGERY — OPEN REDUCTION INTERNAL FIXATION (ORIF) PROXIMAL HUMERUS FRACTURE
Anesthesia: Regional | Site: Shoulder | Laterality: Left

## 2020-12-12 MED ORDER — ROCURONIUM BROMIDE 10 MG/ML (PF) SYRINGE
PREFILLED_SYRINGE | INTRAVENOUS | Status: AC
  Filled 2020-12-12: qty 10

## 2020-12-12 MED ORDER — CHLORHEXIDINE GLUCONATE 0.12 % MT SOLN
15.0000 mL | Freq: Once | OROMUCOSAL | Status: AC
  Administered 2020-12-12: 15 mL via OROMUCOSAL
  Filled 2020-12-12: qty 15

## 2020-12-12 MED ORDER — OXYCODONE HCL 15 MG PO TABS: 15.0000 mg | ORAL_TABLET | ORAL | 0 refills | Status: DC | PRN

## 2020-12-12 MED ORDER — SUCCINYLCHOLINE CHLORIDE 200 MG/10ML IV SOSY
PREFILLED_SYRINGE | INTRAVENOUS | Status: AC
  Filled 2020-12-12: qty 10

## 2020-12-12 MED ORDER — DIPHENHYDRAMINE HCL 50 MG/ML IJ SOLN
INTRAMUSCULAR | Status: DC | PRN
  Administered 2020-12-12: 25 mg via INTRAVENOUS

## 2020-12-12 MED ORDER — ONDANSETRON HCL 4 MG/2ML IJ SOLN
INTRAMUSCULAR | Status: DC | PRN
  Administered 2020-12-12: 4 mg via INTRAVENOUS

## 2020-12-12 MED ORDER — ONDANSETRON HCL 4 MG/2ML IJ SOLN
INTRAMUSCULAR | Status: AC
  Filled 2020-12-12: qty 8

## 2020-12-12 MED ORDER — VANCOMYCIN HCL 1000 MG IV SOLR
INTRAVENOUS | Status: AC
  Filled 2020-12-12: qty 20

## 2020-12-12 MED ORDER — MIDAZOLAM HCL 2 MG/2ML IJ SOLN
INTRAMUSCULAR | Status: AC
  Administered 2020-12-12: 2 mg via INTRAVENOUS
  Filled 2020-12-12: qty 2

## 2020-12-12 MED ORDER — ROCURONIUM BROMIDE 10 MG/ML (PF) SYRINGE
PREFILLED_SYRINGE | INTRAVENOUS | Status: DC | PRN
  Administered 2020-12-12: 10 mg via INTRAVENOUS
  Administered 2020-12-12: 60 mg via INTRAVENOUS

## 2020-12-12 MED ORDER — LACTATED RINGERS IV SOLN: INTRAVENOUS | Status: DC | PRN

## 2020-12-12 MED ORDER — DEXAMETHASONE SODIUM PHOSPHATE 10 MG/ML IJ SOLN
INTRAMUSCULAR | Status: DC | PRN
  Administered 2020-12-12: 10 mg via INTRAVENOUS

## 2020-12-12 MED ORDER — VANCOMYCIN HCL 1000 MG IV SOLR
INTRAVENOUS | Status: DC | PRN
  Administered 2020-12-12: 1000 mg via TOPICAL

## 2020-12-12 MED ORDER — FENTANYL CITRATE (PF) 250 MCG/5ML IJ SOLN
INTRAMUSCULAR | Status: DC | PRN
  Administered 2020-12-12: 50 ug via INTRAVENOUS

## 2020-12-12 MED ORDER — LACTATED RINGERS IV SOLN: INTRAVENOUS | Status: DC

## 2020-12-12 MED ORDER — MIDAZOLAM HCL 2 MG/2ML IJ SOLN: 2.0000 mg | Freq: Once | INTRAMUSCULAR | Status: AC

## 2020-12-12 MED ORDER — BUPIVACAINE HCL (PF) 0.5 % IJ SOLN
INTRAMUSCULAR | Status: DC | PRN
  Administered 2020-12-12: 18 mL

## 2020-12-12 MED ORDER — BUPIVACAINE LIPOSOME 1.3 % IJ SUSP
INTRAMUSCULAR | Status: DC | PRN
  Administered 2020-12-12: 10 mL via PERINEURAL

## 2020-12-12 MED ORDER — EPHEDRINE SULFATE-NACL 50-0.9 MG/10ML-% IV SOSY
PREFILLED_SYRINGE | INTRAVENOUS | Status: DC | PRN
  Administered 2020-12-12 (×4): 5 mg via INTRAVENOUS

## 2020-12-12 MED ORDER — SODIUM CHLORIDE 0.9 % IR SOLN
Status: DC | PRN
  Administered 2020-12-12: 1000 mL

## 2020-12-12 MED ORDER — ORAL CARE MOUTH RINSE: 15.0000 mL | Freq: Once | OROMUCOSAL | Status: AC

## 2020-12-12 MED ORDER — MIDAZOLAM HCL 5 MG/5ML IJ SOLN
INTRAMUSCULAR | Status: DC | PRN
  Administered 2020-12-12: 2 mg via INTRAVENOUS

## 2020-12-12 MED ORDER — FENTANYL CITRATE (PF) 100 MCG/2ML IJ SOLN
INTRAMUSCULAR | Status: AC
  Administered 2020-12-12: 100 ug via INTRAVENOUS
  Filled 2020-12-12: qty 2

## 2020-12-12 MED ORDER — ACETAMINOPHEN 500 MG PO TABS
1000.0000 mg | ORAL_TABLET | Freq: Once | ORAL | Status: AC
  Administered 2020-12-12: 1000 mg via ORAL
  Filled 2020-12-12: qty 2

## 2020-12-12 MED ORDER — PHENYLEPHRINE 40 MCG/ML (10ML) SYRINGE FOR IV PUSH (FOR BLOOD PRESSURE SUPPORT)
PREFILLED_SYRINGE | INTRAVENOUS | Status: DC | PRN
  Administered 2020-12-12: 80 ug via INTRAVENOUS

## 2020-12-12 MED ORDER — GLYCOPYRROLATE 0.2 MG/ML IJ SOLN
INTRAMUSCULAR | Status: DC | PRN
  Administered 2020-12-12: .2 mg via INTRAVENOUS

## 2020-12-12 MED ORDER — CEFAZOLIN SODIUM-DEXTROSE 2-4 GM/100ML-% IV SOLN
2.0000 g | INTRAVENOUS | Status: AC
  Administered 2020-12-12: 2 g via INTRAVENOUS
  Filled 2020-12-12: qty 100

## 2020-12-12 MED ORDER — FENTANYL CITRATE (PF) 100 MCG/2ML IJ SOLN: 100.0000 ug | Freq: Once | INTRAMUSCULAR | Status: AC

## 2020-12-12 MED ORDER — BUPIVACAINE HCL (PF) 0.25 % IJ SOLN
INTRAMUSCULAR | Status: AC
  Filled 2020-12-12: qty 30

## 2020-12-12 MED ORDER — LIDOCAINE 2% (20 MG/ML) 5 ML SYRINGE
INTRAMUSCULAR | Status: AC
  Filled 2020-12-12: qty 5

## 2020-12-12 MED ORDER — SUGAMMADEX SODIUM 200 MG/2ML IV SOLN
INTRAVENOUS | Status: DC | PRN
  Administered 2020-12-12: 200 mg via INTRAVENOUS

## 2020-12-12 MED ORDER — TRANEXAMIC ACID-NACL 1000-0.7 MG/100ML-% IV SOLN
1000.0000 mg | INTRAVENOUS | Status: AC
Start: 2020-12-12 — End: 2020-12-12
  Administered 2020-12-12: 1000 mg via INTRAVENOUS
  Filled 2020-12-12: qty 100

## 2020-12-12 MED ORDER — DEXMEDETOMIDINE (PRECEDEX) IN NS 20 MCG/5ML (4 MCG/ML) IV SYRINGE
PREFILLED_SYRINGE | INTRAVENOUS | Status: DC | PRN
  Administered 2020-12-12: 12 ug via INTRAVENOUS

## 2020-12-12 MED ORDER — PHENYLEPHRINE HCL-NACL 20-0.9 MG/250ML-% IV SOLN
INTRAVENOUS | Status: DC | PRN
Start: 2020-12-12 — End: 2020-12-12
  Administered 2020-12-12: 25 ug/min via INTRAVENOUS

## 2020-12-12 MED ORDER — GABAPENTIN 300 MG PO CAPS
300.0000 mg | ORAL_CAPSULE | Freq: Once | ORAL | Status: AC
  Administered 2020-12-12: 300 mg via ORAL
  Filled 2020-12-12: qty 1

## 2020-12-12 MED ORDER — PROPOFOL 10 MG/ML IV BOLUS
INTRAVENOUS | Status: DC | PRN
  Administered 2020-12-12: 200 mg via INTRAVENOUS

## 2020-12-12 MED ORDER — OXYCODONE HCL ER 10 MG PO T12A: 10.0000 mg | EXTENDED_RELEASE_TABLET | Freq: Two times a day (BID) | ORAL | 0 refills | Status: DC

## 2020-12-12 MED ORDER — 0.9 % SODIUM CHLORIDE (POUR BTL) OPTIME
TOPICAL | Status: DC | PRN
  Administered 2020-12-12: 1000 mL

## 2020-12-12 SURGICAL SUPPLY — 78 items
ADH SKN CLS APL DERMABOND .7 (GAUZE/BANDAGES/DRESSINGS) ×1
ALCOHOL 70% 16 OZ (MISCELLANEOUS) ×2 IMPLANT
APL PRP STRL LF DISP 70% ISPRP (MISCELLANEOUS) ×2
APL SKNCLS STERI-STRIP NONHPOA (GAUZE/BANDAGES/DRESSINGS) ×1
BAG COUNTER SPONGE SURGICOUNT (BAG) ×2 IMPLANT
BAG SPNG CNTER NS LX DISP (BAG) ×1
BENZOIN TINCTURE PRP APPL 2/3 (GAUZE/BANDAGES/DRESSINGS) ×2 IMPLANT
BIT DRILL CALIBRATED 2.7 (BIT) ×1 IMPLANT
BNDG COHESIVE 4X5 TAN STRL (GAUZE/BANDAGES/DRESSINGS) ×2 IMPLANT
BNDG ELASTIC 4X5.8 VLCR STR LF (GAUZE/BANDAGES/DRESSINGS) IMPLANT
BNDG ELASTIC 6X5.8 VLCR STR LF (GAUZE/BANDAGES/DRESSINGS) IMPLANT
CHLORAPREP W/TINT 26 (MISCELLANEOUS) ×4 IMPLANT
COVER SURGICAL LIGHT HANDLE (MISCELLANEOUS) ×2 IMPLANT
DERMABOND ADVANCED (GAUZE/BANDAGES/DRESSINGS) ×1
DERMABOND ADVANCED .7 DNX12 (GAUZE/BANDAGES/DRESSINGS) IMPLANT
DRAIN PENROSE 1/2X12 LTX STRL (WOUND CARE) IMPLANT
DRAPE C-ARM 42X72 X-RAY (DRAPES) IMPLANT
DRAPE IMP U-DRAPE 54X76 (DRAPES) ×2 IMPLANT
DRAPE U-SHAPE 47X51 STRL (DRAPES) ×4 IMPLANT
DRSG AQUACEL AG ADV 3.5X10 (GAUZE/BANDAGES/DRESSINGS) ×1 IMPLANT
DRSG PAD ABDOMINAL 8X10 ST (GAUZE/BANDAGES/DRESSINGS) IMPLANT
ELECT REM PT RETURN 9FT ADLT (ELECTROSURGICAL) ×2
ELECTRODE REM PT RTRN 9FT ADLT (ELECTROSURGICAL) ×1 IMPLANT
FACESHIELD WRAPAROUND (MASK) ×2 IMPLANT
FACESHIELD WRAPAROUND OR TEAM (MASK) ×1 IMPLANT
GAUZE SPONGE 4X4 12PLY STRL (GAUZE/BANDAGES/DRESSINGS) ×4 IMPLANT
GAUZE XEROFORM 5X9 LF (GAUZE/BANDAGES/DRESSINGS) IMPLANT
GLOVE SRG 8 PF TXTR STRL LF DI (GLOVE) ×1 IMPLANT
GLOVE SURG LTX SZ8 (GLOVE) ×2 IMPLANT
GLOVE SURG UNDER POLY LF SZ8 (GLOVE) ×2
GOWN STRL REUS W/ TWL LRG LVL3 (GOWN DISPOSABLE) ×2 IMPLANT
GOWN STRL REUS W/ TWL XL LVL3 (GOWN DISPOSABLE) ×1 IMPLANT
GOWN STRL REUS W/TWL LRG LVL3 (GOWN DISPOSABLE) ×4
GOWN STRL REUS W/TWL XL LVL3 (GOWN DISPOSABLE) ×2
HYDROGEN PEROXIDE 16OZ (MISCELLANEOUS) ×2 IMPLANT
K-WIRE 2X5 SS THRDED S3 (WIRE) ×10
KIT BASIN OR (CUSTOM PROCEDURE TRAY) ×2 IMPLANT
KIT TURNOVER KIT B (KITS) ×2 IMPLANT
KWIRE 2X5 SS THRDED S3 (WIRE) IMPLANT
MANIFOLD NEPTUNE II (INSTRUMENTS) ×2 IMPLANT
NS IRRIG 1000ML POUR BTL (IV SOLUTION) ×2 IMPLANT
PACK SHOULDER (CUSTOM PROCEDURE TRAY) ×2 IMPLANT
PAD ARMBOARD 7.5X6 YLW CONV (MISCELLANEOUS) ×4 IMPLANT
PAD CAST 4YDX4 CTTN HI CHSV (CAST SUPPLIES) IMPLANT
PADDING CAST COTTON 4X4 STRL (CAST SUPPLIES)
PLATE PROX HUMERUS HI LT 4H (Plate) ×1 IMPLANT
PUTTY DBM STAGRAFT PLUS 10CC (Putty) ×2 IMPLANT
SCREW CORTICAL LOW PROF 3.5X32 (Screw) ×2 IMPLANT
SCREW LOCK CORT STAR 3.5X28 (Screw) ×1 IMPLANT
SCREW LOCK CORT STAR 3.5X30 (Screw) ×1 IMPLANT
SCREW LOCK CORT STAR 3.5X32 (Screw) ×3 IMPLANT
SCREW LOCK CORT STAR 3.5X36 (Screw) ×2 IMPLANT
SCREW LOCK CORT STAR 3.5X38 (Screw) ×1 IMPLANT
SCREW LOW PROF TIS 3.5X28MM (Screw) ×2 IMPLANT
SCREW LOW PROFILE 3.5X30MM TIS (Screw) ×1 IMPLANT
SCREW LP NL T15 3.5X26 (Screw) ×2 IMPLANT
SCREW T15 MD 3.5X42MM NS (Screw) ×1 IMPLANT
SCREW T15 MD 3.5X44MM NS (Screw) ×1 IMPLANT
SCREW T15 MD 3.5X50MM NS (Screw) ×1 IMPLANT
SCREW T15 MD 3.5X52MM NS (Screw) ×1 IMPLANT
SET CYSTO W/LG BORE CLAMP LF (SET/KITS/TRAYS/PACK) ×1 IMPLANT
SLING ARM FOAM STRAP LRG (SOFTGOODS) ×1 IMPLANT
SPONGE T-LAP 18X18 ~~LOC~~+RFID (SPONGE) ×4 IMPLANT
SPONGE T-LAP 4X18 ~~LOC~~+RFID (SPONGE) ×1 IMPLANT
STAPLER VISISTAT 35W (STAPLE) IMPLANT
STOCKINETTE IMPERVIOUS 9X36 MD (GAUZE/BANDAGES/DRESSINGS) IMPLANT
STRIP CLOSURE SKIN 1/2X4 (GAUZE/BANDAGES/DRESSINGS) ×1 IMPLANT
SUCTION FRAZIER HANDLE 10FR (MISCELLANEOUS) ×2
SUCTION TUBE FRAZIER 10FR DISP (MISCELLANEOUS) IMPLANT
SUT MAXBRAID #2 CVD NDL (SUTURE) ×1 IMPLANT
SUT MNCRL AB 3-0 PS2 18 (SUTURE) ×1 IMPLANT
SUT VIC AB 2-0 CT1 27 (SUTURE) ×2
SUT VIC AB 2-0 CT1 TAPERPNT 27 (SUTURE) IMPLANT
SUT VIC AB 2-0 CTB1 (SUTURE) ×2 IMPLANT
TOWEL GREEN STERILE (TOWEL DISPOSABLE) ×2 IMPLANT
TOWEL GREEN STERILE FF (TOWEL DISPOSABLE) ×2 IMPLANT
WATER STERILE IRR 1000ML POUR (IV SOLUTION) ×2 IMPLANT
YANKAUER SUCT BULB TIP NO VENT (SUCTIONS) ×1 IMPLANT

## 2020-12-12 NOTE — Anesthesia Procedure Notes (Addendum)
Anesthesia Regional Block: Interscalene brachial plexus block   Pre-Anesthetic Checklist: , timeout performed,  Correct Patient, Correct Site, Correct Laterality,  Correct Procedure, Correct Position, site marked,  Risks and benefits discussed,  Surgical consent,  Pre-op evaluation,  At surgeon's request and post-op pain management  Laterality: Upper and Left  Prep: Maximum Sterile Barrier Precautions used, chloraprep       Needles:  Injection technique: Single-shot  Needle Type: Echogenic Needle     Needle Length: 5cm  Needle Gauge: 21     Additional Needles:   Procedures:,,,, ultrasound used (permanent image in chart),,    Narrative:  Start time: 12/12/2020 2:14 PM End time: 12/12/2020 2:20 PM Injection made incrementally with aspirations every 5 mL.  Performed by: Personally  Anesthesiologist: Trevor Iha, MD  Additional Notes: Block assessed prior to procedure. Patient tolerated procedure well.

## 2020-12-12 NOTE — Transfer of Care (Signed)
Immediate Anesthesia Transfer of Care Note  Patient: [name]  Procedure(s) Performed: OPEN REDUCTION INTERNAL FIXATION (ORIF) LEFT HUMERUS FRACTURE (Left: Shoulder)  Patient Location: PACU  Anesthesia Type:General and Regional  Level of Consciousness: sedated, drowsy and patient cooperative  Airway & Oxygen Therapy: Patient Spontanous Breathing and Patient connected to nasal cannula oxygen  Post-op Assessment: Report given to RN and Post -op Vital signs reviewed and stable  Post vital signs: Reviewed and stable  Last Vitals:  Vitals Value Taken Time  BP 119/71   Temp    Pulse 118 12/12/20 1942  Resp 12 12/12/20 1942  SpO2 100 % 12/12/20 1942  Vitals shown include unvalidated device data.  Last Pain:  Vitals:   12/12/20 1357  TempSrc: Oral  PainSc: 8       Patients Stated Pain Goal: 4 (12/12/20 1357)  Complications: No notable events documented.

## 2020-12-12 NOTE — Anesthesia Procedure Notes (Signed)
Procedure Name: Intubation Date/Time: 12/12/2020 4:40 PM Performed by: Zollie Scale, CRNA Pre-anesthesia Checklist: Patient identified, Emergency Drugs available, Suction available and Patient being monitored Patient Re-evaluated:Patient Re-evaluated prior to induction Oxygen Delivery Method: Circle System Utilized Preoxygenation: Pre-oxygenation with 100% oxygen Induction Type: IV induction Ventilation: Mask ventilation without difficulty Laryngoscope Size: Miller and 2 Grade View: Grade I Tube type: Oral Tube size: 7.0 mm Number of attempts: 1 Airway Equipment and Method: Stylet Placement Confirmation: ETT inserted through vocal cords under direct vision, positive ETCO2 and breath sounds checked- equal and bilateral Secured at: 22 cm Tube secured with: Tape Dental Injury: Teeth and Oropharynx as per pre-operative assessment

## 2020-12-12 NOTE — Op Note (Signed)
Date of Surgery: 12/12/2020  INDICATIONS: [name] is a 40 y.o.-year-old male with a significantly displaced comminuted left proximal humerus fracture after a fall 6 feet off a ladder.  He immediately presented to the emergency room and subsequently went home due to anxiety over being in public.  The risk and benefits of the procedure with discussed in detail and documented in the pre-operative evaluation.  PREOPERATIVE DIAGNOSIS: Comminuted three-part proximal humeral fracture  POSTOPERATIVE DIAGNOSIS: Same.  PROCEDURE: Open reduction internal fixation left proximal humerus  SURGEON: Jordan Deeds MD  ASSISTANT: [name], ATC; necessary for the timely completion of procedure and due to complexity of procedure.  ANESTHESIA:  general  IV FLUIDS AND URINE: See anesthesia record.  ANTIBIOTICS: Ancef 2 g  ESTIMATED BLOOD LOSS: 50 mL.  IMPLANTS:  Implant Name Type Inv. Item Serial No. Manufacturer Lot No. LRB No. Used Action  PUTTY DBM STAGRAFT PLUS 10CC - [id number] Putty PUTTY DBM STAGRAFT PLUS 10CC  [employer](ORTH,TRAU,BIO,SG) [id number] Left 1 Implanted  PUTTY DBM STAGRAFT PLUS 10CC - [id number] Putty PUTTY DBM STAGRAFT PLUS 10CC  [employer](ORTH,TRAU,BIO,SG) [id number] Left 1 Implanted  PLATE PROX HUMERUS HI LT 4H - [id number] Plate PLATE PROX HUMERUS HI LT 4H  [employer](ORTH,TRAU,BIO,SG)  Left 1 Implanted  SCREW LOCK CORT STAR 3.5X32 - [id number] Screw SCREW LOCK CORT STAR 3.5X32  [employer](ORTH,TRAU,BIO,SG)  Left 3 Implanted  SCREW LOCK CORT STAR 3.5X36 - [id number] Screw SCREW LOCK CORT STAR 3.5X36  [employer](ORTH,TRAU,BIO,SG)  Left 1 Implanted  SCREW LOCK CORT STAR 3.5X28 - [id number] Screw SCREW LOCK CORT STAR 3.5X28  [employer](ORTH,TRAU,BIO,SG)  Left 1 Implanted  SCREW LOCK CORT STAR 3.5X30 - [id number] Screw SCREW LOCK CORT STAR 3.5X30  [employer](ORTH,TRAU,BIO,SG)  Left 1 Implanted  SCREW T15 MD 3.5X44MM NS - [id number] Screw SCREW T15 MD 3.5X44MM NS  ZIMMER  RECON(ORTH,TRAU,BIO,SG)  Left 1 Implanted  SCREW T15 MD 3.5X52MM NS - [id number] Screw SCREW T15 MD 3.5X52MM NS  [employer](ORTH,TRAU,BIO,SG)  Left 1 Implanted  SCREW LP NL T15 3.5X26 - [id number] Screw SCREW LP NL T15 3.5X26  [employer](ORTH,TRAU,BIO,SG)  Left 2 Implanted  SCREW T15 MD 3.5X50MM NS - [id number] Screw SCREW T15 MD 3.5X50MM NS  [employer](ORTH,TRAU,BIO,SG)  Left 1 Implanted  SCREW T15 MD 3.5X42MM NS - [id number] Screw SCREW T15 MD 3.5X42MM NS  [employer](ORTH,TRAU,BIO,SG)  Left 1 Implanted  SCREW LOW PROF TIS 3.5X28MM - [id number] Screw SCREW LOW PROF TIS 3.5X28MM  [employer](ORTH,TRAU,BIO,SG)  Left 1 Implanted    DRAINS: None  CULTURES: None  COMPLICATIONS: none  DESCRIPTION OF PROCEDURE:  The patient was identified in the preoperative holding area the correct site was marked according to universal protocol with nursing staff.  Anesthesia performed an Exparel shoulder block.  Is subsequently taken back to the operating room and again the correct site was identified.  He was placed in a supine position with a bump under the scapula.  He was prepped and draped in the usual sterile fashion.  Deltopectoral approach was utilized centered over the coracoid distally to the deltoid insertion.  15 blade was used to incise through skin.  Electrocautery was utilized to cauterize any features of the cephalic vein.  Cautery was used layer by layer to identify and protect the cephalic vein.  This was retracted medially.  The fracture was identified and hematoma was thoroughly irrigated.  Of note the proximal bone was of quite poor quality of the greater tuberosity was fragmented into multiple pieces.  Nonabsorbable #  2 Ortho cord was used to suture the rotator cuff insertion at these fragments of the greater tuberosity then these were sutured together and into the plate.  We performed a reduction by initially placing the plate distally and then reducing the head segment to the plate.  Once  this was done a central K wire was placed in the head for provisional fixation.  We placed locking screws in the proximal segment under direct fluoroscopic view in order to achieve as long of calcar and central head screws as possible without perforating the humeral head.  An additional 4 screws was placed in the greater tuberosity given the overall poor quality of the bone.  Once were happy with this attention was again turned distally to place the additional distal 2 nonlocking screws.  These all had excellent bite.  Given the poor quality of the bone and the large void of bone and the humeral head stay graft DBM putty was then used in the head to fill the remaining void.  Once this was completed we thoroughly irrigated the wounds.  1 g of vancomycin powder was placed.  A layered closure was performed using 0 Vicryl in the deltopectoral interval followed by 2-0 Vicryl and a 3-0 Monocryl.  A Aquacel dressing was placed.  The patient was awoken and taken to the PACU in stable condition.  All counts were correct at the end of the case    [name] ATC was necessary for opening, closing, retracting, limb positioning and overall facilitation and timely completion of the procedure.     POSTOPERATIVE PLAN: He will be strict nonweightbearing in a sling.  I do not want him coming out of the sling routinely.  He will be out only for hygiene.  He will be absolutely no passive range of motion for the first 4 weeks  Jordan Deeds, MD 7:23 PM

## 2020-12-12 NOTE — Interval H&P Note (Signed)
History and Physical Interval Note:  12/12/2020 4:27 PM  [name]  has presented today for surgery, with the diagnosis of left proximal humerus fracture.  The various methods of treatment have been discussed with the patient and family. After consideration of risks, benefits and other options for treatment, the patient has consented to  Procedure(s) with comments: OPEN REDUCTION INTERNAL FIXATION (ORIF) LEFT HUMERUS FRACTURE (Left) - estimated operating time 2 hours. as a surgical intervention.  The patient's history has been reviewed, patient examined, no change in status, stable for surgery.  I have reviewed the patient's chart and labs.  Questions were answered to the patient's satisfaction.     [name]

## 2020-12-12 NOTE — Brief Op Note (Signed)
   Brief Op Note  Date of Surgery: 12/12/2020  Preoperative Diagnosis: left proximal humerus fracture  Postoperative Diagnosis: same  Procedure: Procedure(s): OPEN REDUCTION INTERNAL FIXATION (ORIF) LEFT HUMERUS FRACTURE  Implants: Implant Name Type Inv. Item Serial No. Manufacturer Lot No. LRB No. Used Action  PUTTY DBM STAGRAFT PLUS 10CC - [id number] Putty PUTTY DBM STAGRAFT PLUS 10CC  [employer](ORTH,TRAU,BIO,SG) [id number] Left 1 Implanted  PUTTY DBM STAGRAFT PLUS 10CC - [id number] Putty PUTTY DBM STAGRAFT PLUS 10CC  [employer]MER [employer](ORTH,TRAU,BIO,SG) [id number] Left 1 Implanted  PLATE PROX HUMERUS HI LT 4H - [id number] Plate PLATE PROX HUMERUS HI LT 4H  [employer]MER [employer](ORTH,TRAU,BIO,SG)  Left 1 Implanted  SCREW LOCK CORT STAR 3.5X32 - [id number] Screw SCREW LOCK CORT STAR 3.5X32  [employer]MER [employer](ORTH,TRAU,BIO,SG)  Left 3 Implanted  SCREW LOCK CORT STAR 3.5X36 - [id number] Screw SCREW LOCK CORT STAR 3.5X36  [employer]MER [employer](ORTH,TRAU,BIO,SG)  Left 1 Implanted  SCREW LOCK CORT STAR 3.5X28 - [id number] Screw SCREW LOCK CORT STAR 3.5X28  Z[employer]MER [employer](ORTH,TRAU,BIO,SG)  Left 1 Implanted  SCREW LOCK CORT STAR 3.5X30 - [id number] Screw SCREW LOCK CORT STAR 3.5X30  Z[employer]MER [employer](ORTH,TRAU,BIO,SG)  Left 1 Implanted  SCREW T15 MD 3.5X44MM NS - [id number] Screw SCREW T15 MD 3.5X44MM NS  Z[employer]MER [employer](ORTH,TRAU,BIO,SG)  Left 1 Implanted  SCREW T15 MD 3.5X52MM NS - [id number] Screw SCREW T15 MD 3.5X52MM NS  ZIMMER [employer]ON(ORTH,TRAU,BIO,SG)  Left 1 Implanted  SCREW LP NL T15 3.5X26 - [id number] Screw SCREW LP NL T15 3.5X26  ZIMMER [employer]ON(ORTH,TRAU,BIO,SG)  Left 2 Implanted  SCREW T15 MD 3.5X50MM NS - [id number] Screw SCREW T15 MD 3.5X50MM NS  ZIMMER [employer]ON(ORTH,TRAU,BIO,SG)  Left 1 Implanted  SCREW T15 MD 3.5X42MM NS - [id number] Screw SCREW T15 MD 3.5X42MM NS  ZIMMER [employer]ON(ORTH,TRAU,BIO,SG)  Left 1 Implanted  SCREW LOW PROF TIS 3.5X28MM - [id number] Screw SCREW LOW PROF TIS 3.5X28MM  ZIMMER [employer]ON(ORTH,TRAU,BIO,SG)  Left 1 Implanted     Surgeons: Surgeon(s): [name], MD  Anesthesia: Choice    Estimated Blood Loss: See anesthesia record  Complications: None  Condition to PACU: Stable  Benancio Deeds, MD 12/12/2020 7:22 PM

## 2020-12-12 NOTE — Discharge Instructions (Signed)
     Discharge Instructions    Attending Surgeon: [name], MD Office Phone Number: [phone]   Diagnosis and Procedures:    Surgeries Performed: Left shoulder proximal humerus open reduction internal fixation  Discharge Plan:    Diet: Resume usual diet. Begin with light or bland foods.  Drink plenty of fluids.  Activity:  Keep sling and dressing in place until your follow up visit in Physical Therapy. NON weight bearing left arm You are advised to go home directly from the hospital or surgical center. Restrict your activities.  GENERAL INSTRUCTIONS: 1.  Keep your surgical site elevated above your heart for at least 5-7 days or longer to prevent swelling. This will improve your comfort and your overall recovery following surgery.     2. Please call Dr. Serena Croissant office at [phone] with questions Monday-Friday during business hours. If no one answers, please leave a message and someone should get back to the patient within 24 hours. For emergencies please call 911 or proceed to the emergency room.   3. Patient to notify surgical team if experiences any of the following: Bowel/Bladder dysfunction, uncontrolled pain, nerve/muscle weakness, incision with increased drainage or redness, nausea/vomiting and Fever greater than 101.0 F.  Be alert for signs of infection including redness, streaking, odor, fever or chills. Be alert for excessive pain or bleeding and notify your surgeon immediately.  WOUND INSTRUCTIONS:   Leave your dressing/cast/splint in place until your post operative visit.  Keep it clean and dry.  Always keep the incision clean and dry until the staples/sutures are removed. If there is no drainage from the incision you should keep it open to air. If there is drainage from the incision you must keep it covered at all times until the drainage stops  Do not soak in a bath tub, hot tub, pool, lake or other body of water until 21 days after your surgery and your  incision is completely dry and healed.  If you have removable sutures (or staples) they must be removed 10-14 days (unless otherwise instructed) from the day of your surgery.     1)  Elevate the extremity as much as possible.  2)  Keep the dressing clean and dry.  3)  Please call us if the dressing becomes wet or dirty.  4)  If you are experiencing worsening pain or worsening swelling, please call.     MEDICATIONS: Resume all previous home medications at the previous prescribed dose and frequency unless otherwise noted Start taking the  pain medications on an as-needed basis as prescribed  Please taper down pain medication over the next week following surgery.  Ideally you should not require a refill of any narcotic pain medication.  Take pain medication with food to minimize nausea. In addition to the prescribed pain medication, you may take over-the-counter pain relievers such as Tylenol.  Do NOT take additional tylenol if your pain medication already has tylenol in it.  Aspirin 325mg  daily for four weeks.      FOLLOWUP INSTRUCTIONS: 1. Follow up at the Physical Therapy Clinic 3-4 days following surgery. This appointment should be scheduled unless other arrangements have been made.The Physical Therapy scheduling number is [phone] if an appointment has not already been arranged.  2. Contact Dr. [phone] office during office hours at [phone] or the practice after hours line at [phone] for non-emergencies. For medical emergencies call 911.   Discharge Location: Home

## 2020-12-12 NOTE — Anesthesia Preprocedure Evaluation (Addendum)
Anesthesia Evaluation  Patient identified by MRN, date of birth, ID band Patient awake    Reviewed: Allergy & Precautions, NPO status , Patient's Chart, lab work & pertinent test results  Airway Mallampati: II  TM Distance: >3 FB Neck ROM: Full    Dental no notable dental hx. (+) Edentulous Upper, Edentulous Lower   Pulmonary Current Smoker,    Pulmonary exam normal breath sounds clear to auscultation       Cardiovascular Exercise Tolerance: Good negative cardio ROS Normal cardiovascular exam Rhythm:Regular Rate:Normal     Neuro/Psych  Headaches, Anxiety    GI/Hepatic negative GI ROS, GERD  ,(+)     substance abuse (HX currently on Suboxone DC wed 10/19)  cocaine use and marijuana use,   Endo/Other  negative endocrine ROS  Renal/GU      Musculoskeletal negative musculoskeletal ROS (+) narcotic dependent  Abdominal   Peds  Hematology Lab Results      Component                Value               Date                      WBC                      10.3                12/04/2020                HGB                      11.8 (L)            12/04/2020                HCT                      36.7 (L)            12/04/2020                MCV                      94.1                12/04/2020                PLT                      227                 12/04/2020              Anesthesia Other Findings   Reproductive/Obstetrics                            Anesthesia Physical Anesthesia Plan  ASA: 2  Anesthesia Plan: General and Regional   Post-op Pain Management:  Regional for Post-op pain   Induction: Intravenous  PONV Risk Score and Plan: 2 and Midazolam, Ondansetron and Treatment may vary due to age or medical condition  Airway Management Planned: Oral ETT  Additional Equipment: None  Intra-op Plan:   Post-operative Plan: Extubation in OR  Informed Consent: I have reviewed the  patients History and Physical, chart, labs and discussed the procedure including the risks,  benefits and alternatives for the proposed anesthesia with the patient or authorized representative who has indicated his/her understanding and acceptance.     Dental advisory given  Plan Discussed with:   Anesthesia Plan Comments: (GA w L ISB)       Anesthesia Quick Evaluation

## 2020-12-13 ENCOUNTER — Telehealth: Payer: Self-pay | Admitting: Orthopaedic Surgery

## 2020-12-13 ENCOUNTER — Encounter (HOSPITAL_COMMUNITY): Payer: Self-pay | Admitting: Orthopaedic Surgery

## 2020-12-13 ENCOUNTER — Other Ambulatory Visit (HOSPITAL_BASED_OUTPATIENT_CLINIC_OR_DEPARTMENT_OTHER): Payer: Self-pay | Admitting: Orthopaedic Surgery

## 2020-12-13 ENCOUNTER — Other Ambulatory Visit (HOSPITAL_COMMUNITY): Payer: Self-pay

## 2020-12-13 MED ORDER — TAMSULOSIN HCL 0.4 MG PO CAPS
0.4000 mg | ORAL_CAPSULE | Freq: Every day | ORAL | 0 refills | Status: AC
  Filled 2020-12-13: qty 10, 10d supply, fill #0

## 2020-12-13 MED ORDER — OXYCODONE HCL 15 MG PO TABS
15.0000 mg | ORAL_TABLET | ORAL | 0 refills | Status: DC | PRN
  Filled 2020-12-13 – 2020-12-15 (×7): qty 40, 7d supply, fill #0

## 2020-12-13 MED ORDER — OXYCODONE HCL 15 MG PO TABS
15.0000 mg | ORAL_TABLET | ORAL | 0 refills | Status: DC | PRN
  Filled 2020-12-13: qty 40, 7d supply, fill #0

## 2020-12-13 MED ORDER — OXYCODONE HCL ER 10 MG PO T12A
10.0000 mg | EXTENDED_RELEASE_TABLET | Freq: Two times a day (BID) | ORAL | 0 refills | Status: DC
  Filled 2020-12-13 – 2020-12-16 (×6): qty 30, 15d supply, fill #0

## 2020-12-13 MED ORDER — OXYCODONE HCL ER 10 MG PO T12A
10.0000 mg | EXTENDED_RELEASE_TABLET | Freq: Two times a day (BID) | ORAL | 0 refills | Status: DC
  Filled 2020-12-13: qty 30, 15d supply, fill #0

## 2020-12-13 NOTE — Telephone Encounter (Signed)
I have discussed this with Dr Steward Drone.

## 2020-12-13 NOTE — Telephone Encounter (Signed)
Pt mother is at pharmacy and states she can't get pain medication for pt until pharmacy speaks with Dr.Bokshan. She would really like him to contact them asap.   CB [phone]

## 2020-12-13 NOTE — Telephone Encounter (Signed)
Pt mother called again and states that they're still waiting on prior auth from [place]. Is there anyway you can look into that? She is waiting at [facility].   CB [phone]

## 2020-12-13 NOTE — Telephone Encounter (Signed)
Sent prior auth to pharmacy and called patient's mom and informed her

## 2020-12-14 ENCOUNTER — Telehealth: Payer: Self-pay | Admitting: Orthopaedic Surgery

## 2020-12-14 ENCOUNTER — Other Ambulatory Visit (HOSPITAL_COMMUNITY): Payer: Self-pay

## 2020-12-14 NOTE — Telephone Encounter (Signed)
Pt called stating he called the pharmacy and they told him medicaid hadn't approved the rxs yet. He would like to know if there's anything we can do to over ride medicaid? He states he'll pay cash if he has to; and he wouldlike a CB ASAP.

## 2020-12-14 NOTE — Telephone Encounter (Signed)
Patient's mom called. Says he is in a lot of pain. Says the pharmacy does not have the auth for the pain mediation. She would like a call back. [phone]

## 2020-12-14 NOTE — Anesthesia Postprocedure Evaluation (Signed)
Anesthesia Post Note  Patient: [name]  Procedure(s) Performed: OPEN REDUCTION INTERNAL FIXATION (ORIF) LEFT HUMERUS FRACTURE (Left: Shoulder)     Patient location during evaluation: PACU Anesthesia Type: Regional and General Level of consciousness: awake and alert Pain management: pain level controlled Vital Signs Assessment: post-procedure vital signs reviewed and stable Respiratory status: spontaneous breathing, nonlabored ventilation, respiratory function stable and patient connected to nasal cannula oxygen Cardiovascular status: blood pressure returned to baseline and stable Postop Assessment: no apparent nausea or vomiting Anesthetic complications: no   No notable events documented.  Last Vitals:  Vitals:   12/12/20 2010 12/12/20 2025  BP: 120/70 113/75  Pulse: (!) 104 (!) 107  Resp: 14 13  Temp:  36.5 C  SpO2: 99% 100%    Last Pain:  Vitals:   12/12/20 2025  TempSrc:   PainSc: 0-No pain                 Aadhav Uhlig S

## 2020-12-15 ENCOUNTER — Telehealth: Payer: Self-pay

## 2020-12-15 ENCOUNTER — Other Ambulatory Visit (HOSPITAL_COMMUNITY): Payer: Self-pay

## 2020-12-15 ENCOUNTER — Telehealth: Payer: Self-pay | Admitting: Orthopaedic Surgery

## 2020-12-15 NOTE — Telephone Encounter (Signed)
Do we have an update on the authorization for the Rx?  If not approved through [facility], can another Rx be sent to the pharmacy that will be covered?  Cb# [phone].  Please advise.  Thank you.

## 2020-12-15 NOTE — Telephone Encounter (Signed)
Patient's mom [name] called again stating that she spoke with Medicaid and was advised that  the Rx has been approved, but they needed to speak with Dr. Steward Drone for Rx to be filled. CB# [phone].  Please advise.  Thank you.

## 2020-12-15 NOTE — Telephone Encounter (Signed)
Patient's mom Aggie Cosier keeps calling concerning Rx for patient.  She would like to know if another Rx for pain can be sent into the pharmacy that Medicaid will approve immediately. Would like a call back ASAP?  Cb# [phone].  Please advise.  Thank you.

## 2020-12-15 NOTE — Telephone Encounter (Signed)
Noted.  Spoke with the pharmacy and was advised that a PA is needed for Oxycodone 10mg .  of this and she stated that she would send the PA.

## 2020-12-15 NOTE — Telephone Encounter (Signed)
Patient's mom called. Says Dr. Steward Drone needs to call [employer] to get medication approved. [phone]

## 2020-12-16 ENCOUNTER — Telehealth: Payer: Self-pay

## 2020-12-16 ENCOUNTER — Other Ambulatory Visit (HOSPITAL_COMMUNITY): Payer: Self-pay

## 2020-12-16 ENCOUNTER — Telehealth: Payer: Self-pay | Admitting: Orthopaedic Surgery

## 2020-12-16 NOTE — Telephone Encounter (Signed)
Pt called regarding a rx for percocet that was on his discharge summary that he needs to pick up can you please advise. There is nothing at the pharm at this time

## 2020-12-16 NOTE — Telephone Encounter (Signed)
Pt's mom called stating that patient was bleeding although she used two gauze pads along with a bandage to stop it.   Pt's mom stated he cant go to the hospital, she just wanted to know what he needs to do over the weekend.

## 2020-12-16 NOTE — Telephone Encounter (Signed)
error 

## 2020-12-16 NOTE — Telephone Encounter (Signed)
Pt s/p ORIF left humeral fx 12/12/20. Pt's mother called today with c/o drainage from incision and post operative bandage coming apart and wants direction on what to do. CB # [phone]

## 2020-12-16 NOTE — Telephone Encounter (Signed)
Spoke to patient's mom and explained and updated her on the prior authorization system

## 2020-12-16 NOTE — Telephone Encounter (Signed)
Pt's mother Aggie Cosier called requesting a call back concerning her son's refills for pre authorization with pharmacy. Please call her at [phone].

## 2020-12-16 NOTE — Telephone Encounter (Signed)
Spoke to patient mom about keeping the bandages clean, covered and dry with gauze pads. She didn't have any further questions and was understanding

## 2020-12-26 ENCOUNTER — Other Ambulatory Visit (HOSPITAL_BASED_OUTPATIENT_CLINIC_OR_DEPARTMENT_OTHER): Payer: Self-pay | Admitting: Orthopaedic Surgery

## 2020-12-26 ENCOUNTER — Ambulatory Visit (HOSPITAL_BASED_OUTPATIENT_CLINIC_OR_DEPARTMENT_OTHER): Payer: Medicaid Other | Attending: Orthopaedic Surgery | Admitting: Physical Therapy

## 2020-12-26 ENCOUNTER — Other Ambulatory Visit (HOSPITAL_BASED_OUTPATIENT_CLINIC_OR_DEPARTMENT_OTHER): Payer: Self-pay

## 2020-12-26 ENCOUNTER — Encounter (HOSPITAL_BASED_OUTPATIENT_CLINIC_OR_DEPARTMENT_OTHER): Payer: Self-pay | Admitting: Orthopaedic Surgery

## 2020-12-26 ENCOUNTER — Ambulatory Visit (INDEPENDENT_AMBULATORY_CARE_PROVIDER_SITE_OTHER): Payer: Medicaid Other | Admitting: Orthopaedic Surgery

## 2020-12-26 ENCOUNTER — Other Ambulatory Visit: Payer: Self-pay

## 2020-12-26 ENCOUNTER — Ambulatory Visit (HOSPITAL_BASED_OUTPATIENT_CLINIC_OR_DEPARTMENT_OTHER)
Admission: RE | Admit: 2020-12-26 | Discharge: 2020-12-26 | Disposition: A | Discharge status: Home / Self Care | Payer: Medicaid Other | Source: Ambulatory Visit | Attending: Orthopaedic Surgery | Admitting: Orthopaedic Surgery

## 2020-12-26 DIAGNOSIS — S42292A Other displaced fracture of upper end of left humerus, initial encounter for closed fracture: Secondary | ICD-10-CM | POA: Diagnosis present

## 2020-12-26 MED ORDER — METHOCARBAMOL 500 MG PO TABS
ORAL_TABLET | ORAL | 2 refills | Status: DC
  Filled 2020-12-26: qty 30, 15d supply, fill #0

## 2020-12-26 NOTE — Therapy (Incomplete)
OUTPATIENT PHYSICAL THERAPY SHOULDER EVALUATION   Patient Name: [name] MRN: [id number] DOB:20-Jan-1981, 40 y.o., male Today's Date: 12/26/2020    Past Medical History:  Diagnosis Date   ADHD (attention deficit hyperactivity disorder)    ADHD   Anemia    Anxiety    Benzodiazepine abuse (HCC)    Chronic tachycardia    Per Dr. Doreene Eland (psych)   Complication of anesthesia    was hard to put him under anesthesia   Depression    clinical depression   Drug use    GERD (gastroesophageal reflux disease)    Headache    History of IBS    Mood disorder (HCC)    Pneumonia    2017, lung collapse   Polysubstance abuse (HCC)    PTSD (post-traumatic stress disorder)    Past Surgical History:  Procedure Laterality Date   ADENOIDECTOMY     INNER EAR SURGERY     MULTIPLE TOOTH EXTRACTIONS     ORIF HUMERUS FRACTURE Left 12/12/2020   Procedure: OPEN REDUCTION INTERNAL FIXATION (ORIF) LEFT HUMERUS FRACTURE;  Surgeon: [name], MD;  Location: [facility] OR;  Service: Orthopedics;  Laterality: Left;  estimated operating time 2 hours.   WISDOM TOOTH EXTRACTION     Patient Active Problem List   Diagnosis Date Noted   Closed 4-part fracture of proximal humerus, left, initial encounter 12/07/2020   Rib fractures 12/04/2020   Cellulitis of multiple sites of upper arm 10/2018   Opioid abuse with opioid-induced mood disorder (HCC) 10/06/2017   Depression    PTSD (post-traumatic stress disorder)    Tachycardia    Acute blood loss anemia    Weakness 09/12/2015   Respiratory failure, acute (HCC)    Respiratory failure (HCC)    Atelectasis    Altered mental status 09/03/2015   Opioid overdose (HCC) 08/29/2015   Leukocytosis 08/29/2015   Anemia 08/29/2015   Polysubstance abuse (HCC) 08/29/2015   Tobacco abuse 08/29/2015    PCP: [facility]  REFERRING PROVIDER: [name], MD  REFERRING DIAG: S42.292A (ICD-10-CM) - Closed 4-part fracture of proximal humerus,  left, initial encounter  THERAPY DIAG:  No diagnosis found.   ONSET DATE: 12/12/20 ORIF  SUBJECTIVE:                                                                                                                                                                                      SUBJECTIVE STATEMENT: left proximal humerus fracture after a fall 6 feet off a ladder.  some muscle spasms for which she has been taking the arm out of the sling  PERTINENT HISTORY: Anxiety, depression, ADHD, PTSD, agoraphobia, smoker  PAIN:  Are you having pain? {yes/no:20286} VAS scale: ***/10 Pain location: *** Pain orientation: {Pain Orientation:25161}  PAIN TYPE: {type:313116} Pain description: {PAIN DESCRIPTION:21022940}  Aggravating factors: *** Relieving factors: ***  PRECAUTIONS: Shoulder, sling for 3 weeks, no A/PROM for first 4 weeks  WEIGHT BEARING RESTRICTIONS Yes NWB  FALLS:  Has patient fallen in last 6 months? Yes Number of falls: 1- fall off ladder 10/24  LIVING ENVIRONMENT: Lives with: {[facility] lives with:25569::"lives with their family"} Lives in: {Lives in:25570} Stairs: {yes/no:20286}; {Stairs:24000} Has following equipment at home: {Assistive devices:23999}  PLOF: {PLOF:24004}  PATIENT GOALS ***  OBJECTIVE:   DIAGNOSTIC FINDINGS:  Per MD note-   "2 views left humerus: There is settling of the humeral head and partial screw penetration as result.  The greater tuberosity has also migrated."  PATIENT SURVEYS:  FOTO ***  COGNITION:  Overall cognitive status: Within functional limits for tasks assessed     SENSATION:  Light touch: {intact/deficits:24005}  Stereognosis: {intact/deficits:24005}  Hot/Cold: {intact/deficits:24005}  Proprioception: {intact/deficits:24005}  PALPATION: ***  POSTURE: ***  UPPER EXTREMITY AROM/PROM:  A/PROM Right 12/26/2020 Left 12/26/2020  Shoulder flexion    Shoulder extension    Shoulder abduction    Shoulder adduction     Shoulder extension    Shoulder internal rotation    Shoulder external rotation    Elbow flexion    Elbow extension    Wrist flexion    Wrist extension    Wrist ulnar deviation    Wrist radial deviation    Wrist pronation    Wrist supination    (Blank rows = not tested)  UPPER EXTREMITY MMT: untested due to precautions  JOINT MOBILITY TESTING:  No PROM for first 4 weeks  PALPATION:  ***  POSTURE:  ***   TODAY'S TREATMENT:  ***   PATIENT EDUCATION: Education details: MOI, precautions/protocol, diagnosis, prognosis, anatomy, exercise progression, DOMS expectations, muscle firing,  envelope of function, HEP, POC  Person educated: {Person educated:25204} Education method: {Education Method:25205} Education comprehension: {Education Comprehension:25206}   HOME EXERCISE PROGRAM: ***  ASSESSMENT:  CLINICAL IMPRESSION: Patient is a 40 y.o. male who was seen today for physical therapy evaluation and treatment for ***. Objective impairments include {opptimpairments:25111}. These impairments are limiting patient from {activity limitations:25113}. Personal factors including {Personal factors:25162} are also affecting patient's functional outcome. Patient will benefit from skilled PT to address above impairments and improve overall function.  REHAB POTENTIAL: {rehabpotential:25112}  CLINICAL DECISION MAKING: {clinical decision making:25114}  EVALUATION COMPLEXITY: {Evaluation complexity:25115}   GOALS:  SHORT TERM GOALS:  STG Name Target Date Goal status  1 Pt will become independent with HEP in order to demonstrate synthesis of PT education.   01/09/2021 INITIAL  2 Pt will score at least *** pt increase on FOTO to demonstrate functional improvement in MCII and pt perceived function.    01/23/2021 INITIAL  3 Pt will be able to demonstrate ability to maintain shoulder precautions for first 4 weeks in order to demonstrate synthesis of PT education and understanding of  injury risk.    01/23/2021 INITIAL  4 Pt will be able to demonstrate ___ in order to demonstrate functional improvement in UE/LE function for self-care and house hold duties.   01/23/2021 INITIAL   LONG TERM GOALS:   LTG Name Target Date Goal status  1 Pt  will become independent with final HEP in order to demonstrate synthesis of PT education.   02/20/2021 INITIAL  2 Pt will be able to demonstrate ___ in order to demonstrate  functional improvement in UE/LE function for self-care and house hold duties.   02/20/2021 INITIAL  3 Pt will be able to demonstrate ___ in order to demonstrate functional improvement in UE/LE function for self-care and house hold duties.   02/20/2021 INITIAL  4 Pt will score >/= *** on FOTO to demonstrate functional improvement in L UE function.   02/20/2021 INITIAL   PLAN: PT FREQUENCY: 2x/week  PT DURATION: 8 weeks  PLANNED INTERVENTIONS: Therapeutic exercises, Therapeutic activity, Neuro Muscular re-education, Patient/Family education, Joint mobilization, Orthotic/Fit training, DME instructions, Aquatic Therapy, Dry Needling, Electrical stimulation, Spinal mobilization, Cryotherapy, Moist heat, scar mobilization, Taping, Vasopneumatic device, Traction, Ultrasound, Ionotophoresis 4mg /ml Dexamethasone, and Manual therapy  PLAN FOR NEXT SESSION: ***  PT, DPT 12/26/20 2:11 PM

## 2020-12-26 NOTE — Progress Notes (Unsigned)
Dg  

## 2020-12-26 NOTE — Progress Notes (Signed)
Post Operative Evaluation    Procedure/Date of Surgery: 12/12/20  Interval History:   40 year old male presents today 2-week status post open reduction internal fixation of the left humerus.  Pain has been better controlled.  He is endorsing some muscle spasms for which she has been taking the arm out of the sling.  Denies any active range of motion.   PMH/PSH/Family History/Social History/Meds/Allergies:    Past Medical History:  Diagnosis Date   ADHD (attention deficit hyperactivity disorder)    ADHD   Anemia    Anxiety    Benzodiazepine abuse (HCC)    Chronic tachycardia    Per Dr. Doreene Eland (psych)   Complication of anesthesia    was hard to put him under anesthesia   Depression    clinical depression   Drug use    GERD (gastroesophageal reflux disease)    Headache    History of IBS    Mood disorder (HCC)    Pneumonia    2017, lung collapse   Polysubstance abuse (HCC)    PTSD (post-traumatic stress disorder)    Past Surgical History:  Procedure Laterality Date   ADENOIDECTOMY     INNER EAR SURGERY     MULTIPLE TOOTH EXTRACTIONS     ORIF HUMERUS FRACTURE Left 12/12/2020   Procedure: OPEN REDUCTION INTERNAL FIXATION (ORIF) LEFT HUMERUS FRACTURE;  Surgeon: [name], MD;  Location: [facility] OR;  Service: Orthopedics;  Laterality: Left;  estimated operating time 2 hours.   WISDOM TOOTH EXTRACTION     Social History   Socioeconomic History   Marital status: Significant Other    Spouse name: Not on file   Number of children: Not on file   Years of education: Not on file   Highest education level: Not on file  Occupational History   Occupation: disabled  Tobacco Use   Smoking status: Every Day    Packs/day: 0.50    Types: Cigarettes   Smokeless tobacco: Never  Vaping Use   Vaping Use: Former  Substance and Sexual Activity   Alcohol use: Never   Drug use: Yes    Types: IV, Cocaine, Marijuana, Amphetamines, Hydrocodone,  Oxycodone    Comment: no longer uses cocaine or marijuana   Sexual activity: Yes    Birth control/protection: Condom  Other Topics Concern   Not on file  Social History Narrative   n/a   Social Determinants of Health   Financial Resource Strain: Not on file  Food Insecurity: Not on file  Transportation Needs: Not on file  Physical Activity: Not on file  Stress: Not on file  Social Connections: Not on file   No family history on file. Allergies  Allergen Reactions   Compazine [Prochlorperazine Edisylate] Other (See Comments)    Blood pressure drops   Haldol [Haloperidol]     Restless legs   Phenergan [Promethazine Hcl] Other (See Comments)    Blood pressure drops    Reglan [Metoclopramide]     Restless legs   Tramadol Other (See Comments)    Blood pressure drops    Current Outpatient Medications  Medication Sig Dispense Refill   albuterol (VENTOLIN HFA) 108 (90 Base) MCG/ACT inhaler Inhale 1-2 puffs into the lungs every 6 (six) hours as needed for wheezing or shortness of breath.     aspirin EC 325 MG  tablet Take 1 tablet (325 mg total) by mouth daily. 30 tablet 0   buprenorphine-naloxone (SUBOXONE) 8-2 mg SUBL SL tablet Place 1-2 tablets under the tongue See admin instructions. 2 films in the morning 1 film in the evening     Fluvoxamine Maleate 100 MG CP24 Take 200 mg by mouth daily. (Patient not taking: No sig reported)     gabapentin (NEURONTIN) 600 MG tablet Take 600 mg by mouth 3 (three) times daily.     ibuprofen (ADVIL) 200 MG tablet Take 800 mg by mouth every 6 (six) hours as needed for headache or moderate pain.     LORazepam (ATIVAN) 1 MG tablet Take 1 mg by mouth 4 (four) times daily.     naloxone (NARCAN) nasal spray 4 mg/0.1 mL Place into the nose. 2 each 0   naproxen (NAPROSYN) 500 MG tablet Take 500 mg by mouth 2 (two) times daily as needed for moderate pain.     naproxen sodium (ALEVE) 220 MG tablet Take 1 tablet (220 mg total) by mouth 2 (two) times daily  as needed. 30 tablet 3   ondansetron (ZOFRAN ODT) 4 MG disintegrating tablet Take 1 tablet (4 mg total) by mouth every 8 (eight) hours as needed for nausea or vomiting. 10 tablet 0   oxyCODONE (OXYCONTIN) 10 mg 12 hr tablet Take 1 tablet (10 mg total) by mouth every 12 (twelve) hours. 30 tablet 0   oxyCODONE (ROXICODONE) 15 MG immediate release tablet Take 1 tablet (15 mg total) by mouth every 4 (four) hours as needed for pain. 40 tablet 0   oxyCODONE-acetaminophen (PERCOCET) 5-325 MG tablet Take 1-2 tablets by mouth every 8 (eight) hours as needed for severe pain. 30 tablet 0   polyethylene glycol powder (GLYCOLAX/MIRALAX) 17 GM/SCOOP powder Take 17 g by mouth at bedtime.     temazepam (RESTORIL) 15 MG capsule Take 15 mg by mouth at bedtime.     triamcinolone (KENALOG) 0.025 % cream Apply 1 application topically 2 (two) times daily as needed (for rash).     No current facility-administered medications for this visit.   No results found.  Review of Systems:   A ROS was performed including pertinent positives and negatives as documented in the HPI.   Musculoskeletal Exam:    There were no vitals taken for this visit.  Left incision is clean dry and intact without erythema or drainage.  Able to extend at the left wrist and thumb.  Sensation is intact over the axillary distribution  Imaging:    2 views left humerus: There is settling of the humeral head and partial screw penetration as result.  The greater tuberosity has also migrated.  I personally reviewed and interpreted the radiographs.   Assessment:   40 year old male status post left proximal humerus open reduction internal fixation 2 weeks postop.  He has been out of his sling even though I have advised that he should begin this at all times.  I described that there is some head settling of the humeral head and the risk of intra-articular screw penetration.  I described that ideally if the fracture can heal in this position he may  be a candidate for removal of hardware down the line.  We did discuss the possibility of this happening given that his bone was essentially an eggshell at the time of surgery with regard to the humeral head.  With that being said I am very hesitant to perform reverse shoulder arthroplasty given his young age.  Plan :    -I will follow him closely and see him back in 2 weeks with x-rays     I personally saw and evaluated the patient, and participated in the management and treatment plan.  [name], MD Attending Physician, Orthopedic Surgery  This document was dictated using Dragon voice recognition software. A reasonable attempt at proof reading has been made to minimize errors.

## 2020-12-30 ENCOUNTER — Ambulatory Visit (HOSPITAL_BASED_OUTPATIENT_CLINIC_OR_DEPARTMENT_OTHER)
Admission: RE | Admit: 2020-12-30 | Discharge: 2020-12-30 | Disposition: A | Discharge status: Home / Self Care | Payer: Medicaid Other | Source: Ambulatory Visit | Attending: Orthopaedic Surgery | Admitting: Orthopaedic Surgery

## 2020-12-30 ENCOUNTER — Other Ambulatory Visit (HOSPITAL_BASED_OUTPATIENT_CLINIC_OR_DEPARTMENT_OTHER): Payer: Self-pay | Admitting: Orthopaedic Surgery

## 2020-12-30 ENCOUNTER — Other Ambulatory Visit (HOSPITAL_COMMUNITY): Payer: Self-pay

## 2020-12-30 ENCOUNTER — Ambulatory Visit (INDEPENDENT_AMBULATORY_CARE_PROVIDER_SITE_OTHER): Payer: Medicaid Other | Admitting: Orthopaedic Surgery

## 2020-12-30 ENCOUNTER — Other Ambulatory Visit: Payer: Self-pay

## 2020-12-30 ENCOUNTER — Encounter (HOSPITAL_BASED_OUTPATIENT_CLINIC_OR_DEPARTMENT_OTHER): Payer: Medicaid Other | Admitting: Orthopaedic Surgery

## 2020-12-30 DIAGNOSIS — S42292A Other displaced fracture of upper end of left humerus, initial encounter for closed fracture: Secondary | ICD-10-CM

## 2020-12-30 MED ORDER — CARISOPRODOL 350 MG PO TABS
350.0000 mg | ORAL_TABLET | Freq: Four times a day (QID) | ORAL | 2 refills | Status: DC | PRN
  Filled 2020-12-30 (×2): qty 30, 8d supply, fill #0

## 2020-12-30 NOTE — Progress Notes (Signed)
Post Operative Evaluation    Procedure/Date of Surgery: 12/12/20  Interval History:   Amilcar presents today as he feels that the plates and screws are shifted in his shoulder.  Does not know increasing pain.  He has been in a sling at all times per recommendation.   PMH/PSH/Family History/Social History/Meds/Allergies:    Past Medical History:  Diagnosis Date   ADHD (attention deficit hyperactivity disorder)    ADHD   Anemia    Anxiety    Benzodiazepine abuse (HCC)    Chronic tachycardia    Per Dr. Doreene Eland (psych)   Complication of anesthesia    was hard to put him under anesthesia   Depression    clinical depression   Drug use    GERD (gastroesophageal reflux disease)    Headache    History of IBS    Mood disorder (HCC)    Pneumonia    2017, lung collapse   Polysubstance abuse (HCC)    PTSD (post-traumatic stress disorder)    Past Surgical History:  Procedure Laterality Date   ADENOIDECTOMY     INNER EAR SURGERY     MULTIPLE TOOTH EXTRACTIONS     ORIF HUMERUS FRACTURE Left 12/12/2020   Procedure: OPEN REDUCTION INTERNAL FIXATION (ORIF) LEFT HUMERUS FRACTURE;  Surgeon: [name], MD;  Location: [facility] OR;  Service: Orthopedics;  Laterality: Left;  estimated operating time 2 hours.   WISDOM TOOTH EXTRACTION     Social History   Socioeconomic History   Marital status: Significant Other    Spouse name: Not on file   Number of children: Not on file   Years of education: Not on file   Highest education level: Not on file  Occupational History   Occupation: disabled  Tobacco Use   Smoking status: Every Day    Packs/day: 0.50    Types: Cigarettes   Smokeless tobacco: Never  Vaping Use   Vaping Use: Former  Substance and Sexual Activity   Alcohol use: Never   Drug use: Yes    Types: IV, Cocaine, Marijuana, Amphetamines, Hydrocodone, Oxycodone    Comment: no longer uses cocaine or marijuana   Sexual activity: Yes    Birth  control/protection: Condom  Other Topics Concern   Not on file  Social History Narrative   n/a   Social Determinants of Health   Financial Resource Strain: Not on file  Food Insecurity: Not on file  Transportation Needs: Not on file  Physical Activity: Not on file  Stress: Not on file  Social Connections: Not on file   No family history on file. Allergies  Allergen Reactions   Compazine [Prochlorperazine Edisylate] Other (See Comments)    Blood pressure drops   Haldol [Haloperidol]     Restless legs   Phenergan [Promethazine Hcl] Other (See Comments)    Blood pressure drops    Reglan [Metoclopramide]     Restless legs   Tramadol Other (See Comments)    Blood pressure drops    Current Outpatient Medications  Medication Sig Dispense Refill   albuterol (VENTOLIN HFA) 108 (90 Base) MCG/ACT inhaler Inhale 1-2 puffs into the lungs every 6 (six) hours as needed for wheezing or shortness of breath.     aspirin EC 325 MG tablet Take 1 tablet (325 mg total) by mouth daily. 30 tablet 0  buprenorphine-naloxone (SUBOXONE) 8-2 mg SUBL SL tablet Place 1-2 tablets under the tongue See admin instructions. 2 films in the morning 1 film in the evening     Fluvoxamine Maleate 100 MG CP24 Take 200 mg by mouth daily. (Patient not taking: No sig reported)     gabapentin (NEURONTIN) 600 MG tablet Take 600 mg by mouth 3 (three) times daily.     ibuprofen (ADVIL) 200 MG tablet Take 800 mg by mouth every 6 (six) hours as needed for headache or moderate pain.     LORazepam (ATIVAN) 1 MG tablet Take 1 mg by mouth 4 (four) times daily.     methocarbamol (ROBAXIN) 500 MG tablet Take 1 tablet (500 mg) by mouth twice daily. 30 tablet 2   naloxone (NARCAN) nasal spray 4 mg/0.1 mL Place into the nose. 2 each 0   naproxen (NAPROSYN) 500 MG tablet Take 500 mg by mouth 2 (two) times daily as needed for moderate pain.     naproxen sodium (ALEVE) 220 MG tablet Take 1 tablet (220 mg total) by mouth 2 (two) times  daily as needed. 30 tablet 3   ondansetron (ZOFRAN ODT) 4 MG disintegrating tablet Take 1 tablet (4 mg total) by mouth every 8 (eight) hours as needed for nausea or vomiting. 10 tablet 0   oxyCODONE (OXYCONTIN) 10 mg 12 hr tablet Take 1 tablet (10 mg total) by mouth every 12 (twelve) hours. 30 tablet 0   oxyCODONE (ROXICODONE) 15 MG immediate release tablet Take 1 tablet (15 mg total) by mouth every 4 (four) hours as needed for pain. 40 tablet 0   oxyCODONE-acetaminophen (PERCOCET) 5-325 MG tablet Take 1-2 tablets by mouth every 8 (eight) hours as needed for severe pain. 30 tablet 0   polyethylene glycol powder (GLYCOLAX/MIRALAX) 17 GM/SCOOP powder Take 17 g by mouth at bedtime.     temazepam (RESTORIL) 15 MG capsule Take 15 mg by mouth at bedtime.     triamcinolone (KENALOG) 0.025 % cream Apply 1 application topically 2 (two) times daily as needed (for rash).     No current facility-administered medications for this visit.   No results found.  Review of Systems:   A ROS was performed including pertinent positives and negatives as documented in the HPI.   Musculoskeletal Exam:    There were no vitals taken for this visit.  Left incision is clean dry and intact without erythema or drainage.  Able to extend at the left wrist and thumb.  Sensation is intact over the axillary distribution  Imaging:    2 views left humerus: There is settling of the humeral head and partial screw penetration as result.  The greater tuberosity has also migrated.  I personally reviewed and interpreted the radiographs.   Assessment:   40 year old male status post left proximal humerus open reduction internal fixation 3 weeks postop.  X-rays obtained today do not show any interval displacement more than the settling of the fracture that was observed a week prior. I again described that ideally if the fracture can heal in this position he may be a candidate for removal of hardware down the line.  We did discuss  the possibility of this happening given that his bone was essentially an eggshell at the time of surgery with regard to the humeral head.  With that being said I am very hesitant to perform reverse shoulder arthroplasty given his young age.  He is asking for additional muscle relaxers as the Robaxin is not working very  well for him  Plan :    -I will follow him closely and see him back in 2 weeks with x-rays -Soma ordered for muscle relaxation     I personally saw and evaluated the patient, and participated in the management and treatment plan.  [name], MD Attending Physician, Orthopedic Surgery  This document was dictated using Dragon voice recognition software. A reasonable attempt at proof reading has been made to minimize errors.

## 2020-12-30 NOTE — Progress Notes (Unsigned)
G

## 2021-01-09 ENCOUNTER — Other Ambulatory Visit (HOSPITAL_BASED_OUTPATIENT_CLINIC_OR_DEPARTMENT_OTHER): Payer: Self-pay | Admitting: Orthopaedic Surgery

## 2021-01-09 ENCOUNTER — Encounter (HOSPITAL_BASED_OUTPATIENT_CLINIC_OR_DEPARTMENT_OTHER): Payer: Medicaid Other | Admitting: Orthopaedic Surgery

## 2021-01-09 DIAGNOSIS — S42292A Other displaced fracture of upper end of left humerus, initial encounter for closed fracture: Secondary | ICD-10-CM

## 2021-01-10 ENCOUNTER — Other Ambulatory Visit: Payer: Self-pay

## 2021-01-10 ENCOUNTER — Ambulatory Visit (HOSPITAL_BASED_OUTPATIENT_CLINIC_OR_DEPARTMENT_OTHER): Payer: Medicaid Other | Admitting: Physical Therapy

## 2021-01-10 ENCOUNTER — Ambulatory Visit (HOSPITAL_BASED_OUTPATIENT_CLINIC_OR_DEPARTMENT_OTHER)
Admission: RE | Admit: 2021-01-10 | Discharge: 2021-01-10 | Disposition: A | Discharge status: Home / Self Care | Payer: Medicaid Other | Source: Ambulatory Visit | Attending: Orthopaedic Surgery | Admitting: Orthopaedic Surgery

## 2021-01-10 ENCOUNTER — Encounter (HOSPITAL_BASED_OUTPATIENT_CLINIC_OR_DEPARTMENT_OTHER): Payer: Medicaid Other | Admitting: Orthopaedic Surgery

## 2021-01-10 ENCOUNTER — Ambulatory Visit (INDEPENDENT_AMBULATORY_CARE_PROVIDER_SITE_OTHER): Payer: Medicaid Other | Admitting: Orthopaedic Surgery

## 2021-01-10 ENCOUNTER — Other Ambulatory Visit (HOSPITAL_COMMUNITY): Payer: Self-pay

## 2021-01-10 DIAGNOSIS — S42292A Other displaced fracture of upper end of left humerus, initial encounter for closed fracture: Secondary | ICD-10-CM

## 2021-01-10 MED ORDER — CARISOPRODOL 350 MG PO TABS
350.0000 mg | ORAL_TABLET | Freq: Four times a day (QID) | ORAL | 2 refills | Status: DC | PRN
  Filled 2021-01-10: qty 30, 8d supply, fill #0
  Filled 2021-01-30: qty 30, 8d supply, fill #1
  Filled 2021-02-14: qty 30, 8d supply, fill #2

## 2021-01-10 NOTE — Progress Notes (Signed)
Post Operative Evaluation    Procedure/Date of Surgery: 12/12/20  Interval History:   01/10/2021:   Presents today for follow-up of the left shoulder.  Overall states that the pain is much improved.  He has been working on gentle range of motion about the elbow and wrist.  He has abstained from physical therapy as I have asked in order to allow for interval healing of the shoulder.  PMH/PSH/Family History/Social History/Meds/Allergies:    Past Medical History:  Diagnosis Date   ADHD (attention deficit hyperactivity disorder)    ADHD   Anemia    Anxiety    Benzodiazepine abuse (HCC)    Chronic tachycardia    Per Dr. Doreene Eland (psych)   Complication of anesthesia    was hard to put him under anesthesia   Depression    clinical depression   Drug use    GERD (gastroesophageal reflux disease)    Headache    History of IBS    Mood disorder (HCC)    Pneumonia    2017, lung collapse   Polysubstance abuse (HCC)    PTSD (post-traumatic stress disorder)    Past Surgical History:  Procedure Laterality Date   ADENOIDECTOMY     INNER EAR SURGERY     MULTIPLE TOOTH EXTRACTIONS     ORIF HUMERUS FRACTURE Left 12/12/2020   Procedure: OPEN REDUCTION INTERNAL FIXATION (ORIF) LEFT HUMERUS FRACTURE;  Surgeon: [name], MD;  Location: [facility] OR;  Service: Orthopedics;  Laterality: Left;  estimated operating time 2 hours.   WISDOM TOOTH EXTRACTION     Social History   Socioeconomic History   Marital status: Significant Other    Spouse name: Not on file   Number of children: Not on file   Years of education: Not on file   Highest education level: Not on file  Occupational History   Occupation: disabled  Tobacco Use   Smoking status: Every Day    Packs/day: 0.50    Types: Cigarettes   Smokeless tobacco: Never  Vaping Use   Vaping Use: Former  Substance and Sexual Activity   Alcohol use: Never   Drug use: Yes    Types: IV, Cocaine,  Marijuana, Amphetamines, Hydrocodone, Oxycodone    Comment: no longer uses cocaine or marijuana   Sexual activity: Yes    Birth control/protection: Condom  Other Topics Concern   Not on file  Social History Narrative   n/a   Social Determinants of Health   Financial Resource Strain: Not on file  Food Insecurity: Not on file  Transportation Needs: Not on file  Physical Activity: Not on file  Stress: Not on file  Social Connections: Not on file   No family history on file. Allergies  Allergen Reactions   Compazine [Prochlorperazine Edisylate] Other (See Comments)    Blood pressure drops   Haldol [Haloperidol]     Restless legs   Phenergan [Promethazine Hcl] Other (See Comments)    Blood pressure drops    Reglan [Metoclopramide]     Restless legs   Tramadol Other (See Comments)    Blood pressure drops    Current Outpatient Medications  Medication Sig Dispense Refill   albuterol (VENTOLIN HFA) 108 (90 Base) MCG/ACT inhaler Inhale 1-2 puffs into the lungs every 6 (six) hours as needed for wheezing or shortness of breath.  aspirin EC 325 MG tablet Take 1 tablet (325 mg total) by mouth daily. 30 tablet 0   buprenorphine-naloxone (SUBOXONE) 8-2 mg SUBL SL tablet Place 1-2 tablets under the tongue See admin instructions. 2 films in the morning 1 film in the evening     carisoprodol (SOMA) 350 MG tablet Take 1 tablet (350 mg total) by mouth 4 (four) times daily as needed for muscle spasms. 30 tablet 2   Fluvoxamine Maleate 100 MG CP24 Take 200 mg by mouth daily. (Patient not taking: No sig reported)     gabapentin (NEURONTIN) 600 MG tablet Take 600 mg by mouth 3 (three) times daily.     ibuprofen (ADVIL) 200 MG tablet Take 800 mg by mouth every 6 (six) hours as needed for headache or moderate pain.     LORazepam (ATIVAN) 1 MG tablet Take 1 mg by mouth 4 (four) times daily.     methocarbamol (ROBAXIN) 500 MG tablet Take 1 tablet (500 mg) by mouth twice daily. 30 tablet 2    naloxone (NARCAN) nasal spray 4 mg/0.1 mL Place into the nose. 2 each 0   naproxen (NAPROSYN) 500 MG tablet Take 500 mg by mouth 2 (two) times daily as needed for moderate pain.     ondansetron (ZOFRAN ODT) 4 MG disintegrating tablet Take 1 tablet (4 mg total) by mouth every 8 (eight) hours as needed for nausea or vomiting. 10 tablet 0   oxyCODONE (OXYCONTIN) 10 mg 12 hr tablet Take 1 tablet (10 mg total) by mouth every 12 (twelve) hours. 30 tablet 0   oxyCODONE (ROXICODONE) 15 MG immediate release tablet Take 1 tablet (15 mg total) by mouth every 4 (four) hours as needed for pain. 40 tablet 0   oxyCODONE-acetaminophen (PERCOCET) 5-325 MG tablet Take 1-2 tablets by mouth every 8 (eight) hours as needed for severe pain. 30 tablet 0   polyethylene glycol powder (GLYCOLAX/MIRALAX) 17 GM/SCOOP powder Take 17 g by mouth at bedtime.     temazepam (RESTORIL) 15 MG capsule Take 15 mg by mouth at bedtime.     triamcinolone (KENALOG) 0.025 % cream Apply 1 application topically 2 (two) times daily as needed (for rash).     No current facility-administered medications for this visit.   No results found.  Review of Systems:   A ROS was performed including pertinent positives and negatives as documented in the HPI.   Musculoskeletal Exam:    There were no vitals taken for this visit.  Left incision is clean dry and intact without erythema or drainage.  Able to extend at the left wrist and thumb.  Sensation is intact over the axillary distribution  Imaging:    2 views left humerus: There is settling of the humeral head and partial screw penetration as result.  This is overall mild.  There is overall healing of the proximal humerus. I personally reviewed and interpreted the radiographs.   Assessment:   40 year old male status post left proximal humerus open reduction internal fixation.  There is interval healing of the proximal humerus.  I described that at this time I would like him to work with  physical therapy on gentle passive range of motion with a goal of 90 degrees of flexion.  I will see him back in 4 weeks and at that time we will plan to obtain a CT scan so that we can look for enough healing so that we can perform an elective removal of hardware so as to remove screws that I  penetrated through the humeral head.  Plan :    -Return to clinic in 4 weeks -So I reordered    I personally saw and evaluated the patient, and participated in the management and treatment plan.  [name], MD Attending Physician, Orthopedic Surgery  This document was dictated using Dragon voice recognition software. A reasonable attempt at proof reading has been made to minimize errors.

## 2021-01-27 ENCOUNTER — Other Ambulatory Visit (HOSPITAL_COMMUNITY): Payer: Self-pay

## 2021-01-27 ENCOUNTER — Telehealth: Payer: Self-pay | Admitting: Orthopaedic Surgery

## 2021-01-27 ENCOUNTER — Ambulatory Visit (HOSPITAL_BASED_OUTPATIENT_CLINIC_OR_DEPARTMENT_OTHER): Payer: Medicaid Other | Attending: Orthopaedic Surgery | Admitting: Physical Therapy

## 2021-01-27 NOTE — Telephone Encounter (Signed)
Pt's mother  called requesting a refill of oxycodone. Pt states he is in sever pains and need something for pain. Please send to pharmacy on file. Phone number is [phone].

## 2021-01-27 NOTE — Telephone Encounter (Signed)
Patient is requesting a refill of the pain medication prescribed to him. He has an appt on Monday 12/12.

## 2021-01-30 ENCOUNTER — Ambulatory Visit (HOSPITAL_BASED_OUTPATIENT_CLINIC_OR_DEPARTMENT_OTHER)
Admission: RE | Admit: 2021-01-30 | Discharge: 2021-01-30 | Disposition: A | Discharge status: Home / Self Care | Payer: Medicaid Other | Source: Ambulatory Visit | Attending: Orthopaedic Surgery | Admitting: Orthopaedic Surgery

## 2021-01-30 ENCOUNTER — Other Ambulatory Visit: Payer: Self-pay

## 2021-01-30 ENCOUNTER — Other Ambulatory Visit (HOSPITAL_COMMUNITY): Payer: Self-pay

## 2021-01-30 ENCOUNTER — Encounter (HOSPITAL_BASED_OUTPATIENT_CLINIC_OR_DEPARTMENT_OTHER): Payer: Self-pay | Admitting: Radiology

## 2021-01-30 ENCOUNTER — Ambulatory Visit (INDEPENDENT_AMBULATORY_CARE_PROVIDER_SITE_OTHER): Payer: Medicaid Other | Admitting: Orthopaedic Surgery

## 2021-01-30 ENCOUNTER — Other Ambulatory Visit (HOSPITAL_BASED_OUTPATIENT_CLINIC_OR_DEPARTMENT_OTHER): Payer: Self-pay | Admitting: Orthopaedic Surgery

## 2021-01-30 DIAGNOSIS — S42292A Other displaced fracture of upper end of left humerus, initial encounter for closed fracture: Secondary | ICD-10-CM | POA: Diagnosis present

## 2021-01-30 DIAGNOSIS — S42292S Other displaced fracture of upper end of left humerus, sequela: Secondary | ICD-10-CM | POA: Diagnosis present

## 2021-01-30 MED ORDER — CARISOPRODOL 350 MG PO TABS
350.0000 mg | ORAL_TABLET | Freq: Four times a day (QID) | ORAL | 0 refills | Status: DC | PRN
  Filled 2021-01-30 – 2021-02-06 (×2): qty 30, 8d supply, fill #0

## 2021-01-30 NOTE — Progress Notes (Signed)
Post Operative Evaluation    Procedure/Date of Surgery: 12/12/20 left proximal humerus open reduction internal fixation  Interval History:   01/30/2021: Presents today status post left proximal humerus open reduction internal fixation.  He continues to have very limited ability to abduct or move the arm.  In a sling most of the time.  He states that he experiences spasms in the left arm.  Presents today for follow-up of the left shoulder.  Overall states that the pain is much improved.  He has been working on gentle range of motion about the elbow and wrist.  He has abstained from physical therapy as I have asked in order to allow for interval healing of the shoulder.  PMH/PSH/Family History/Social History/Meds/Allergies:    Past Medical History:  Diagnosis Date   ADHD (attention deficit hyperactivity disorder)    ADHD   Anemia    Anxiety    Benzodiazepine abuse (HCC)    Chronic tachycardia    Per Dr. Doreene Eland (psych)   Complication of anesthesia    was hard to put him under anesthesia   Depression    clinical depression   Drug use    GERD (gastroesophageal reflux disease)    Headache    History of IBS    Mood disorder (HCC)    Pneumonia    2017, lung collapse   Polysubstance abuse (HCC)    PTSD (post-traumatic stress disorder)    Past Surgical History:  Procedure Laterality Date   ADENOIDECTOMY     INNER EAR SURGERY     MULTIPLE TOOTH EXTRACTIONS     ORIF HUMERUS FRACTURE Left 12/12/2020   Procedure: OPEN REDUCTION INTERNAL FIXATION (ORIF) LEFT HUMERUS FRACTURE;  Surgeon: [name], MD;  Location: [facility] OR;  Service: Orthopedics;  Laterality: Left;  estimated operating time 2 hours.   WISDOM TOOTH EXTRACTION     Social History   Socioeconomic History   Marital status: Single    Spouse name: Not on file   Number of children: Not on file   Years of education: Not on file   Highest education level: Not on file  Occupational  History   Occupation: disabled  Tobacco Use   Smoking status: Every Day    Packs/day: 0.50    Types: Cigarettes   Smokeless tobacco: Never  Vaping Use   Vaping Use: Former  Substance and Sexual Activity   Alcohol use: Never   Drug use: Yes    Types: IV, Cocaine, Marijuana, Amphetamines, Hydrocodone, Oxycodone    Comment: no longer uses cocaine or marijuana   Sexual activity: Yes    Birth control/protection: Condom  Other Topics Concern   Not on file  Social History Narrative   n/a   Social Determinants of Health   Financial Resource Strain: Not on file  Food Insecurity: Not on file  Transportation Needs: Not on file  Physical Activity: Not on file  Stress: Not on file  Social Connections: Not on file   No family history on file. Allergies  Allergen Reactions   Compazine [Prochlorperazine Edisylate] Other (See Comments)    Blood pressure drops   Haldol [Haloperidol]     Restless legs   Phenergan [Promethazine Hcl] Other (See Comments)    Blood pressure drops    Reglan [Metoclopramide]     Restless legs  Tramadol Other (See Comments)    Blood pressure drops    Current Outpatient Medications  Medication Sig Dispense Refill   albuterol (VENTOLIN HFA) 108 (90 Base) MCG/ACT inhaler Inhale 1-2 puffs into the lungs every 6 (six) hours as needed for wheezing or shortness of breath.     aspirin EC 325 MG tablet Take 1 tablet (325 mg total) by mouth daily. 30 tablet 0   buprenorphine-naloxone (SUBOXONE) 8-2 mg SUBL SL tablet Place 1-2 tablets under the tongue See admin instructions. 2 films in the morning 1 film in the evening     carisoprodol (SOMA) 350 MG tablet Take 1 tablet (350 mg total) by mouth 4 (four) times daily as needed for muscle spasms. 30 tablet 2   Fluvoxamine Maleate 100 MG CP24 Take 200 mg by mouth daily. (Patient not taking: No sig reported)     gabapentin (NEURONTIN) 600 MG tablet Take 600 mg by mouth 3 (three) times daily.     ibuprofen (ADVIL) 200 MG  tablet Take 800 mg by mouth every 6 (six) hours as needed for headache or moderate pain.     LORazepam (ATIVAN) 1 MG tablet Take 1 mg by mouth 4 (four) times daily.     methocarbamol (ROBAXIN) 500 MG tablet Take 1 tablet (500 mg) by mouth twice daily. 30 tablet 2   naloxone (NARCAN) nasal spray 4 mg/0.1 mL Place into the nose. 2 each 0   naproxen (NAPROSYN) 500 MG tablet Take 500 mg by mouth 2 (two) times daily as needed for moderate pain.     ondansetron (ZOFRAN ODT) 4 MG disintegrating tablet Take 1 tablet (4 mg total) by mouth every 8 (eight) hours as needed for nausea or vomiting. 10 tablet 0   oxyCODONE (OXYCONTIN) 10 mg 12 hr tablet Take 1 tablet (10 mg total) by mouth every 12 (twelve) hours. 30 tablet 0   oxyCODONE (ROXICODONE) 15 MG immediate release tablet Take 1 tablet (15 mg total) by mouth every 4 (four) hours as needed for pain. 40 tablet 0   oxyCODONE-acetaminophen (PERCOCET) 5-325 MG tablet Take 1-2 tablets by mouth every 8 (eight) hours as needed for severe pain. 30 tablet 0   polyethylene glycol powder (GLYCOLAX/MIRALAX) 17 GM/SCOOP powder Take 17 g by mouth at bedtime.     temazepam (RESTORIL) 15 MG capsule Take 15 mg by mouth at bedtime.     triamcinolone (KENALOG) 0.025 % cream Apply 1 application topically 2 (two) times daily as needed (for rash).     No current facility-administered medications for this visit.   No results found.  Review of Systems:   A ROS was performed including pertinent positives and negatives as documented in the HPI.   Musculoskeletal Exam:    There were no vitals taken for this visit.  Left incision is clean dry and intact without erythema or drainage.  Able to extend at the left wrist and thumb.  Sensation is intact over the axillary distribution  Imaging:    2 views left humerus: There is settling of the humeral head and partial screw penetration as result.  This is overall mild.  There is overall healing of the proximal humerus. I  personally reviewed and interpreted the radiographs.   Assessment:   40 year old male status post left proximal humerus open reduction internal fixation.  There is interval healing of the proximal humerus.  At this point I believe that there is enough healing that I would like to obtain a CT scan of the left  shoulder to see if there is enough healing so that we can take out the hardware.  At that time I would plan to actively start ranging range of motion as tolerated.  Plan :    -Plan for CT scan left shoulder -Plan for left shoulder removal of hardware if there is enough healing about the fracture site   After a lengthy discussion of treatment options, including risks, benefits, alternatives, complications of surgical and nonsurgical conservative options, the patient elected surgical repair.   The patient  is aware of the material risks  and complications including, but not limited to injury to adjacent structures, neurovascular injury, infection, numbness, bleeding, implant failure, thermal burns, stiffness, persistent pain, failure to heal, disease transmission from allograft, need for further surgery, dislocation, anesthetic risks, blood clots, risks of death,and others. The probabilities of surgical success and failure discussed with patient given their particular co-morbidities.The time and nature of expected rehabilitation and recovery was discussed.The patient's questions were all answered preoperatively.  No barriers to understanding were noted. I explained the natural history of the disease process and Rx rationale.  I explained to the patient what I considered to be reasonable expectations given their personal situation.  The final treatment plan was arrived at through a shared patient decision making process model.     I personally saw and evaluated the patient, and participated in the management and treatment plan.  [name], MD Attending Physician, Orthopedic  Surgery  This document was dictated using Dragon voice recognition software. A reasonable attempt at proof reading has been made to minimize errors.

## 2021-01-30 NOTE — Addendum Note (Signed)
Addended by: Benancio Deeds on: 01/30/2021 11:05 AM   Modules accepted: Orders

## 2021-02-03 ENCOUNTER — Encounter (HOSPITAL_BASED_OUTPATIENT_CLINIC_OR_DEPARTMENT_OTHER): Payer: Medicaid Other | Admitting: Orthopaedic Surgery

## 2021-02-06 ENCOUNTER — Other Ambulatory Visit (HOSPITAL_COMMUNITY): Payer: Self-pay

## 2021-02-14 ENCOUNTER — Other Ambulatory Visit (HOSPITAL_COMMUNITY): Payer: Self-pay

## 2021-02-17 ENCOUNTER — Other Ambulatory Visit: Payer: Self-pay

## 2021-02-17 ENCOUNTER — Emergency Department (HOSPITAL_COMMUNITY)
Admission: EM | Admit: 2021-02-17 | Discharge: 2021-02-18 | Disposition: A | Discharge status: Home / Self Care | Payer: Medicaid Other | Attending: Physician Assistant | Admitting: Physician Assistant

## 2021-02-17 DIAGNOSIS — M7989 Other specified soft tissue disorders: Secondary | ICD-10-CM | POA: Insufficient documentation

## 2021-02-17 DIAGNOSIS — Z5321 Procedure and treatment not carried out due to patient leaving prior to being seen by health care provider: Secondary | ICD-10-CM | POA: Diagnosis not present

## 2021-02-18 ENCOUNTER — Other Ambulatory Visit: Payer: Self-pay

## 2021-02-18 ENCOUNTER — Emergency Department (HOSPITAL_COMMUNITY): Payer: Medicaid Other

## 2021-02-18 LAB — CBC WITH DIFFERENTIAL/PLATELET
Abs Immature Granulocytes: 0.01 10*3/uL (ref 0.00–0.07)
Basophils Absolute: 0 10*3/uL (ref 0.0–0.1)
Basophils Relative: 1 %
Eosinophils Absolute: 0.1 10*3/uL (ref 0.0–0.5)
Eosinophils Relative: 2 %
HCT: 39.4 % (ref 39.0–52.0)
Hemoglobin: 11.9 g/dL — ABNORMAL LOW (ref 13.0–17.0)
Immature Granulocytes: 0 %
Lymphocytes Relative: 33 %
Lymphs Abs: 2 10*3/uL (ref 0.7–4.0)
MCH: 28.6 pg (ref 26.0–34.0)
MCHC: 30.2 g/dL (ref 30.0–36.0)
MCV: 94.7 fL (ref 80.0–100.0)
Monocytes Absolute: 0.6 10*3/uL (ref 0.1–1.0)
Monocytes Relative: 10 %
Neutro Abs: 3.4 10*3/uL (ref 1.7–7.7)
Neutrophils Relative %: 54 %
Platelets: 261 10*3/uL (ref 150–400)
RBC: 4.16 MIL/uL — ABNORMAL LOW (ref 4.22–5.81)
RDW: 13.5 % (ref 11.5–15.5)
WBC: 6.1 10*3/uL (ref 4.0–10.5)
nRBC: 0 % (ref 0.0–0.2)

## 2021-02-18 LAB — COMPREHENSIVE METABOLIC PANEL
ALT: 13 U/L (ref 0–44)
AST: 16 U/L (ref 15–41)
Albumin: 4.1 g/dL (ref 3.5–5.0)
Alkaline Phosphatase: 173 U/L — ABNORMAL HIGH (ref 38–126)
Anion gap: 8 (ref 5–15)
BUN: 8 mg/dL (ref 6–20)
CO2: 30 mmol/L (ref 22–32)
Calcium: 9 mg/dL (ref 8.9–10.3)
Chloride: 101 mmol/L (ref 98–111)
Creatinine, Ser: 0.8 mg/dL (ref 0.61–1.24)
GFR, Estimated: >60 mL/min (ref >60)
Glucose, Bld: 94 mg/dL (ref 70–99)
Potassium: 3.6 mmol/L (ref 3.5–5.1)
Sodium: 139 mmol/L (ref 135–145)
Total Bilirubin: 0.4 mg/dL (ref 0.3–1.2)
Total Protein: 7.4 g/dL (ref 6.5–8.1)

## 2021-02-18 LAB — BRAIN NATRIURETIC PEPTIDE: B Natriuretic Peptide: 34.1 pg/mL (ref 0.0–100.0)

## 2021-02-18 NOTE — ED Provider Notes (Signed)
Emergency Medicine Provider Triage Evaluation Note  [name] , a 40 y.o. male  was evaluated in triage.  Pt complains of leg swelling. Sxs ongoing for 1 month. Took lasix today.  Review of Systems  Positive: Leg swelling Negative: Chest pain, sob  Physical Exam  BP 136/79    Pulse 70    Temp 98.5 F (36.9 C) (Oral)    Resp 16    SpO2 96%  Gen:   Awake, no distress   Resp:  Normal effort  MSK:   Moves extremities without difficulty  Other:  Trace ble edema  Medical Decision Making  Medically screening exam initiated at 12:39 AM.  Appropriate orders placed.  [name] was informed that the remainder of the evaluation will be completed by another provider, this initial triage assessment does not replace that evaluation, and the importance of remaining in the ED until their evaluation is complete.     Karrie Meres, PA-C 02/18/21 0041    Geoffery Lyons, MD 02/18/21 [phone]

## 2021-02-18 NOTE — ED Notes (Signed)
Pt decided that he did not want to wait to be seen.

## 2021-02-18 NOTE — ED Triage Notes (Addendum)
Pt c/o bilateral leg swelling, worse on right side. Swelling ongoing for a 3 months, takes lasix at home. States worsening leg swelling today. Denies increase in weight. Denies CP/SOB. States taking lasix today, was taking PRN. Denies HX CHF

## 2021-02-21 ENCOUNTER — Other Ambulatory Visit (HOSPITAL_BASED_OUTPATIENT_CLINIC_OR_DEPARTMENT_OTHER): Payer: Self-pay | Admitting: Orthopaedic Surgery

## 2021-02-21 ENCOUNTER — Telehealth: Payer: Self-pay | Admitting: Orthopaedic Surgery

## 2021-02-21 ENCOUNTER — Other Ambulatory Visit (HOSPITAL_BASED_OUTPATIENT_CLINIC_OR_DEPARTMENT_OTHER): Payer: Self-pay

## 2021-02-21 ENCOUNTER — Telehealth (HOSPITAL_BASED_OUTPATIENT_CLINIC_OR_DEPARTMENT_OTHER): Payer: Self-pay | Admitting: Orthopaedic Surgery

## 2021-02-21 MED ORDER — OXYCODONE-ACETAMINOPHEN 10-325 MG PO TABS: 1.0000 | ORAL_TABLET | ORAL | 0 refills | Status: DC | PRN

## 2021-02-21 NOTE — Telephone Encounter (Signed)
Pt requesting Rx

## 2021-02-21 NOTE — Telephone Encounter (Signed)
Patient called asked if he can get something for pain called into the pharmacy at the [facility].  The number to contact  patient is   [phone]

## 2021-02-22 ENCOUNTER — Encounter (HOSPITAL_BASED_OUTPATIENT_CLINIC_OR_DEPARTMENT_OTHER): Payer: Self-pay | Admitting: Orthopaedic Surgery

## 2021-02-22 ENCOUNTER — Other Ambulatory Visit: Payer: Self-pay

## 2021-02-27 ENCOUNTER — Other Ambulatory Visit (HOSPITAL_COMMUNITY): Payer: Self-pay

## 2021-02-27 ENCOUNTER — Other Ambulatory Visit (HOSPITAL_BASED_OUTPATIENT_CLINIC_OR_DEPARTMENT_OTHER): Payer: Self-pay | Admitting: Orthopaedic Surgery

## 2021-02-27 MED ORDER — CARISOPRODOL 350 MG PO TABS
350.0000 mg | ORAL_TABLET | Freq: Four times a day (QID) | ORAL | 2 refills | Status: DC | PRN
Start: 2021-02-27 — End: 2021-06-30
  Filled 2021-02-27: qty 30, 8d supply, fill #0
  Filled 2021-03-07: qty 30, 8d supply, fill #1
  Filled 2021-03-16: qty 30, 8d supply, fill #2

## 2021-03-01 NOTE — Anesthesia Preprocedure Evaluation (Addendum)
Anesthesia Evaluation  Patient identified by MRN, date of birth, ID band Patient awake    Reviewed: Allergy & Precautions, NPO status , Patient's Chart, lab work & pertinent test results  Airway Mallampati: I  TM Distance: >3 FB Neck ROM: Full    Dental no notable dental hx. (+) Edentulous Upper, Edentulous Lower   Pulmonary Current Smoker,    Pulmonary exam normal breath sounds clear to auscultation       Cardiovascular Exercise Tolerance: Good Normal cardiovascular exam Rhythm:Regular Rate:Normal     Neuro/Psych  Headaches, Anxiety Depression    GI/Hepatic GERD  ,(+)     substance abuse  cocaine use and marijuana use, On Suboxone   Endo/Other  negative endocrine ROS  Renal/GU      Musculoskeletal Narcotic dependednt   Abdominal   Peds  Hematology Lab Results      Component                Value               Date                      WBC                      6.1                 02/18/2021                HGB                      11.9 (L)            02/18/2021                HCT                      39.4                02/18/2021                MCV                      94.7                02/18/2021                PLT                      261                 02/18/2021              Anesthesia Other Findings   Reproductive/Obstetrics                            Anesthesia Physical Anesthesia Plan  ASA: 2  Anesthesia Plan: Regional and General   Post-op Pain Management: Regional block, Minimal or no pain anticipated and Tylenol PO (pre-op)   Induction: Intravenous  PONV Risk Score and Plan: 2 and Treatment may vary due to age or medical condition, Midazolam and Ondansetron  Airway Management Planned: Oral ETT  Additional Equipment: None  Intra-op Plan:   Post-operative Plan: Extubation in OR  Informed Consent: I have reviewed the patients History and Physical, chart, labs and  discussed the procedure including the risks, benefits and alternatives  for the proposed anesthesia with the patient or authorized representative who has indicated his/her understanding and acceptance.     Dental advisory given  Plan Discussed with: CRNA and Anesthesiologist  Anesthesia Plan Comments: (GA w L ISB w exparel)       Anesthesia Quick Evaluation

## 2021-03-02 ENCOUNTER — Other Ambulatory Visit (HOSPITAL_BASED_OUTPATIENT_CLINIC_OR_DEPARTMENT_OTHER): Payer: Self-pay | Admitting: Orthopaedic Surgery

## 2021-03-02 ENCOUNTER — Ambulatory Visit (HOSPITAL_BASED_OUTPATIENT_CLINIC_OR_DEPARTMENT_OTHER): Payer: Self-pay | Admitting: Orthopaedic Surgery

## 2021-03-02 ENCOUNTER — Encounter (HOSPITAL_BASED_OUTPATIENT_CLINIC_OR_DEPARTMENT_OTHER)
Admission: RE | Disposition: A | Discharge status: Home / Self Care | Payer: Self-pay | Source: Home / Self Care | Attending: Orthopaedic Surgery

## 2021-03-02 ENCOUNTER — Other Ambulatory Visit: Payer: Self-pay

## 2021-03-02 ENCOUNTER — Encounter (HOSPITAL_BASED_OUTPATIENT_CLINIC_OR_DEPARTMENT_OTHER): Payer: Self-pay | Admitting: Orthopaedic Surgery

## 2021-03-02 ENCOUNTER — Ambulatory Visit (HOSPITAL_BASED_OUTPATIENT_CLINIC_OR_DEPARTMENT_OTHER): Payer: Medicaid Other | Admitting: Certified Registered"

## 2021-03-02 ENCOUNTER — Ambulatory Visit (HOSPITAL_COMMUNITY): Payer: Medicaid Other

## 2021-03-02 ENCOUNTER — Ambulatory Visit (HOSPITAL_BASED_OUTPATIENT_CLINIC_OR_DEPARTMENT_OTHER)
Admission: RE | Admit: 2021-03-02 | Discharge: 2021-03-02 | Disposition: A | Discharge status: Home / Self Care | Payer: Medicaid Other | Attending: Orthopaedic Surgery | Admitting: Orthopaedic Surgery

## 2021-03-02 DIAGNOSIS — S42292A Other displaced fracture of upper end of left humerus, initial encounter for closed fracture: Secondary | ICD-10-CM

## 2021-03-02 DIAGNOSIS — F17210 Nicotine dependence, cigarettes, uncomplicated: Secondary | ICD-10-CM | POA: Diagnosis not present

## 2021-03-02 DIAGNOSIS — K219 Gastro-esophageal reflux disease without esophagitis: Secondary | ICD-10-CM | POA: Insufficient documentation

## 2021-03-02 DIAGNOSIS — Z419 Encounter for procedure for purposes other than remedying health state, unspecified: Secondary | ICD-10-CM

## 2021-03-02 DIAGNOSIS — F419 Anxiety disorder, unspecified: Secondary | ICD-10-CM | POA: Insufficient documentation

## 2021-03-02 DIAGNOSIS — S42292S Other displaced fracture of upper end of left humerus, sequela: Secondary | ICD-10-CM

## 2021-03-02 DIAGNOSIS — F32A Depression, unspecified: Secondary | ICD-10-CM | POA: Insufficient documentation

## 2021-03-02 DIAGNOSIS — T8484XA Pain due to internal orthopedic prosthetic devices, implants and grafts, initial encounter: Secondary | ICD-10-CM

## 2021-03-02 DIAGNOSIS — M24612 Ankylosis, left shoulder: Secondary | ICD-10-CM

## 2021-03-02 DIAGNOSIS — Y798 Miscellaneous orthopedic devices associated with adverse incidents, not elsewhere classified: Secondary | ICD-10-CM | POA: Diagnosis not present

## 2021-03-02 HISTORY — PX: HARDWARE REMOVAL: SHX979

## 2021-03-02 SURGERY — REMOVAL, HARDWARE
Anesthesia: Regional | Laterality: Left

## 2021-03-02 MED ORDER — MIDAZOLAM HCL 2 MG/2ML IJ SOLN
INTRAMUSCULAR | Status: AC
  Filled 2021-03-02: qty 2

## 2021-03-02 MED ORDER — 0.9 % SODIUM CHLORIDE (POUR BTL) OPTIME
TOPICAL | Status: DC | PRN
  Administered 2021-03-02: 500 mL

## 2021-03-02 MED ORDER — TRANEXAMIC ACID-NACL 1000-0.7 MG/100ML-% IV SOLN
INTRAVENOUS | Status: AC
  Filled 2021-03-02: qty 100

## 2021-03-02 MED ORDER — OXYCODONE HCL 5 MG PO TABS
5.0000 mg | ORAL_TABLET | Freq: Once | ORAL | Status: AC | PRN
  Administered 2021-03-02: 5 mg via ORAL

## 2021-03-02 MED ORDER — SUGAMMADEX SODIUM 200 MG/2ML IV SOLN
INTRAVENOUS | Status: DC | PRN
  Administered 2021-03-02: 200 mg via INTRAVENOUS

## 2021-03-02 MED ORDER — TRANEXAMIC ACID-NACL 1000-0.7 MG/100ML-% IV SOLN
1000.0000 mg | INTRAVENOUS | Status: AC
  Administered 2021-03-02: 1000 mg via INTRAVENOUS

## 2021-03-02 MED ORDER — PROPOFOL 10 MG/ML IV BOLUS
INTRAVENOUS | Status: AC
  Filled 2021-03-02: qty 20

## 2021-03-02 MED ORDER — LIDOCAINE 2% (20 MG/ML) 5 ML SYRINGE
INTRAMUSCULAR | Status: AC
  Filled 2021-03-02: qty 5

## 2021-03-02 MED ORDER — OXYCODONE-ACETAMINOPHEN 10-325 MG PO TABS
1.0000 | ORAL_TABLET | ORAL | 0 refills | Status: DC | PRN
Start: 2021-03-02 — End: 2021-06-30

## 2021-03-02 MED ORDER — PHENYLEPHRINE HCL (PRESSORS) 10 MG/ML IV SOLN
INTRAVENOUS | Status: DC | PRN
  Administered 2021-03-02 (×2): 80 ug via INTRAVENOUS

## 2021-03-02 MED ORDER — MIDAZOLAM HCL 2 MG/2ML IJ SOLN
2.0000 mg | Freq: Once | INTRAMUSCULAR | Status: AC
  Administered 2021-03-02: 2 mg via INTRAVENOUS

## 2021-03-02 MED ORDER — GABAPENTIN 300 MG PO CAPS
300.0000 mg | ORAL_CAPSULE | Freq: Once | ORAL | Status: AC
  Administered 2021-03-02: 300 mg via ORAL

## 2021-03-02 MED ORDER — DEXAMETHASONE SODIUM PHOSPHATE 10 MG/ML IJ SOLN
INTRAMUSCULAR | Status: AC
  Filled 2021-03-02: qty 1

## 2021-03-02 MED ORDER — ONDANSETRON HCL 4 MG/2ML IJ SOLN
INTRAMUSCULAR | Status: AC
  Filled 2021-03-02: qty 4

## 2021-03-02 MED ORDER — ACETAMINOPHEN 500 MG PO TABS
1000.0000 mg | ORAL_TABLET | Freq: Once | ORAL | Status: AC
  Administered 2021-03-02: 1000 mg via ORAL

## 2021-03-02 MED ORDER — HYDROMORPHONE HCL 1 MG/ML IJ SOLN
0.2500 mg | INTRAMUSCULAR | Status: DC | PRN
  Administered 2021-03-02: 0.5 mg via INTRAVENOUS

## 2021-03-02 MED ORDER — OXYCODONE HCL 5 MG/5ML PO SOLN: 5.0000 mg | Freq: Once | ORAL | Status: AC | PRN

## 2021-03-02 MED ORDER — VANCOMYCIN HCL 1000 MG IV SOLR
INTRAVENOUS | Status: DC | PRN
  Administered 2021-03-02: 1000 mg

## 2021-03-02 MED ORDER — EPHEDRINE SULFATE 50 MG/ML IJ SOLN
INTRAMUSCULAR | Status: DC | PRN
  Administered 2021-03-02 (×2): 10 mg via INTRAVENOUS

## 2021-03-02 MED ORDER — FENTANYL CITRATE (PF) 100 MCG/2ML IJ SOLN
INTRAMUSCULAR | Status: DC | PRN
Start: 2021-03-02 — End: 2021-03-02
  Administered 2021-03-02: 100 ug via INTRAVENOUS

## 2021-03-02 MED ORDER — OXYCODONE HCL 5 MG PO TABS
ORAL_TABLET | ORAL | Status: AC
  Filled 2021-03-02: qty 1

## 2021-03-02 MED ORDER — ONDANSETRON HCL 4 MG/2ML IJ SOLN
INTRAMUSCULAR | Status: AC
  Filled 2021-03-02: qty 2

## 2021-03-02 MED ORDER — BUPIVACAINE HCL (PF) 0.5 % IJ SOLN
INTRAMUSCULAR | Status: DC | PRN
  Administered 2021-03-02: 20 mL via PERINEURAL

## 2021-03-02 MED ORDER — BUPIVACAINE LIPOSOME 1.3 % IJ SUSP
INTRAMUSCULAR | Status: DC | PRN
  Administered 2021-03-02: 10 mL via PERINEURAL

## 2021-03-02 MED ORDER — HYDROMORPHONE HCL 1 MG/ML IJ SOLN
INTRAMUSCULAR | Status: AC
  Filled 2021-03-02: qty 0.5

## 2021-03-02 MED ORDER — ONDANSETRON HCL 4 MG/2ML IJ SOLN: 4.0000 mg | Freq: Once | INTRAMUSCULAR | Status: DC | PRN

## 2021-03-02 MED ORDER — ACETAMINOPHEN 500 MG PO TABS
ORAL_TABLET | ORAL | Status: AC
  Filled 2021-03-02: qty 2

## 2021-03-02 MED ORDER — FENTANYL CITRATE (PF) 100 MCG/2ML IJ SOLN
INTRAMUSCULAR | Status: AC
  Filled 2021-03-02: qty 2

## 2021-03-02 MED ORDER — ACETAMINOPHEN 10 MG/ML IV SOLN: 1000.0000 mg | Freq: Once | INTRAVENOUS | Status: DC | PRN

## 2021-03-02 MED ORDER — ONDANSETRON HCL 4 MG/2ML IJ SOLN
INTRAMUSCULAR | Status: DC | PRN
  Administered 2021-03-02: 4 mg via INTRAVENOUS

## 2021-03-02 MED ORDER — PROPOFOL 10 MG/ML IV BOLUS
INTRAVENOUS | Status: DC | PRN
Start: 2021-03-02 — End: 2021-03-02
  Administered 2021-03-02: 170 mg via INTRAVENOUS

## 2021-03-02 MED ORDER — DEXMEDETOMIDINE (PRECEDEX) IN NS 20 MCG/5ML (4 MCG/ML) IV SYRINGE
PREFILLED_SYRINGE | INTRAVENOUS | Status: DC | PRN
  Administered 2021-03-02: 20 ug via INTRAVENOUS

## 2021-03-02 MED ORDER — AMISULPRIDE (ANTIEMETIC) 5 MG/2ML IV SOLN: 10.0000 mg | Freq: Once | INTRAVENOUS | Status: DC | PRN

## 2021-03-02 MED ORDER — FENTANYL CITRATE (PF) 100 MCG/2ML IJ SOLN
100.0000 ug | Freq: Once | INTRAMUSCULAR | Status: AC
  Administered 2021-03-02: 100 ug via INTRAVENOUS

## 2021-03-02 MED ORDER — LACTATED RINGERS IV SOLN: INTRAVENOUS | Status: DC

## 2021-03-02 MED ORDER — DEXAMETHASONE SODIUM PHOSPHATE 10 MG/ML IJ SOLN
INTRAMUSCULAR | Status: DC | PRN
  Administered 2021-03-02: 10 mg via INTRAVENOUS

## 2021-03-02 MED ORDER — CEFAZOLIN SODIUM-DEXTROSE 2-4 GM/100ML-% IV SOLN
INTRAVENOUS | Status: AC
  Filled 2021-03-02: qty 100

## 2021-03-02 MED ORDER — GABAPENTIN 300 MG PO CAPS
ORAL_CAPSULE | ORAL | Status: AC
  Filled 2021-03-02: qty 1

## 2021-03-02 MED ORDER — DEXMEDETOMIDINE (PRECEDEX) IN NS 20 MCG/5ML (4 MCG/ML) IV SYRINGE
PREFILLED_SYRINGE | INTRAVENOUS | Status: AC
  Filled 2021-03-02: qty 5

## 2021-03-02 MED ORDER — ROCURONIUM BROMIDE 10 MG/ML (PF) SYRINGE
PREFILLED_SYRINGE | INTRAVENOUS | Status: AC
  Filled 2021-03-02: qty 10

## 2021-03-02 MED ORDER — ROCURONIUM BROMIDE 100 MG/10ML IV SOLN
INTRAVENOUS | Status: DC | PRN
  Administered 2021-03-02: 60 mg via INTRAVENOUS

## 2021-03-02 MED ORDER — CEFAZOLIN SODIUM-DEXTROSE 2-4 GM/100ML-% IV SOLN
2.0000 g | INTRAVENOUS | Status: AC
  Administered 2021-03-02: 2 g via INTRAVENOUS

## 2021-03-02 SURGICAL SUPPLY — 48 items
APL PRP STRL LF DISP 70% ISPRP (MISCELLANEOUS) ×2
BLADE SURG 15 STRL LF DISP TIS (BLADE) ×1 IMPLANT
BLADE SURG 15 STRL SS (BLADE) ×2
CHLORAPREP W/TINT 26 (MISCELLANEOUS) ×3 IMPLANT
DRAPE IMP U-DRAPE 54X76 (DRAPES) ×2 IMPLANT
DRAPE INCISE IOBAN 66X45 STRL (DRAPES) ×2 IMPLANT
DRAPE U-SHAPE 47X51 STRL (DRAPES) ×4 IMPLANT
DRSG PAD ABDOMINAL 8X10 ST (GAUZE/BANDAGES/DRESSINGS) ×2 IMPLANT
DRSG TEGADERM 4X10 (GAUZE/BANDAGES/DRESSINGS) ×1 IMPLANT
ELECT BLADE 4.0 EZ CLEAN MEGAD (MISCELLANEOUS) ×2
ELECT REM PT RETURN 9FT ADLT (ELECTROSURGICAL) ×2
ELECTRODE BLDE 4.0 EZ CLN MEGD (MISCELLANEOUS) IMPLANT
ELECTRODE REM PT RTRN 9FT ADLT (ELECTROSURGICAL) ×1 IMPLANT
GAUZE SPONGE 4X4 12PLY STRL (GAUZE/BANDAGES/DRESSINGS) ×2 IMPLANT
GAUZE XEROFORM 1X8 LF (GAUZE/BANDAGES/DRESSINGS) ×2 IMPLANT
GLOVE SRG 8 PF TXTR STRL LF DI (GLOVE) ×1 IMPLANT
GLOVE SURG ENC MOIS LTX SZ6 (GLOVE) ×4 IMPLANT
GLOVE SURG ENC MOIS LTX SZ7.5 (GLOVE) ×2 IMPLANT
GLOVE SURG LTX SZ8 (GLOVE) ×2 IMPLANT
GLOVE SURG UNDER POLY LF SZ6.5 (GLOVE) ×2 IMPLANT
GLOVE SURG UNDER POLY LF SZ8 (GLOVE) ×2
GOWN STRL REUS W/ TWL LRG LVL3 (GOWN DISPOSABLE) ×2 IMPLANT
GOWN STRL REUS W/TWL LRG LVL3 (GOWN DISPOSABLE) ×4
GOWN STRL REUS W/TWL XL LVL3 (GOWN DISPOSABLE) ×2 IMPLANT
HANDLE YANKAUER SUCT BULB TIP (MISCELLANEOUS) ×2 IMPLANT
PACK ARTHROSCOPY DSU (CUSTOM PROCEDURE TRAY) ×2 IMPLANT
PACK BASIN DAY SURGERY FS (CUSTOM PROCEDURE TRAY) ×2 IMPLANT
PENCIL SMOKE EVAC W/HOLSTER (ELECTROSURGICAL) ×2 IMPLANT
SHEET MEDIUM DRAPE 40X70 STRL (DRAPES) IMPLANT
SLEEVE SCD COMPRESS KNEE MED (STOCKING) ×2 IMPLANT
SLING ARM FOAM STRAP MED (SOFTGOODS) ×1 IMPLANT
SPONGE T-LAP 18X18 ~~LOC~~+RFID (SPONGE) ×2 IMPLANT
SPONGE T-LAP 4X18 ~~LOC~~+RFID (SPONGE) ×3 IMPLANT
SUCTION FRAZIER HANDLE 12FR (TUBING) ×2
SUCTION TUBE FRAZIER 12FR DISP (TUBING) ×1 IMPLANT
SUT ETHILON 3 0 PS 1 (SUTURE) ×3 IMPLANT
SUT FIBERWIRE #2 38 T-5 BLUE (SUTURE)
SUT PDS AB 1 CT  36 (SUTURE)
SUT PDS AB 1 CT 36 (SUTURE) IMPLANT
SUT VIC AB 0 CT1 27 (SUTURE)
SUT VIC AB 0 CT1 27XCR 8 STRN (SUTURE) IMPLANT
SUT VIC AB 2-0 CT1 27 (SUTURE) ×2
SUT VIC AB 2-0 CT1 TAPERPNT 27 (SUTURE) IMPLANT
SUT VIC AB 2-0 SH 27 (SUTURE)
SUT VIC AB 2-0 SH 27XBRD (SUTURE) IMPLANT
SUTURE FIBERWR #2 38 T-5 BLUE (SUTURE) IMPLANT
SYR BULB EAR ULCER 2OZ BL STRL (SYRINGE) ×2 IMPLANT
TOWEL GREEN STERILE FF (TOWEL DISPOSABLE) ×4 IMPLANT

## 2021-03-02 NOTE — Progress Notes (Signed)
AssistedDr. Houser with left, ultrasound guided, interscalene  block. Side rails up, monitors on throughout procedure. See vital signs in flow sheet. Tolerated Procedure well.  

## 2021-03-02 NOTE — Anesthesia Procedure Notes (Signed)
Procedure Name: Intubation Date/Time: 03/02/2021 7:47 AM Performed by: Verita Lamb, CRNA Pre-anesthesia Checklist: Patient identified, Emergency Drugs available, Suction available and Patient being monitored Patient Re-evaluated:Patient Re-evaluated prior to induction Oxygen Delivery Method: Circle system utilized Preoxygenation: Pre-oxygenation with 100% oxygen Induction Type: IV induction Ventilation: Mask ventilation without difficulty Laryngoscope Size: Mac and 4 Grade View: Grade I Tube type: Oral Tube size: 7.0 mm Number of attempts: 1 Airway Equipment and Method: Stylet and Oral airway Placement Confirmation: ETT inserted through vocal cords under direct vision, positive ETCO2, breath sounds checked- equal and bilateral and CO2 detector Secured at: 23 cm Tube secured with: Tape Dental Injury: Teeth and Oropharynx as per pre-operative assessment

## 2021-03-02 NOTE — Transfer of Care (Signed)
Immediate Anesthesia Transfer of Care Note  Patient: [name]  Procedure(s) Performed: Left Shoulder HARDWARE REMOVAL (Left)  Patient Location: PACU  Anesthesia Type:General and Regional  Level of Consciousness: awake, alert  and oriented  Airway & Oxygen Therapy: Patient Spontanous Breathing and Patient connected to face mask oxygen  Post-op Assessment: Report given to RN and Post -op Vital signs reviewed and stable  Post vital signs: Reviewed and stable  Last Vitals:  Vitals Value Taken Time  BP 142/93 03/02/21 0904  Temp    Pulse 84 03/02/21 0905  Resp    SpO2 99 % 03/02/21 0905  Vitals shown include unvalidated device data.  Last Pain:  Vitals:   03/02/21 0639  TempSrc: Oral  PainSc: 9       Patients Stated Pain Goal: 6 (03/02/21 7353)  Complications: No notable events documented.

## 2021-03-02 NOTE — Brief Op Note (Signed)
° °  Brief Op Note  Date of Surgery: 03/02/2021  Preoperative Diagnosis: Left Shoulder painful Hardware  Postoperative Diagnosis: same  Procedure: Procedure(s): Left Shoulder HARDWARE REMOVAL  Implants: Implant Name Type Inv. Item Serial No. Manufacturer Lot No. LRB No. Used Action  SCREW T15 MD 3.5X50MM NS - [id number] Screw SCREW T15 MD 3.5X50MM NS  [employer](ORTH,TRAU,BIO,SG)  Left 1 Explanted  SCREW T15 MD 3.5X52MM NS - [id number] Screw SCREW T15 MD 3.5X52MM NS  [employer](ORTH,TRAU,BIO,SG)  Left 1 Explanted  SCREW T15 MD 3.5X42MM NS - [id number] Screw SCREW T15 MD 3.5X42MM NS  [employer](ORTH,TRAU,BIO,SG)  Left 1 Explanted  SCREW T15 MD 3.5X44MM NS - [id number] Screw SCREW T15 MD 3.5X44MM NS  [employer](ORTH,TRAU,BIO,SG)  Left 1 Explanted  SCREW LOW PROF TIS 3.5X28MM - [id number] Screw SCREW LOW PROF TIS 3.5X28MM  [employer](ORTH,TRAU,BIO,SG)  Left 2 Explanted  SCREW LP NL T15 3.5X26 - [id number] Screw SCREW LP NL T15 3.5X26  [employer](ORTH,TRAU,BIO,SG)  Left 2 Explanted  SCREW LOCK CORT STAR 3.5X32 - [id number] Screw SCREW LOCK CORT STAR 3.5X32  [employer](ORTH,TRAU,BIO,SG)  Left 3 Explanted  SCREW LOCK CORT STAR 3.5X36 - [id number] Screw SCREW LOCK CORT STAR 3.5X36  [employer](ORTH,TRAU,BIO,SG)  Left 1 Explanted  SCREW LOCK CORT STAR 3.5X28 - [id number] Screw SCREW LOCK CORT STAR 3.5X28  [employer](ORTH,TRAU,BIO,SG)  Left 1 Explanted  SCREW LOCK CORT STAR 3.5X30 - [id number] Screw SCREW LOCK CORT STAR 3.5X30  [employer](ORTH,TRAU,BIO,SG)  Left 1 Explanted  PLATE PROX HUMERUS HI LT 4H - [id number] Plate PLATE PROX HUMERUS HI LT 4H  [employer](ORTH,TRAU,BIO,SG)  Left 1 Explanted    Surgeons: Surgeon(s): Vanetta Mulders, MD  Anesthesia: General    Estimated Blood Loss: See anesthesia record  Complications: None  Condition to PACU: Stable  Yevonne Pax, MD 03/02/2021 8:55 AM

## 2021-03-02 NOTE — Anesthesia Procedure Notes (Addendum)
Anesthesia Regional Block: Interscalene brachial plexus block   Pre-Anesthetic Checklist: , timeout performed,  Correct Patient, Correct Site, Correct Laterality,  Correct Procedure, Correct Position, site marked,  Risks and benefits discussed,  Surgical consent,  Pre-op evaluation,  At surgeon's request and post-op pain management  Laterality: Upper and Left  Prep: Maximum Sterile Barrier Precautions used, chloraprep       Needles:  Injection technique: Single-shot  Needle Type: Echogenic Needle     Needle Length: 5cm  Needle Gauge: 21     Additional Needles:   Procedures:,,,, ultrasound used (permanent image in chart),,    Narrative:  Start time: 03/02/2021 7:01 AM End time: 03/02/2021 7:06 AM Injection made incrementally with aspirations every 5 mL.  Performed by: Personally  Anesthesiologist: Trevor Iha, MD  Additional Notes: Block assessed prior to procedure. Patient tolerated procedure well.

## 2021-03-02 NOTE — Op Note (Signed)
Date of Surgery: 03/02/2021  INDICATIONS: [name] is a 41 y.o.-year-old male status post left shoulder ORIF who subsequently had has prominent hardware with screw penetration through the articular surface.  The risk and benefits of the procedure with discussed in detail and documented in the pre-operative evaluation.  PREOPERATIVE DIAGNOSIS: 1. Left symptomatic shoulder hardware  POSTOPERATIVE DIAGNOSIS: Same.  PROCEDURE: 1. Left shoulder removal of hardware 2. Left shoulder manipulation under anesthesia  SURGEON: Benancio Deeds MD  ASSISTANT: Kerby Less, ATC; necessary for the timely completion of procedure and due to complexity of procedure.  ANESTHESIA:  general plus interscalene block  IV FLUIDS AND URINE: See anesthesia record.  ANTIBIOTICS: Ancef 2g  ESTIMATED BLOOD LOSS: 50 mL.  IMPLANTS:  Implant Name Type Inv. Item Serial No. Manufacturer Lot No. LRB No. Used Action  SCREW T15 MD 3.5X50MM NS - [id number] Screw SCREW T15 MD 3.5X50MM NS  [employer](ORTH,TRAU,BIO,SG)  Left 1 Explanted  SCREW T15 MD 3.5X52MM NS - [id number] Screw SCREW T15 MD 3.5X52MM NS  [employer](ORTH,TRAU,BIO,SG)  Left 1 Explanted  SCREW T15 MD 3.5X42MM NS - [id number] Screw SCREW T15 MD 3.5X42MM NS  [employer](ORTH,TRAU,BIO,SG)  Left 1 Explanted  SCREW T15 MD 3.5X44MM NS - [id number] Screw SCREW T15 MD 3.5X44MM NS  [employer](ORTH,TRAU,BIO,SG)  Left 1 Explanted  SCREW LOW PROF TIS 3.5X28MM - [id number] Screw SCREW LOW PROF TIS 3.5X28MM  [employer](ORTH,TRAU,BIO,SG)  Left 2 Explanted  SCREW LP NL T15 3.5X26 - [id number] Screw SCREW LP NL T15 3.5X26  [employer](ORTH,TRAU,BIO,SG)  Left 2 Explanted  SCREW LOCK CORT STAR 3.5X32 - [id number] Screw SCREW LOCK CORT STAR 3.5X32  [employer](ORTH,TRAU,BIO,SG)  Left 3 Explanted  SCREW LOCK CORT STAR 3.5X36 - [id number] Screw SCREW LOCK CORT STAR 3.5X36  [employer](ORTH,TRAU,BIO,SG)  Left 1 Explanted  SCREW LOCK CORT STAR 3.5X28 - [id number] Screw  SCREW LOCK CORT STAR 3.5X28  [employer](ORTH,TRAU,BIO,SG)  Left 1 Explanted  SCREW LOCK CORT STAR 3.5X30 - [id number] Screw SCREW LOCK CORT STAR 3.5X30  [employer](ORTH,TRAU,BIO,SG)  Left 1 Explanted  PLATE PROX HUMERUS HI LT 4H - [id number] Plate PLATE PROX HUMERUS HI LT 4H  [employer](ORTH,TRAU,BIO,SG)  Left 1 Explanted    DRAINS: None  CULTURES: None  COMPLICATIONS: none  DESCRIPTION OF PROCEDURE:  Was identified in the preoperative holding area.  The correct site was marked according to universal protocol with nursing.  Anesthesia subsequently performed interscalene long-acting block.  He is subsequently taken back to the operating room.  Anesthesia was induced.  He was prepped and draped in the usual sterile fashion in the beachchair position with all bony prominences padded.  Ancef 2 g was given prior to skin incision 1 hour before.  Final timeout was performed.  We began with an approach to the deltopectoral space.  15 blade was used to incise just through skin.  Layer by layer dissection was then subsequently performed with electrocautery with care to identify and protect the cephalic vein.  It should be noted that due to altered tissue planes from previous ORIF the surgery was additionally complex and required additional effort.  The plate was identified and electrocautery was used to fully clear this area.  Rondure was used to clear the screw tracts and screw heads.  At this time the screws were removed sequentially.  The plate was elevated with a Cobb elevator.  The tissue from under the plate was then removed with rongeur.  The wounds were thoroughly irrigated.  Vancomycin 1 g powder  was then placed in the wound.  This was closed in layers of 0 Vicryl 2-0 Vicryl and 3-0 nylon.  Final x-ray confirmed that the plate was removed.  There is no interval movement of the fracture fragments.  At this time the arm was forward elevated and externally rotated with manipulation under anesthesia in  order to optimize range of motion.  Prior to removal of hardware range of motion was 10 degrees external rotation 80 degrees forward elevation, following this it was 125 of forward elevation and 35 external rotation   Kerby Less ATC was necessary for opening, closing, retracting, limb positioning and overall facilitation and timely completion of the procedure.     POSTOPERATIVE PLAN: He will be activity as tolerated on the left upper extremity.  He will participate in physical therapy to increase his range of motion.  I will see him back in 2 weeks for wound check.  Benancio Deeds, MD 8:56 AM

## 2021-03-02 NOTE — Anesthesia Postprocedure Evaluation (Signed)
Anesthesia Post Note  Patient: [name]  Procedure(s) Performed: Left Shoulder HARDWARE REMOVAL (Left)     Patient location during evaluation: PACU Anesthesia Type: Regional Level of consciousness: awake and alert Pain management: pain level controlled Vital Signs Assessment: post-procedure vital signs reviewed and stable Respiratory status: spontaneous breathing, nonlabored ventilation, respiratory function stable and patient connected to nasal cannula oxygen Cardiovascular status: stable and blood pressure returned to baseline Postop Assessment: no apparent nausea or vomiting Anesthetic complications: no   No notable events documented.  Last Vitals:  Vitals:   03/02/21 0915 03/02/21 0940  BP: 136/79 140/89  Pulse: 85 88  Resp: 15 18  Temp:  36.4 C  SpO2: 100% 98%    Last Pain:  Vitals:   03/02/21 0940  TempSrc:   PainSc: 5                  Barnet Glasgow

## 2021-03-02 NOTE — Interval H&P Note (Signed)
History and Physical Interval Note:  03/02/2021 6:54 AM  [name]  has presented today for surgery, with the diagnosis of Left Shoulder painful Hardware.  The various methods of treatment have been discussed with the patient and family. After consideration of risks, benefits and other options for treatment, the patient has consented to  Procedure(s): Left Shoulder HARDWARE REMOVAL (Left) as a surgical intervention.  The patient's history has been reviewed, patient examined, no change in status, stable for surgery.  I have reviewed the patient's chart and labs.  Questions were answered to the patient's satisfaction.     [name]

## 2021-03-02 NOTE — H&P (Signed)
Post Operative Evaluation      Procedure/Date of Surgery: 12/12/20 left proximal humerus open reduction internal fixation   Interval History:    01/30/2021: Presents today status post left proximal humerus open reduction internal fixation.  He continues to have very limited ability to abduct or move the arm.  In a sling most of the time.  He states that he experiences spasms in the left arm.   Presents today for follow-up of the left shoulder.  Overall states that the pain is much improved.  He has been working on gentle range of motion about the elbow and wrist.  He has abstained from physical therapy as I have asked in order to allow for interval healing of the shoulder.   PMH/PSH/Family History/Social History/Meds/Allergies:         Past Medical History:  Diagnosis Date   ADHD (attention deficit hyperactivity disorder)      ADHD   Anemia     Anxiety     Benzodiazepine abuse (HCC)     Chronic tachycardia      Per Dr. Doreene Eland (psych)   Complication of anesthesia      was hard to put him under anesthesia   Depression      clinical depression   Drug use     GERD (gastroesophageal reflux disease)     Headache     History of IBS     Mood disorder (HCC)     Pneumonia      2017, lung collapse   Polysubstance abuse (HCC)     PTSD (post-traumatic stress disorder)           Past Surgical History:  Procedure Laterality Date   ADENOIDECTOMY       INNER EAR SURGERY       MULTIPLE TOOTH EXTRACTIONS       ORIF HUMERUS FRACTURE Left 12/12/2020    Procedure: OPEN REDUCTION INTERNAL FIXATION (ORIF) LEFT HUMERUS FRACTURE;  Surgeon: [name], MD;  Location: [facility] OR;  Service: Orthopedics;  Laterality: Left;  estimated operating time 2 hours.   WISDOM TOOTH EXTRACTION        Social History         Socioeconomic History   Marital status: Single      Spouse name: Not on file   Number of children: Not on file   Years of education: Not on file   Highest education level: Not  on file  Occupational History   Occupation: disabled  Tobacco Use   Smoking status: Every Day      Packs/day: 0.50      Types: Cigarettes   Smokeless tobacco: Never  Vaping Use   Vaping Use: Former  Substance and Sexual Activity   Alcohol use: Never   Drug use: Yes      Types: IV, Cocaine, Marijuana, Amphetamines, Hydrocodone, Oxycodone      Comment: no longer uses cocaine or marijuana   Sexual activity: Yes      Birth control/protection: Condom  Other Topics Concern   Not on file  Social History Narrative    n/a    Social Determinants of Health    Financial Resource Strain: Not on file  Food Insecurity: Not on file  Transportation Needs: Not on file  Physical Activity: Not on file  Stress: Not on file  Social Connections: Not on file    No family history on file.      Allergies  Allergen Reactions   Compazine [Prochlorperazine Edisylate] Other (  See Comments)      Blood pressure drops   Haldol [Haloperidol]        Restless legs   Phenergan [Promethazine Hcl] Other (See Comments)      Blood pressure drops    Reglan [Metoclopramide]        Restless legs   Tramadol Other (See Comments)      Blood pressure drops           Current Outpatient Medications  Medication Sig Dispense Refill   albuterol (VENTOLIN HFA) 108 (90 Base) MCG/ACT inhaler Inhale 1-2 puffs into the lungs every 6 (six) hours as needed for wheezing or shortness of breath.       aspirin EC 325 MG tablet Take 1 tablet (325 mg total) by mouth daily. 30 tablet 0   buprenorphine-naloxone (SUBOXONE) 8-2 mg SUBL SL tablet Place 1-2 tablets under the tongue See admin instructions. 2 films in the morning 1 film in the evening       carisoprodol (SOMA) 350 MG tablet Take 1 tablet (350 mg total) by mouth 4 (four) times daily as needed for muscle spasms. 30 tablet 2   Fluvoxamine Maleate 100 MG CP24 Take 200 mg by mouth daily. (Patient not taking: No sig reported)       gabapentin (NEURONTIN) 600 MG tablet Take  600 mg by mouth 3 (three) times daily.       ibuprofen (ADVIL) 200 MG tablet Take 800 mg by mouth every 6 (six) hours as needed for headache or moderate pain.       LORazepam (ATIVAN) 1 MG tablet Take 1 mg by mouth 4 (four) times daily.       methocarbamol (ROBAXIN) 500 MG tablet Take 1 tablet (500 mg) by mouth twice daily. 30 tablet 2   naloxone (NARCAN) nasal spray 4 mg/0.1 mL Place into the nose. 2 each 0   naproxen (NAPROSYN) 500 MG tablet Take 500 mg by mouth 2 (two) times daily as needed for moderate pain.       ondansetron (ZOFRAN ODT) 4 MG disintegrating tablet Take 1 tablet (4 mg total) by mouth every 8 (eight) hours as needed for nausea or vomiting. 10 tablet 0   oxyCODONE (OXYCONTIN) 10 mg 12 hr tablet Take 1 tablet (10 mg total) by mouth every 12 (twelve) hours. 30 tablet 0   oxyCODONE (ROXICODONE) 15 MG immediate release tablet Take 1 tablet (15 mg total) by mouth every 4 (four) hours as needed for pain. 40 tablet 0   oxyCODONE-acetaminophen (PERCOCET) 5-325 MG tablet Take 1-2 tablets by mouth every 8 (eight) hours as needed for severe pain. 30 tablet 0   polyethylene glycol powder (GLYCOLAX/MIRALAX) 17 GM/SCOOP powder Take 17 g by mouth at bedtime.       temazepam (RESTORIL) 15 MG capsule Take 15 mg by mouth at bedtime.       triamcinolone (KENALOG) 0.025 % cream Apply 1 application topically 2 (two) times daily as needed (for rash).        No current facility-administered medications for this visit.    Imaging Results (Last 48 hours)  No results found.     Review of Systems:   A ROS was performed including pertinent positives and negatives as documented in the HPI.     Musculoskeletal Exam:     There were no vitals taken for this visit.   Left incision is clean dry and intact without erythema or drainage.  Able to extend at the left wrist and thumb.  Sensation is intact over the axillary distribution   Imaging:     2 views left humerus: There is settling of the humeral  head and partial screw penetration as result.  This is overall mild.  There is overall healing of the proximal humerus.   CT left humerus: Increased healing and consolidation around left proximal humerus  I personally reviewed and interpreted the radiographs.     Assessment:   42 year old male status post left proximal humerus open reduction internal fixation.  There is interval healing of the proximal humerus.  At this point I believe that there is enough healing that I would like to remove the hardware allow for him to increase his motion. Plan :      -Plan for left shoulder removal of hardware      After a lengthy discussion of treatment options, including risks, benefits, alternatives, complications of surgical and nonsurgical conservative options, the patient elected surgical repair.    The patient  is aware of the material risks  and complications including, but not limited to injury to adjacent structures, neurovascular injury, infection, numbness, bleeding, implant failure, thermal burns, stiffness, persistent pain, failure to heal, disease transmission from allograft, need for further surgery, dislocation, anesthetic risks, blood clots, risks of death,and others. The probabilities of surgical success and failure discussed with patient given their particular co-morbidities.The time and nature of expected rehabilitation and recovery was discussed.The patient's questions were all answered preoperatively.  No barriers to understanding were noted. I explained the natural history of the disease process and Rx rationale.  I explained to the patient what I considered to be reasonable expectations given their personal situation.  The final treatment plan was arrived at through a shared patient decision making process model.

## 2021-03-02 NOTE — Discharge Instructions (Addendum)
Discharge Instructions    Attending Surgeon: [name], MD Office Phone Number: [phone]   Diagnosis and Procedures:    Surgeries Performed: Left shoulder removal of hardware  Discharge Plan:    Diet: Resume usual diet. Begin with light or bland foods.  Drink plenty of fluids.  Activity:  Keep sling and dressing in place until your block wears off, you may be activity as tolerated at that time.  GENERAL INSTRUCTIONS: 1.  Keep your surgical site elevated above your heart for at least 5-7 days or longer to prevent swelling. This will improve your comfort and your overall recovery following surgery.     2. Please call Dr. Serena Croissant office at [phone] with questions Monday-Friday during business hours. If no one answers, please leave a message and someone should get back to the patient within 24 hours. For emergencies please call 911 or proceed to the emergency room.   3. Patient to notify surgical team if experiences any of the following: Bowel/Bladder dysfunction, uncontrolled pain, nerve/muscle weakness, incision with increased drainage or redness, nausea/vomiting and Fever greater than 101.0 F.  Be alert for signs of infection including redness, streaking, odor, fever or chills. Be alert for excessive pain or bleeding and notify your surgeon immediately.  WOUND INSTRUCTIONS:   Leave your dressing/cast/splint in place until your post operative visit.  Keep it clean and dry.  Always keep the incision clean and dry until the staples/sutures are removed. If there is no drainage from the incision you should keep it open to air. If there is drainage from the incision you must keep it covered at all times until the drainage stops  Do not soak in a bath tub, hot tub, pool, lake or other body of water until 21 days after your surgery and your incision is completely dry and healed.  If you have removable sutures (or staples) they must be removed 10-14 days (unless  otherwise instructed) from the day of your surgery.     1)  Elevate the extremity as much as possible.  2)  Keep the dressing clean and dry.  3)  Please call us if the dressing becomes wet or dirty.  4)  If you are experiencing worsening pain or worsening swelling, please call.     MEDICATIONS: Resume all previous home medications at the previous prescribed dose and frequency unless otherwise noted Start taking the  pain medications on an as-needed basis as prescribed  Please taper down pain medication over the next week following surgery.  Ideally you should not require a refill of any narcotic pain medication.  Take pain medication with food to minimize nausea. In addition to the prescribed pain medication, you may take over-the-counter pain relievers such as Tylenol.  Do NOT take additional tylenol if your pain medication already has tylenol in it.  Aspirin 325mg  daily for four weeks.      FOLLOWUP INSTRUCTIONS: 1. Follow up at the Physical Therapy [facility] 3-4 days following surgery. This appointment should be scheduled unless other arrangements have been made.The Physical Therapy scheduling number is [phone] if an appointment has not already been arranged.  2. Contact Dr. Serena Croissant office during office hours at [phone] or the practice after hours line at [phone] for non-emergencies. For medical emergencies call 911.   Discharge Location: Home   You may have you next dose of Tylenol after 1pm today, if needed.   Post Anesthesia Home Care Instructions  Activity: Get plenty of rest for the  remainder of the day. A responsible individual must stay with you for 24 hours following the procedure.  For the next 24 hours, DO NOT: -Drive a car -Paediatric nurse -Drink alcoholic beverages -Take any medication unless instructed by your physician -Make any legal decisions or sign important papers.  Meals: Start with liquid foods such as gelatin or soup. Progress to  regular foods as tolerated. Avoid greasy, spicy, heavy foods. If nausea and/or vomiting occur, drink only clear liquids until the nausea and/or vomiting subsides. Call your physician if vomiting continues.  Special Instructions/Symptoms: Your throat may feel dry or sore from the anesthesia or the breathing tube placed in your throat during surgery. If this causes discomfort, gargle with warm salt water. The discomfort should disappear within 24 hours.  If you had a scopolamine patch placed behind your ear for the management of post- operative nausea and/or vomiting:  1. The medication in the patch is effective for 72 hours, after which it should be removed.  Wrap patch in a tissue and discard in the trash. Wash hands thoroughly with soap and water. 2. You may remove the patch earlier than 72 hours if you experience unpleasant side effects which may include dry mouth, dizziness or visual disturbances. 3. Avoid touching the patch. Wash your hands with soap and water after contact with the patch.    Regional Anesthesia Blocks  1. Numbness or the inability to move the "blocked" extremity may last from 3-48 hours after placement. The length of time depends on the medication injected and your individual response to the medication. If the numbness is not going away after 48 hours, call your surgeon.  2. The extremity that is blocked will need to be protected until the numbness is gone and the  Strength has returned. Because you cannot feel it, you will need to take extra care to avoid injury. Because it may be weak, you may have difficulty moving it or using it. You may not know what position it is in without looking at it while the block is in effect.  3. For blocks in the legs and feet, returning to weight bearing and walking needs to be done carefully. You will need to wait until the numbness is entirely gone and the strength has returned. You should be able to move your leg and foot normally before you  try and bear weight or walk. You will need someone to be with you when you first try to ensure you do not fall and possibly risk injury.  4. Bruising and tenderness at the needle site are common side effects and will resolve in a few days.  5. Persistent numbness or new problems with movement should be communicated to the surgeon or the [facility] [phone] [place] [phone]). Information for Discharge Teaching: EXPAREL (bupivacaine liposome injectable suspension)   Your surgeon or anesthesiologist gave you EXPAREL(bupivacaine) to help control your pain after surgery.  EXPAREL is a local anesthetic that provides pain relief by numbing the tissue around the surgical site. EXPAREL is designed to release pain medication over time and can control pain for up to 72 hours. Depending on how you respond to EXPAREL, you may require less pain medication during your recovery.  Possible side effects: Temporary loss of sensation or ability to move in the area where bupivacaine was injected. Nausea, vomiting, constipation Rarely, numbness and tingling in your mouth or lips, lightheadedness, or anxiety may occur. Call your doctor right away if you think  you may be experiencing any of these sensations, or if you have other questions regarding possible side effects.  Follow all other discharge instructions given to you by your surgeon or nurse. Eat a healthy diet and drink plenty of water or other fluids.  If you return to the hospital for any reason within 96 hours following the administration of EXPAREL, it is important for health care providers to know that you have received this anesthetic. A teal colored band has been placed on your arm with the date, time and amount of EXPAREL you have received in order to alert and inform your health care providers. Please leave this armband in place for the full 96 hours following administration, and then you may remove the band.

## 2021-03-03 ENCOUNTER — Encounter (HOSPITAL_BASED_OUTPATIENT_CLINIC_OR_DEPARTMENT_OTHER): Payer: Self-pay | Admitting: Orthopaedic Surgery

## 2021-03-07 ENCOUNTER — Other Ambulatory Visit (HOSPITAL_COMMUNITY): Payer: Self-pay

## 2021-03-08 ENCOUNTER — Other Ambulatory Visit (HOSPITAL_COMMUNITY): Payer: Self-pay

## 2021-03-10 ENCOUNTER — Other Ambulatory Visit (HOSPITAL_BASED_OUTPATIENT_CLINIC_OR_DEPARTMENT_OTHER): Payer: Self-pay | Admitting: Orthopaedic Surgery

## 2021-03-10 ENCOUNTER — Encounter (HOSPITAL_BASED_OUTPATIENT_CLINIC_OR_DEPARTMENT_OTHER): Payer: Medicaid Other | Admitting: Orthopaedic Surgery

## 2021-03-10 DIAGNOSIS — S42292S Other displaced fracture of upper end of left humerus, sequela: Secondary | ICD-10-CM

## 2021-03-13 ENCOUNTER — Encounter (HOSPITAL_BASED_OUTPATIENT_CLINIC_OR_DEPARTMENT_OTHER): Payer: Medicaid Other | Admitting: Orthopaedic Surgery

## 2021-03-16 ENCOUNTER — Other Ambulatory Visit (HOSPITAL_COMMUNITY): Payer: Self-pay

## 2021-03-21 ENCOUNTER — Encounter (HOSPITAL_BASED_OUTPATIENT_CLINIC_OR_DEPARTMENT_OTHER): Payer: Self-pay

## 2021-03-21 ENCOUNTER — Other Ambulatory Visit: Payer: Self-pay

## 2021-03-21 ENCOUNTER — Ambulatory Visit (HOSPITAL_BASED_OUTPATIENT_CLINIC_OR_DEPARTMENT_OTHER): Payer: Medicaid Other | Admitting: Physical Therapy

## 2021-03-21 ENCOUNTER — Ambulatory Visit (HOSPITAL_BASED_OUTPATIENT_CLINIC_OR_DEPARTMENT_OTHER)
Admission: RE | Admit: 2021-03-21 | Discharge: 2021-03-21 | Disposition: A | Discharge status: Home / Self Care | Payer: Medicaid Other | Source: Ambulatory Visit | Attending: Orthopaedic Surgery | Admitting: Orthopaedic Surgery

## 2021-03-21 DIAGNOSIS — X58XXXD Exposure to other specified factors, subsequent encounter: Secondary | ICD-10-CM | POA: Diagnosis not present

## 2021-03-21 DIAGNOSIS — S42292S Other displaced fracture of upper end of left humerus, sequela: Secondary | ICD-10-CM | POA: Insufficient documentation

## 2021-03-21 DIAGNOSIS — S42242D 4-part fracture of surgical neck of left humerus, subsequent encounter for fracture with routine healing: Secondary | ICD-10-CM | POA: Diagnosis not present

## 2021-03-22 ENCOUNTER — Other Ambulatory Visit (HOSPITAL_BASED_OUTPATIENT_CLINIC_OR_DEPARTMENT_OTHER): Payer: Self-pay

## 2021-03-22 ENCOUNTER — Ambulatory Visit (INDEPENDENT_AMBULATORY_CARE_PROVIDER_SITE_OTHER): Payer: Medicaid Other | Admitting: Orthopaedic Surgery

## 2021-03-22 ENCOUNTER — Other Ambulatory Visit (HOSPITAL_BASED_OUTPATIENT_CLINIC_OR_DEPARTMENT_OTHER): Payer: Self-pay | Admitting: Orthopaedic Surgery

## 2021-03-22 DIAGNOSIS — S42292S Other displaced fracture of upper end of left humerus, sequela: Secondary | ICD-10-CM

## 2021-03-22 DIAGNOSIS — M25512 Pain in left shoulder: Secondary | ICD-10-CM

## 2021-03-22 MED ORDER — OXYCODONE HCL 15 MG PO TABS: 15.0000 mg | ORAL_TABLET | ORAL | 0 refills | Status: DC | PRN
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
## 2021-03-23 ENCOUNTER — Encounter (HOSPITAL_BASED_OUTPATIENT_CLINIC_OR_DEPARTMENT_OTHER): Payer: Self-pay | Admitting: Orthopaedic Surgery

## 2021-03-23 ENCOUNTER — Other Ambulatory Visit (HOSPITAL_BASED_OUTPATIENT_CLINIC_OR_DEPARTMENT_OTHER): Payer: Self-pay

## 2021-03-23 ENCOUNTER — Telehealth (HOSPITAL_BASED_OUTPATIENT_CLINIC_OR_DEPARTMENT_OTHER): Payer: Self-pay | Admitting: Orthopaedic Surgery

## 2021-03-23 NOTE — Telephone Encounter (Signed)
Pharmacay question for [employer]

## 2021-03-23 NOTE — Progress Notes (Signed)
Received communication from [facility] pharmacy that patient was seen taking entire bottle of Oxycodone Rx after picking up Rx  °

## 2021-03-23 NOTE — Progress Notes (Deleted)
Received communication from [place] that patient was seen taking entire bottle of Oxycodone Rx after picking up Rx

## 2021-04-04 ENCOUNTER — Other Ambulatory Visit (HOSPITAL_BASED_OUTPATIENT_CLINIC_OR_DEPARTMENT_OTHER): Payer: Self-pay | Admitting: Orthopaedic Surgery

## 2021-04-15 ENCOUNTER — Other Ambulatory Visit (HOSPITAL_BASED_OUTPATIENT_CLINIC_OR_DEPARTMENT_OTHER): Payer: Self-pay | Admitting: Orthopaedic Surgery

## 2021-04-19 ENCOUNTER — Ambulatory Visit (HOSPITAL_BASED_OUTPATIENT_CLINIC_OR_DEPARTMENT_OTHER): Payer: Medicaid Other | Attending: Orthopaedic Surgery | Admitting: Physical Therapy
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
## 2021-05-22 ENCOUNTER — Other Ambulatory Visit (HOSPITAL_BASED_OUTPATIENT_CLINIC_OR_DEPARTMENT_OTHER): Payer: Self-pay | Admitting: Orthopaedic Surgery

## 2021-05-22 ENCOUNTER — Other Ambulatory Visit (HOSPITAL_BASED_OUTPATIENT_CLINIC_OR_DEPARTMENT_OTHER): Payer: Self-pay

## 2021-05-22 ENCOUNTER — Ambulatory Visit (HOSPITAL_BASED_OUTPATIENT_CLINIC_OR_DEPARTMENT_OTHER)
Admission: RE | Admit: 2021-05-22 | Discharge: 2021-05-22 | Disposition: A | Discharge status: Home / Self Care | Payer: Medicaid Other | Source: Ambulatory Visit | Attending: Orthopaedic Surgery | Admitting: Orthopaedic Surgery

## 2021-05-22 ENCOUNTER — Ambulatory Visit (INDEPENDENT_AMBULATORY_CARE_PROVIDER_SITE_OTHER): Payer: Medicaid Other | Admitting: Orthopaedic Surgery

## 2021-05-22 DIAGNOSIS — M85822 Other specified disorders of bone density and structure, left upper arm: Secondary | ICD-10-CM | POA: Diagnosis not present

## 2021-05-22 DIAGNOSIS — S42202A Unspecified fracture of upper end of left humerus, initial encounter for closed fracture: Secondary | ICD-10-CM | POA: Insufficient documentation

## 2021-05-22 DIAGNOSIS — S42292S Other displaced fracture of upper end of left humerus, sequela: Secondary | ICD-10-CM | POA: Diagnosis present

## 2021-05-22 DIAGNOSIS — Y939 Activity, unspecified: Secondary | ICD-10-CM | POA: Diagnosis not present

## 2021-05-22 DIAGNOSIS — X58XXXA Exposure to other specified factors, initial encounter: Secondary | ICD-10-CM | POA: Insufficient documentation

## 2021-05-22 MED ORDER — GABAPENTIN 800 MG PO TABS
800.0000 mg | ORAL_TABLET | Freq: Three times a day (TID) | ORAL | 0 refills | Status: AC
  Filled 2021-05-22: qty 25, 9d supply, fill #0

## 2021-05-22 NOTE — Progress Notes (Signed)
? ?                            ? ? ?Post Operative Evaluation ?  ? ?Procedure/Date of Surgery: 12/12/20 left proximal humerus open reduction internal fixation status post removal of hardware 03/02/2018 ? ?Interval History:  ? ?05/22/2021: Presents today for follow-up of the above procedures.  He has had persistent pain and limited range of motion about the left shoulder.  Continues to have pain and limited motion on the left side.  He does have pain even while at rest although this is his baseline in the setting of chronic pain. ? ?PMH/PSH/Family History/Social History/Meds/Allergies:   ? ?Past Medical History:  ?Diagnosis Date  ? ADHD (attention deficit hyperactivity disorder)   ? ADHD  ? Anemia   ? Anxiety   ? Benzodiazepine abuse (HCC)   ? Chronic tachycardia   ? Per Dr. Doreene ElandHendrix (psych)  ? Complication of anesthesia   ? was hard to put him under anesthesia  ? Depression   ? clinical depression  ? Drug use   ? GERD (gastroesophageal reflux disease)   ? Headache   ? History of IBS   ? Mood disorder (HCC)   ? Pneumonia   ? 2017, lung collapse  ? Polysubstance abuse (HCC)   ? PTSD (post-traumatic stress disorder)   ? ?Past Surgical History:  ?Procedure Laterality Date  ? ADENOIDECTOMY    ? HARDWARE REMOVAL Left 03/02/2021  ? Procedure: Left Shoulder HARDWARE REMOVAL;  Surgeon: Huel CoteBokshan, Saturnino Liew, MD;  Location: [facility];  Service: Orthopedics;  Laterality: Left;  ? INNER EAR SURGERY    ? MULTIPLE TOOTH EXTRACTIONS    ? ORIF HUMERUS FRACTURE Left 12/12/2020  ? Procedure: OPEN REDUCTION INTERNAL FIXATION (ORIF) LEFT HUMERUS FRACTURE;  Surgeon: Huel CoteBokshan, Jimi Schappert, MD;  Location: [facility];  Service: Orthopedics;  Laterality: Left;  estimated operating time 2 hours.  ? WISDOM TOOTH EXTRACTION    ? ?Social History  ? ?Socioeconomic History  ? Marital status: Single  ?  Spouse name: Not on file  ? Number of children: Not on file  ? Years of education: Not on file  ? Highest education level: Not on file  ?Occupational  History  ? Occupation: disabled  ?Tobacco Use  ? Smoking status: Every Day  ?  Packs/day: 0.50  ?  Types: Cigarettes  ? Smokeless tobacco: Never  ?Vaping Use  ? Vaping Use: Former  ?Substance and Sexual Activity  ? Alcohol use: Never  ? Drug use: Yes  ?  Types: IV, Cocaine, Marijuana, Amphetamines, Hydrocodone, Oxycodone  ?  Comment: no longer uses cocaine or marijuana  ? Sexual activity: Yes  ?  Birth control/protection: Condom  ?Other Topics Concern  ? Not on file  ?Social History Narrative  ? n/a  ? ?Social Determinants of Health  ? ?Financial Resource Strain: Not on file  ?Food Insecurity: Not on file  ?Transportation Needs: Not on file  ?Physical Activity: Not on file  ?Stress: Not on file  ?Social Connections: Not on file  ? ?No family history on file. ?Allergies  ?Allergen Reactions  ? Compazine [Prochlorperazine Edisylate] Other (See Comments)  ?  Blood pressure drops  ? Haldol [Haloperidol]   ?  Restless legs  ? Phenergan [Promethazine Hcl] Other (See Comments)  ?  Blood pressure drops   ? Reglan [Metoclopramide]   ?  Restless legs  ? Tramadol Other (See Comments)  ?  Blood pressure drops   ? ?Current Outpatient Medications  ?Medication Sig Dispense Refill  ? oxyCODONE (ROXICODONE) 15 MG immediate release tablet Take 1 tablet (15 mg total) by mouth every 4 (four) hours as needed for pain. 30 tablet 0  ? albuterol (VENTOLIN HFA) 108 (90 Base) MCG/ACT inhaler Inhale 1-2 puffs into the lungs every 6 (six) hours as needed for wheezing or shortness of breath.    ? buprenorphine-naloxone (SUBOXONE) 8-2 mg SUBL SL tablet Place 1-2 tablets under the tongue See admin instructions. 2 films in the morning ?1 film in the evening    ? carisoprodol (SOMA) 350 MG tablet Take 1 tablet (350 mg total) by mouth 4 (four) times daily as needed for muscle spasms. 30 tablet 0  ? carisoprodol (SOMA) 350 MG tablet Take 1 tablet (350 mg total) by mouth 4 (four) times daily as needed for muscle spasms. 30 tablet 2  ? Fluvoxamine  Maleate 100 MG CP24 Take 200 mg by mouth daily.    ? gabapentin (NEURONTIN) 600 MG tablet Take 600 mg by mouth 3 (three) times daily.    ? LORazepam (ATIVAN) 1 MG tablet Take 1 mg by mouth 4 (four) times daily.    ? naloxone (NARCAN) nasal spray 4 mg/0.1 mL Place into the nose. 2 each 0  ? omeprazole (PRILOSEC) 20 MG capsule Take 20 mg by mouth daily.    ? ondansetron (ZOFRAN ODT) 4 MG disintegrating tablet Take 1 tablet (4 mg total) by mouth every 8 (eight) hours as needed for nausea or vomiting. 10 tablet 0  ? oxyCODONE-acetaminophen (PERCOCET) 10-325 MG tablet Take 1 tablet by mouth every 4 (four) hours as needed for pain. 30 tablet 0  ? polyethylene glycol powder (GLYCOLAX/MIRALAX) 17 GM/SCOOP powder Take 17 g by mouth at bedtime.    ? temazepam (RESTORIL) 15 MG capsule Take 15 mg by mouth at bedtime.    ? ?No current facility-administered medications for this visit.  ? ?No results found. ? ?Review of Systems:   ?A ROS was performed including pertinent positives and negatives as documented in the HPI. ? ? ?Musculoskeletal Exam:   ? ?There were no vitals taken for this visit. ? ?Left incision is clean dry and intact without erythema or drainage.  Able to extend at the left wrist and thumb.  Sensation is intact over the axillary distribution.  Forward elevation is to 30 degrees abduction is to 30 degrees at this time ? ?Imaging:   ? 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 ? ? ?Assessment:   ?41 year old male status post left proximal humerus open reduction internal fixation status post removal of hardware left shoulder.  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  At this point he continues to smoke.  Given his young age and his smoking status I have counseled  him that at this time I do not believe that shoulder arthroplasty would necessarily be a perfect solution for him.  While this could theoretically improve his motion and pain, he does already have a high pain requirement given his history of narcotic usage.  With that being said, given the high risk for infection following arthroplasty in the setting of smoking and is very young age, I do not believe it would be reasonable to pursue arthroplasty until he  is able to quit smoking.  Should he be able to quit smoking we could further discuss this.  At that time I plan for aspiration of the shoulder prior to any type of arthroplasty.  I will plan for referral to home health.  I would like him to work on physical therapy to optimize his current shoulder and ability.  At this time I would like to also refer him to my colleague Dr. Carlis Abbott as he is interested in other forms of pain management.  I have stated that I will give him 1 additional prescription for gabapentin until he is able to establish follow-up with her.  I would also like him to get a second opinion from my partner Dr. Dorene Grebe for candidacy of shoulder arthroplasty although this time I do not believe he would be a candidate given his smoking. ? ?Plan :   ? ?-Home physical therapy ordered ?-Return to clinic in 3 months ? ? ? ? ? ? ?I personally saw and evaluated the patient, and participated in the management and treatment plan. ? ?[name], MD ?Attending Physician, Orthopedic Surgery ? ?This document was dictated using Conservation officer, historic buildings. A reasonable attempt at proof reading has been made to minimize errors. ? ?

## 2021-05-24 ENCOUNTER — Telehealth: Payer: Self-pay | Admitting: Orthopedic Surgery

## 2021-05-24 ENCOUNTER — Ambulatory Visit: Payer: Self-pay

## 2021-05-24 ENCOUNTER — Ambulatory Visit (INDEPENDENT_AMBULATORY_CARE_PROVIDER_SITE_OTHER): Payer: Medicaid Other | Admitting: Orthopedic Surgery

## 2021-05-24 DIAGNOSIS — M25412 Effusion, left shoulder: Secondary | ICD-10-CM

## 2021-05-24 DIAGNOSIS — S42292S Other displaced fracture of upper end of left humerus, sequela: Secondary | ICD-10-CM | POA: Diagnosis not present

## 2021-05-24 NOTE — Telephone Encounter (Signed)
Patient's mother Aggie Cosier called asked if patient can be referred for dry needling in (PT) The number to contact Aggie Cosier is [phone] ?

## 2021-05-25 ENCOUNTER — Telehealth: Payer: Self-pay

## 2021-05-25 NOTE — Telephone Encounter (Signed)
Patient's mother called and would like to know what's the plan for pain medication for patient.  Cb# [phone].  Please advise.  Thank you. ?

## 2021-05-25 NOTE — Telephone Encounter (Signed)
Sure why not

## 2021-05-26 ENCOUNTER — Encounter: Payer: Self-pay | Admitting: Orthopedic Surgery

## 2021-05-26 NOTE — Progress Notes (Signed)
? ?Office Visit Note ?  ?Patient: [name] [name] Chapman           ?Date of Birth: 1980/06/05           ?MRN: [id number] ?Visit Date: 05/24/2021 ?Requested by: [name] Mulders, MD ?[phone] ?[address] ?[place],  [place] [address] ?PCP: [name] [name] Chapman. ? ?Subjective: ?Chief Complaint  ?Patient presents with  ? Other  ?   ?Wants 2nd opinion on left arm  ? ? ?HPI: [name] is a 41 year old patient with left shoulder pain.  He sustained an injury after fall from the ladder in October 2022.  She underwent open reduction internal fixation of of comminuted proximal humerus fracture with significant displacement between the shaft and the head.  Patient is a smoker.  Patient is on disability.  He also has a history of opioid dependency but is currently taking Suboxone.  Did take oxycodone and Soma around the time of his multiple surgeries.  After surgery and reduction looked good but he subsequently developed avascular necrosis with expected hardware prominence which required removal of all hardware.  Radiographs recently show consolidation of what is left of the proximal humerus without evidence of overt osteomyelitis but there is absence of the humeral head to a large degree.  He reports significant pain and limitation of function with the left arm.  He denies any fevers or chills. ?             ?ROS: All systems reviewed are negative as they relate to the chief complaint within the history of present illness.  Patient denies  fevers or chills. ? ? ?Assessment & Plan: ?Visit Diagnoses:  ?1. Closed 4-part fracture of proximal end of left humerus, sequela   ?2. Effusion of joint of left shoulder   ? ? ?Plan: Impression is left humerus fracture avascular necrosis following comminuted and displaced fracture in October.  Patient has required hardware removal.  He has dysfunctional shoulder at this time with significant pain which is not unexpected.  Complicating all this is his smoking as well as Suboxone requirement.  Is  also very difficult to assess the level of deltoid function on his examination today.  Discussed with him at length his options which would be to live with this the way it is or risk another surgery with the potential for infection and incomplete pain relief and incomplete function with reverse shoulder replacement.  Today I would like to draw sed rate C-reactive protein CBC DIF along with vitamin D level and request ultrasound-guided aspiration of that left shoulder joint at the hospital.  I know that these aspirations have not been shown in the literature to be predictably helpful and reliable; however, if a culture can be obtained which is positive that would change our management.  Right now he is probably looking at reverse shoulder replacement with its attendant risk including not limited to infection nerve vessel damage potential need for revision in his lifetime as well as incomplete restoration of function and an extremely high likelihood of incomplete pain relief.  We will see him back after these diagnostic interventions in approximately 2 weeks ? ?Follow-Up Instructions: Return in about 2 weeks (around 06/07/2021).  ? ?Orders:  ?Orders Placed This Encounter  ?Procedures  ? Arthrogram  ? Sed Rate (ESR)  ? C-reactive protein  ? CBC with Differential  ? Vitamin D (25 hydroxy)  ? ?No orders of the defined types were placed in this encounter. ? ? ? ? Procedures: ?No procedures performed ? ? ?  Clinical Data: ?No additional findings. ? ?Objective: ?Vital Signs: There were no vitals taken for this visit. ? ?Physical Exam:  ? ?Constitutional: Patient appears well-developed ?HEENT:  ?Head: Normocephalic ?Eyes:EOM are normal ?Neck: Normal range of motion ?Cardiovascular: Normal rate ?Pulmonary/chest: Effort normal ?Neurologic: Patient is alert ?Skin: Skin is warm ?Psychiatric: Patient has normal mood and affect ? ? ?Ortho Exam: Ortho exam demonstrates mild swelling and warmth around that left shoulder with  well-healed surgical incision.  No palpable lymphadenopathy is present.  Motor or sensory function to the hand is intact.  Biceps triceps strength is 5- out of 5.  Difficult to fully assess his deltoid functionality.  When he tries to move the arm it looks like he is having superior escape type motion. ? ?Specialty Comments:  ?No specialty comments available. ? ?Imaging: ?No results found. ? ? ?PMFS History: ?Patient Active Problem List  ? Diagnosis Date Noted  ? Closed 4-part fracture of proximal end of left humerus   ? Painful orthopaedic hardware [name])   ? Arthrofibrosis of left shoulder   ? Closed 4-part fracture of proximal humerus, left, initial encounter 12/07/2020  ? Rib fractures 12/04/2020  ? Cellulitis of multiple sites of upper arm 10/2018  ? Opioid abuse with opioid-induced mood disorder ([place]) 10/06/2017  ? Depression   ? PTSD (post-traumatic stress disorder)   ? Tachycardia   ? Acute blood loss anemia   ? Weakness 09/12/2015  ? Respiratory failure, acute ([place])   ? Respiratory failure ([place])   ? Atelectasis   ? Altered mental status 09/03/2015  ? Opioid overdose ([place]) 08/29/2015  ? Leukocytosis 08/29/2015  ? Anemia 08/29/2015  ? Polysubstance abuse ([place]) 08/29/2015  ? Tobacco abuse 08/29/2015  ? ?Past Medical History:  ?Diagnosis Date  ? ADHD (attention deficit hyperactivity disorder)   ? ADHD  ? Anemia   ? Anxiety   ? Benzodiazepine abuse ([place])   ? Chronic tachycardia   ? Per Dr. Wallene Huh (psych)  ? Complication of anesthesia   ? was hard to put him under anesthesia  ? Depression   ? clinical depression  ? Drug use   ? GERD (gastroesophageal reflux disease)   ? Headache   ? History of IBS   ? Mood disorder ([place])   ? Pneumonia   ? 2017, lung collapse  ? Polysubstance abuse ([place])   ? PTSD (post-traumatic stress disorder)   ?  ?History reviewed. No pertinent family history.  ?Past Surgical History:  ?Procedure Laterality Date  ? ADENOIDECTOMY    ? HARDWARE REMOVAL Left 03/02/2021  ? Procedure: Left Shoulder  HARDWARE REMOVAL;  Surgeon: [name] Mulders, MD;  Location: [facility];  Service: Orthopedics;  Laterality: Left;  ? INNER EAR SURGERY    ? MULTIPLE TOOTH EXTRACTIONS    ? ORIF HUMERUS FRACTURE Left 12/12/2020  ? Procedure: OPEN REDUCTION INTERNAL FIXATION (ORIF) LEFT HUMERUS FRACTURE;  Surgeon: [name] Mulders, MD;  Location: [facility];  Service: Orthopedics;  Laterality: Left;  estimated operating time 2 hours.  ? WISDOM TOOTH EXTRACTION    ? ?Social History  ? ?Occupational History  ? Occupation: disabled  ?Tobacco Use  ? Smoking status: Every Day  ?  Packs/day: 0.50  ?  Types: Cigarettes  ? Smokeless tobacco: Never  ?Vaping Use  ? Vaping Use: Former  ?Substance and Sexual Activity  ? Alcohol use: Never  ? Drug use: Yes  ?  Types: IV, Cocaine, Marijuana, Amphetamines, Hydrocodone, Oxycodone  ?  Comment: no longer uses cocaine  or marijuana  ? Sexual activity: Yes  ?  Birth control/protection: Condom  ? ? ? ? ? ?

## 2021-05-27 LAB — CBC WITH DIFFERENTIAL/PLATELET
Absolute Monocytes: 499 cells/uL (ref 200–950)
Basophils Absolute: 31 cells/uL (ref 0–200)
Basophils Relative: 0.4 %
Eosinophils Absolute: 148 cells/uL (ref 15–500)
Eosinophils Relative: 1.9 %
HCT: 38.3 % — ABNORMAL LOW (ref 38.5–50.0)
Hemoglobin: 12.3 g/dL — ABNORMAL LOW (ref 13.2–17.1)
Lymphs Abs: 1622 cells/uL (ref 850–3900)
MCH: 28.3 pg (ref 27.0–33.0)
MCHC: 32.1 g/dL (ref 32.0–36.0)
MCV: 88 fL (ref 80.0–100.0)
MPV: 11.4 fL (ref 7.5–12.5)
Monocytes Relative: 6.4 %
Neutro Abs: 5499 cells/uL (ref 1500–7800)
Neutrophils Relative %: 70.5 %
Platelets: 331 10*3/uL (ref 140–400)
RBC: 4.35 10*6/uL (ref 4.20–5.80)
RDW: 14.8 % (ref 11.0–15.0)
Total Lymphocyte: 20.8 %
WBC: 7.8 10*3/uL (ref 3.8–10.8)

## 2021-05-27 LAB — SEDIMENTATION RATE: Sed Rate: 31 mm/h — ABNORMAL HIGH (ref 0–15)

## 2021-05-27 LAB — ANAEROBIC AND AEROBIC CULTURE

## 2021-05-27 LAB — SYNOVIAL FLUID ANALYSIS, COMPLETE

## 2021-05-27 LAB — C-REACTIVE PROTEIN: CRP: 30.1 mg/L — ABNORMAL HIGH (ref <8.0)

## 2021-05-27 LAB — VITAMIN D 25 HYDROXY (VIT D DEFICIENCY, FRACTURES): Vit D, 25-Hydroxy: 11 ng/mL — ABNORMAL LOW (ref 30–100)

## 2021-05-28 ENCOUNTER — Other Ambulatory Visit: Payer: Self-pay

## 2021-05-28 ENCOUNTER — Emergency Department (HOSPITAL_COMMUNITY)
Admission: EM | Admit: 2021-05-28 | Discharge: 2021-05-28 | Disposition: A | Discharge status: Home / Self Care | Payer: Medicaid Other | Attending: Emergency Medicine | Admitting: Emergency Medicine

## 2021-05-28 DIAGNOSIS — M25512 Pain in left shoulder: Secondary | ICD-10-CM | POA: Diagnosis present

## 2021-05-28 DIAGNOSIS — Z79899 Other long term (current) drug therapy: Secondary | ICD-10-CM | POA: Insufficient documentation

## 2021-05-28 DIAGNOSIS — G8921 Chronic pain due to trauma: Secondary | ICD-10-CM | POA: Diagnosis not present

## 2021-05-28 DIAGNOSIS — G8929 Other chronic pain: Secondary | ICD-10-CM

## 2021-05-28 DIAGNOSIS — F1120 Opioid dependence, uncomplicated: Secondary | ICD-10-CM | POA: Insufficient documentation

## 2021-05-28 MED ORDER — LIDOCAINE 5 % EX PTCH: 1.0000 | MEDICATED_PATCH | CUTANEOUS | Status: DC

## 2021-05-28 MED ORDER — KETOROLAC TROMETHAMINE 30 MG/ML IJ SOLN: 30.0000 mg | Freq: Once | INTRAMUSCULAR | Status: DC

## 2021-05-28 NOTE — Discharge Instructions (Signed)
I am sorry that you are in so much pain, but with the complications of your suboxone use and your chronic pain, we unfortunately can't give you additional narcotics here in the emergency department. You really need to be managed with a pain management center who can help you better with this. ?

## 2021-05-28 NOTE — ED Triage Notes (Signed)
Pt came from home for increased 10/10 shoulder pain and left arm pain. Pt had hardware removed in shoulder in January. Pt reports pain is increasingly intolerable.    ?

## 2021-05-28 NOTE — ED Notes (Signed)
Pt walked out of room and stated "fuck this shit hospital Im leaving, this is the worst hospital." Flipped of the staff and walked out of the front door. EDP notified, and pt was placed up for discharged before EDP notified, pt did not have an IV. ?

## 2021-05-28 NOTE — ED Provider Notes (Signed)
?[facility] EMERGENCY DEPARTMENT ?Provider Note ? ? ?CSN: [id number] ?Arrival date & time: 05/28/21  1650 ? ?  ? ?History ? ?Chief Complaint  ?Patient presents with  ? Shoulder Pain  ? ? ?[name] is a 41 y.o. male with history of open reduction internal fixation of comminuted proximal humerus fracture that was done by Dr. Steward Drone in October 2022.  Patient also has a history of opioid dependency currently taking Suboxone.  Shortly after the surgery, he developed avascular necrosis and required removal of all hardware.  This patient's pain has become increasingly intolerable and he had an appointment with Dr. August Saucer at the same orthopedic office 4 days ago where they discussed his options at length.  Patient was told that his 2 options were that he can either undergo complete shoulder replacement surgery or except that he may live with a baseline level of pain.  Patient has been told to initiate care with a pain management clinic but has been having trouble finding a place to take him.  He notes that he called the surgeon today for his pain and they were unable to do anything for him and referred him to the hospital. ? ? ?Shoulder Pain ? ?  ? ?Home Medications ?Prior to Admission medications   ?Medication Sig Start Date End Date Taking? Authorizing Provider  ?oxyCODONE (ROXICODONE) 15 MG immediate release tablet Take 1 tablet (15 mg total) by mouth every 4 (four) hours as needed for pain. 03/22/21   [name], MD  ?albuterol (VENTOLIN HFA) 108 (90 Base) MCG/ACT inhaler Inhale 1-2 puffs into the lungs every 6 (six) hours as needed for wheezing or shortness of breath.    [provider]  ?buprenorphine-naloxone (SUBOXONE) 8-2 mg SUBL SL tablet Place 1-2 tablets under the tongue See admin instructions. 2 films in the morning ?1 film in the evening    [provider]  ?carisoprodol (SOMA) 350 MG tablet Take 1 tablet (350 mg total) by mouth 4 (four) times daily as needed for  muscle spasms. 01/30/21   [name], MD  ?carisoprodol (SOMA) 350 MG tablet Take 1 tablet (350 mg total) by mouth 4 (four) times daily as needed for muscle spasms. 02/27/21   [name], MD  ?Fluvoxamine Maleate 100 MG CP24 Take 200 mg by mouth daily. 11/29/20   [provider]  ?gabapentin (NEURONTIN) 600 MG tablet Take 600 mg by mouth 3 (three) times daily. 11/11/20   [provider]  ?gabapentin (NEURONTIN) 800 MG tablet Take 1 tablet (800 mg total) by mouth 3 (three) times daily. 05/22/21   [name], MD  ?LORazepam (ATIVAN) 1 MG tablet Take 1 mg by mouth 4 (four) times daily. 11/29/20   [provider]  ?naloxone (NARCAN) nasal spray 4 mg/0.1 mL Place into the nose. 12/08/20     ?omeprazole (PRILOSEC) 20 MG capsule Take 20 mg by mouth daily.    [provider]  ?ondansetron (ZOFRAN ODT) 4 MG disintegrating tablet Take 1 tablet (4 mg total) by mouth every 8 (eight) hours as needed for nausea or vomiting. 09/07/18   Ward, Chase Picket, PA-C  ?oxyCODONE-acetaminophen (PERCOCET) 10-325 MG tablet Take 1 tablet by mouth every 4 (four) hours as needed for pain. 03/02/21   [name], MD  ?polyethylene glycol powder (GLYCOLAX/MIRALAX) 17 GM/SCOOP powder Take 17 g by mouth at bedtime.    [provider]  ?temazepam (RESTORIL) 15 MG capsule Take 15 mg by mouth at bedtime.    [provider]  ?   ? ?Allergies    ?Compazine [prochlorperazine edisylate], Haldol [haloperidol], Phenergan [promethazine hcl], Reglan [metoclopramide], and Tramadol   ? ?Review of Systems   ?Review of Systems ? ?Physical Exam ?Updated Vital Signs ?BP 113/78   Pulse 65   Temp (!) 97.5 ?F (36.4 ?C) (Oral)   Resp 16   Ht 5\' 10"  (1.778 m)   Wt 80.3 kg   SpO2 98%   BMI 25.40 kg/m?  ?Physical Exam ?Vitals and nursing note reviewed.  ?Constitutional:   ?   General: He is not in acute distress. ?   Appearance: He is not ill-appearing.  ?HENT:  ?   Head: Atraumatic.  ?Eyes:  ?    Conjunctiva/sclera: Conjunctivae normal.  ?Cardiovascular:  ?   Rate and Rhythm: Normal rate and regular rhythm.  ?   Pulses: Normal pulses.  ?   Heart sounds: No murmur heard. ?Pulmonary:  ?   Effort: Pulmonary effort is normal. No respiratory distress.  ?   Breath sounds: Normal breath sounds.  ?Abdominal:  ?   General: Abdomen is flat. There is no distension.  ?   Palpations: Abdomen is soft.  ?   Tenderness: There is no abdominal tenderness.  ?Musculoskeletal:     ?   General: Normal range of motion.  ?   Cervical back: Normal range of motion.  ?   Comments: Well-healed surgical scar across the left shoulder without evidence of infection.  Limited ROM due to pain.  ?Skin: ?   General: Skin is warm and dry.  ?   Capillary Refill: Capillary refill takes less than 2 seconds.  ?Neurological:  ?   General: No focal deficit present.  ?   Mental Status: He is alert.  ?Psychiatric:     ?   Mood and Affect: Mood normal.  ? ? ?ED Results / Procedures / Treatments   ?Labs ?(all labs ordered are listed, but only abnormal results are displayed) ?Labs Reviewed - No data to display ? ?EKG ?None ? ?Radiology ?No results found. ? ?Procedures ?Procedures  ? ? ?Medications Ordered in ED ?Medications  ?ketorolac (TORADOL) 30 MG/ML injection 30 mg (30 mg Intravenous Not Given 05/28/21 1920)  ?lidocaine (LIDODERM) 5 % 1 patch (1 patch Transdermal Not Given 05/28/21 1920)  ? ? ?ED Course/ Medical Decision Making/ A&P ?  ?                        ?Medical Decision Making ?Risk ?Prescription drug management. ? ? ?History:  ?Per HPI ?Social determinants of health: on suboxone ? ?Initial impression: ? ?This patient presents to the ED for concern of chronic post postoperative shoulder pain pain, this involves an extensive number of treatment options, and is a complaint that carries with it a high risk of complications and morbidity.    ? ? ? ? ?ED Course: ?41 year old male presents to the ED for evaluation of chest pain complications.  Patient  was upset, screaming "I cannot take it anymore".  Physical exam with well-healed surgical scar without evidence of infection, although he does have some mild warmth and swelling around the left shoulder.  Patient notes that after the surgery, he had been given a couple of prescriptions for narcotics for acute pain but has not been getting any more given his history of Suboxone use and opioid dependency.  His wife mentions that they are trying to establish care with a pain management clinic but it  has been difficult.  I consulted with our pharmacist to discuss the complication of managing patient's pain along with his suboxone use and he feels that patient needs to receive narcotics from a pain management clinic. I offered patient to trial Toradol with lidocaine patch and he became upset and requested to leave, stating that those medications don't work.  Given his I do not feel that he was having an acute process or cause of pain, patient can discharge to follow-up with Ortho.  I strongly encourage patient set up care with pain management. ? ?Disposition: ? ?After consideration of the diagnostic results, physical exam, history and the patients response to treatment feel that the patent would benefit from discharge.   ?Chronic left shoulder pain: Plan and management as described above ? ?Final Clinical Impression(s) / ED Diagnoses ?Final diagnoses:  ?Chronic left shoulder pain  ? ? ?Rx / DC Orders ?ED Discharge Orders   ? ? None  ? ?  ? ? ?  ?Janell Quiet, [place] ?05/28/21 1958 ? ?  ?Derwood Kaplan, MD ?05/31/21 0019 ? ?

## 2021-05-29 ENCOUNTER — Telehealth: Payer: Self-pay

## 2021-05-29 ENCOUNTER — Other Ambulatory Visit (HOSPITAL_BASED_OUTPATIENT_CLINIC_OR_DEPARTMENT_OTHER): Payer: Self-pay | Admitting: Orthopaedic Surgery

## 2021-05-29 DIAGNOSIS — S42292S Other displaced fracture of upper end of left humerus, sequela: Secondary | ICD-10-CM

## 2021-05-29 DIAGNOSIS — M25412 Effusion, left shoulder: Secondary | ICD-10-CM

## 2021-05-29 NOTE — Telephone Encounter (Signed)
Pt's mom called and wanted to know if pt can go to PT for dry needling. States pt is in a lot of pain. Also previous message requested for pt to go to [facility]  ?

## 2021-05-29 NOTE — Telephone Encounter (Signed)
I know you approved PT dry needling. I assume ok for pain mgmt referral also? Pls advise.  ? ?

## 2021-05-29 NOTE — Telephone Encounter (Signed)
Sure and that was [employer] plan as he is the primary

## 2021-05-29 NOTE — Telephone Encounter (Signed)
Patients mother contacted the office wanting to know if referral for pain management had been placed, informed patient's mother that this requested was completed this morning. No additional questions or concerns at this time.  ?

## 2021-05-29 NOTE — Telephone Encounter (Signed)
Will place referral once advised on other message from patients mother calling this morning about pain mgmt.  ?

## 2021-05-29 NOTE — Telephone Encounter (Signed)
Referral sent to [facility] group ?

## 2021-05-29 NOTE — Telephone Encounter (Signed)
Pt's mom called and stated she would like a referral for pt sent to [facility] pain management. Please cb to discuss  ?

## 2021-05-30 ENCOUNTER — Telehealth: Payer: Self-pay

## 2021-05-30 NOTE — Addendum Note (Signed)
Addended byPrescott Parma on: 05/30/2021 08:45 AM ? ? Modules accepted: Orders ? ?

## 2021-05-30 NOTE — Telephone Encounter (Signed)
Tried calling patient. No answer. Wanted to advise order has been submitted for PT to receive dry needling.  ?

## 2021-05-30 NOTE — Telephone Encounter (Signed)
Patient's mother called stating that physical therapy and pain management is going to take to long to see her son. He is in to much pain to have to continue to wait. Mother is scared that son is going to harm himself in the meantime due to increased pain. She states that he is in a safe environment at this time while he is with her but would like to know what he is able to do for pain.  ? ?[phone] (home)   ?

## 2021-05-30 NOTE — Telephone Encounter (Signed)
Dr. August Saucer can you please advise on any recommendations for pain for patient. He is currently being treated for fracture of his left shoulder. Dry needling therapy and pain management referral have already been placed for patient, however since he is having continued pain his mother would like to know how he should or could manage his pain.  ?

## 2021-05-30 NOTE — Telephone Encounter (Signed)
So just advise the patient and patient's mother to go to the emergency room if the patient is having continued pain and is feeling like he is wanting to harm himself? ?

## 2021-05-30 NOTE — Telephone Encounter (Signed)
Hi Kayla.  I was consulted on this case I have initiated a work-up to make sure his deltoid is working and infection is not present.  However if he needs continued pain medicine following his surgery 2 to 3 months ago he should talk to Dr. Steward Drone for that.

## 2021-05-30 NOTE — Telephone Encounter (Signed)
Sounds good! ? ?As far as fracture any recommendations? Advised them that referral for therapy and pain management has already been placed.  ?

## 2021-05-31 NOTE — Telephone Encounter (Signed)
Sounds Great ! ? ?If we get any additional messages about pain request I will just inform patient of previous understanding of no medication being filled.  ?

## 2021-06-01 NOTE — Progress Notes (Signed)
Faxed order to interventional radiology ?

## 2021-06-01 NOTE — Telephone Encounter (Signed)
Per message thread with history of [name] and Dr. Sammuel Hines he will be managing the patient's pain medicine as he has been doing.

## 2021-06-01 NOTE — Progress Notes (Signed)
Has he been set up for aspiration of the joint?

## 2021-06-05 ENCOUNTER — Telehealth: Payer: Self-pay

## 2021-06-05 ENCOUNTER — Ambulatory Visit: Payer: Medicaid Other | Admitting: Physical Therapy

## 2021-06-05 NOTE — Telephone Encounter (Signed)
Patient would like aspiration done for left shoulder.  Stated that pain management could not help due to his history.  Did ask for a Rx for pain? CB# [phone].  Please advise.  Thank you. ?

## 2021-06-05 NOTE — Telephone Encounter (Signed)
Okay for aspiration.  Pain medicine per b[place].  Thanks

## 2021-06-05 NOTE — Telephone Encounter (Signed)
Contacted patient and informed him that per his and Dr. Joanie Coddington previous conversations we would not be prescribing any pain medication to him. Patient states that we are forcing him to go buy illegal drugs. Explained to the patient that is not what our office is doing and have given him the options of OTC medications. Patient has already been scheduled and seen by pain management. He has been scheduled for shoulder evaluation with Dr. August Saucer on 06/07/2021. ?

## 2021-06-07 ENCOUNTER — Ambulatory Visit (INDEPENDENT_AMBULATORY_CARE_PROVIDER_SITE_OTHER): Payer: Medicaid Other | Admitting: Orthopedic Surgery

## 2021-06-07 DIAGNOSIS — M79602 Pain in left arm: Secondary | ICD-10-CM | POA: Diagnosis not present

## 2021-06-07 DIAGNOSIS — M25412 Effusion, left shoulder: Secondary | ICD-10-CM

## 2021-06-07 NOTE — Telephone Encounter (Signed)
Per phone conversation on 06/05/2021 I explained to the patient that we would not be prescribing any pain medication. Referrals from pain management have been placed and since patient is on saboxone they would not prescribed to him.  ?

## 2021-06-08 ENCOUNTER — Encounter: Payer: Self-pay | Admitting: Orthopedic Surgery

## 2021-06-08 NOTE — Progress Notes (Signed)
? ?Office Visit Note ?  ?Patient: [name]           ?Date of Birth: March 23, 1980           ?MRN: [id number] ?Visit Date: 06/07/2021 ?Requested by: [place]. ?[address] ?[place],  [place] [id number] ?PCP: [place]. ? ?Subjective: ?Chief Complaint  ?Patient presents with  ? Left Shoulder - Pain  ? ? ?HPI: Mcdaniel is a 41 year old patient with left shoulder pain.  Had closed four-part fracture treated with open reduction internal fixation.  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  Hardware removed in early January.  Significant pain and dysfunction since that time.  Labs drawn show low vitamin D and elevated infection parameters.  Deltoid function also somewhat in question.  He is not having any fevers or chills he is a heavy smoker. ?             ?ROS: All systems reviewed are negative as they relate to the chief complaint within the history of present illness.  Patient denies  fevers or chills. ? ? ?Assessment & Plan: ?Visit Diagnoses:  ?1. Effusion of joint of left shoulder   ? ? ?Plan: Impression is left shoulder pain.  Exam today I am slightly more convinced that the deltoid may be functional but it still hard to say if for certain.  He needs nerve conduction study to evaluate deltoid and axillary nerve function.  Hospital guided ultrasound aspiration of that left shoulder joint to rule out infection.  Supplement vitamin D.  Follow-up after that nerve study and after the ultrasound-guided shoulder aspiration. ? ?Follow-Up Instructions: Return in about 4 weeks (around 07/05/2021).  ? ?Orders:  ?No orders of the defined types were placed in this encounter. ? ?No orders of the defined types were placed in this encounter. ? ? ? ? Procedures: ?No procedures performed ? ? ?Clinical Data: ?No additional findings. ? ?Objective: ?Vital Signs: There were no vitals taken for this visit. ? ?Physical Exam:  ? ?Constitutional: Patient appears  well-developed ?HEENT:  ?Head: Normocephalic ?Eyes:EOM are normal ?Neck: Normal range of motion ?Cardiovascular: Normal rate ?Pulmonary/chest: Effort normal ?Neurologic: Patient is alert ?Skin: Skin is warm ?Psychiatric: Patient has normal mood and affect ? ? ?Ortho Exam: Ortho exam demonstrates painful shoulder range of motion passively and actively.  I am slightly more convinced the deltoid may be functioning today but its difficult to say for certain.  Motor or sensory function to the hand is intact.  No lymphadenopathy in the proximal left shoulder girdle region..  Regarding pain medicine and he is going to continue to work with his primary surgeon on pain management. ? ?Specialty Comments:  ?No specialty comments available. ? ?Imaging: ?No results found. ? ? ?PMFS History: ?Patient Active Problem List  ? Diagnosis Date Noted  ? Closed 4-part fracture of proximal end of left humerus   ? Painful orthopaedic hardware [facility])   ? Arthrofibrosis of left shoulder   ? Closed 4-part fracture of proximal humerus, left, initial encounter 12/07/2020  ? Rib fractures 12/04/2020  ? Cellulitis of multiple sites of upper arm 10/2018  ? Opioid abuse with opioid-induced mood disorder ([place]) 10/06/2017  ? Depression   ? PTSD (post-traumatic stress disorder)   ? Tachycardia   ? Acute blood loss anemia   ? Weakness 09/12/2015  ? Respiratory failure, acute ([place])   ? Respiratory failure ([place])   ? Atelectasis   ? Altered  mental status 09/03/2015  ? Opioid overdose ([place]) 08/29/2015  ? Leukocytosis 08/29/2015  ? Anemia 08/29/2015  ? Polysubstance abuse ([place]) 08/29/2015  ? Tobacco abuse 08/29/2015  ? ?Past Medical History:  ?Diagnosis Date  ? ADHD (attention deficit hyperactivity disorder)   ? ADHD  ? Anemia   ? Anxiety   ? Benzodiazepine abuse ([place])   ? Chronic tachycardia   ? Per Dr. Wallene Huh (psych)  ? Complication of anesthesia   ? was hard to put him under anesthesia  ? Depression   ? clinical depression  ? Drug use   ? GERD  (gastroesophageal reflux disease)   ? Headache   ? History of IBS   ? Mood disorder ([place])   ? Pneumonia   ? 2017, lung collapse  ? Polysubstance abuse ([place])   ? PTSD (post-traumatic stress disorder)   ?  ?History reviewed. No pertinent family history.  ?Past Surgical History:  ?Procedure Laterality Date  ? ADENOIDECTOMY    ? HARDWARE REMOVAL Left 03/02/2021  ? Procedure: Left Shoulder HARDWARE REMOVAL;  Surgeon: Vanetta Mulders, MD;  Location: [facility];  Service: Orthopedics;  Laterality: Left;  ? INNER EAR SURGERY    ? MULTIPLE TOOTH EXTRACTIONS    ? ORIF HUMERUS FRACTURE Left 12/12/2020  ? Procedure: OPEN REDUCTION INTERNAL FIXATION (ORIF) LEFT HUMERUS FRACTURE;  Surgeon: Vanetta Mulders, MD;  Location: [facility];  Service: Orthopedics;  Laterality: Left;  estimated operating time 2 hours.  ? WISDOM TOOTH EXTRACTION    ? ?Social History  ? ?Occupational History  ? Occupation: disabled  ?Tobacco Use  ? Smoking status: Every Day  ?  Packs/day: 0.50  ?  Types: Cigarettes  ? Smokeless tobacco: Never  ?Vaping Use  ? Vaping Use: Former  ?Substance and Sexual Activity  ? Alcohol use: Never  ? Drug use: Yes  ?  Types: IV, Cocaine, Marijuana, Amphetamines, Hydrocodone, Oxycodone  ?  Comment: no longer uses cocaine or marijuana  ? Sexual activity: Yes  ?  Birth control/protection: Condom  ? ? ? ? ? ?

## 2021-06-08 NOTE — Addendum Note (Signed)
Addended byPrescott Parma on: 06/08/2021 10:16 AM ? ? Modules accepted: Orders ? ?

## 2021-06-12 ENCOUNTER — Ambulatory Visit (HOSPITAL_COMMUNITY)
Admission: RE | Admit: 2021-06-12 | Discharge: 2021-06-12 | Disposition: A | Discharge status: Home / Self Care | Payer: Medicaid Other | Source: Ambulatory Visit | Attending: Orthopedic Surgery | Admitting: Orthopedic Surgery

## 2021-06-12 DIAGNOSIS — M25512 Pain in left shoulder: Secondary | ICD-10-CM | POA: Insufficient documentation

## 2021-06-12 DIAGNOSIS — X58XXXS Exposure to other specified factors, sequela: Secondary | ICD-10-CM | POA: Insufficient documentation

## 2021-06-12 DIAGNOSIS — S4292XS Fracture of left shoulder girdle, part unspecified, sequela: Secondary | ICD-10-CM | POA: Diagnosis not present

## 2021-06-12 DIAGNOSIS — M25412 Effusion, left shoulder: Secondary | ICD-10-CM | POA: Insufficient documentation

## 2021-06-12 LAB — SYNOVIAL CELL COUNT + DIFF, W/ CRYSTALS
Crystals, Fluid: NONE SEEN
Eosinophils-Synovial: 2 % — ABNORMAL HIGH (ref 0–1)
Lymphocytes-Synovial Fld: 43 % — ABNORMAL HIGH (ref 0–20)
Monocyte-Macrophage-Synovial Fluid: 11 % — ABNORMAL LOW (ref 50–90)
Neutrophil, Synovial: 43 % — ABNORMAL HIGH (ref 0–25)
Other Cells-SYN: 1

## 2021-06-12 MED ORDER — LIDOCAINE HCL (PF) 1 % IJ SOLN
5.0000 mL | Freq: Once | INTRAMUSCULAR | Status: DC
Start: 2021-06-12 — End: 2021-06-13

## 2021-06-12 NOTE — Therapy (Deleted)
OUTPATIENT PHYSICAL THERAPY SHOULDER EVALUATION   Patient Name: [name] MRN: [id number] DOB:06/03/1980, 41 y.o., male Today's Date: 06/12/2021    Past Medical History:  Diagnosis Date   ADHD (attention deficit hyperactivity disorder)    ADHD   Anemia    Anxiety    Benzodiazepine abuse ([facility])    Chronic tachycardia    Per Dr. Doreene ElandHendrix (psych)   Complication of anesthesia    was hard to put him under anesthesia   Depression    clinical depression   Drug use    GERD (gastroesophageal reflux disease)    Headache    History of IBS    Mood disorder ([facility])    Pneumonia    2017, lung collapse   Polysubstance abuse ([facility])    PTSD (post-traumatic stress disorder)    Past Surgical History:  Procedure Laterality Date   ADENOIDECTOMY     HARDWARE REMOVAL Left 03/02/2021   Procedure: Left Shoulder HARDWARE REMOVAL;  Surgeon: Huel CoteBokshan, Steven, MD;  Location: [facility];  Service: Orthopedics;  Laterality: Left;   INNER EAR SURGERY     MULTIPLE TOOTH EXTRACTIONS     ORIF HUMERUS FRACTURE Left 12/12/2020   Procedure: OPEN REDUCTION INTERNAL FIXATION (ORIF) LEFT HUMERUS FRACTURE;  Surgeon: Huel CoteBokshan, Steven, MD;  Location: [facility];  Service: Orthopedics;  Laterality: Left;  estimated operating time 2 hours.   WISDOM TOOTH EXTRACTION     Patient Active Problem List   Diagnosis Date Noted   Closed 4-part fracture of proximal end of left humerus    Painful orthopaedic hardware [facility](HCC)    Arthrofibrosis of left shoulder    Closed 4-part fracture of proximal humerus, left, initial encounter 12/07/2020   Rib fractures 12/04/2020   Cellulitis of multiple sites of upper arm 10/2018   Opioid abuse with opioid-induced mood disorder ([facility]) 10/06/2017   Depression    PTSD (post-traumatic stress disorder)    Tachycardia    Acute blood loss anemia    Weakness 09/12/2015   Respiratory failure, acute ([facility])    Respiratory failure ([facility])    Atelectasis    Altered mental status  09/03/2015   Opioid overdose (H[facility]) 08/29/2015   Leukocytosis 08/29/2015   Anemia 08/29/2015   Polysubstance abuse ([facility]) 08/29/2015   Tobacco abuse 08/29/2015    PCP: [facility]  REFERRING PROVIDER: Cammy Copaean, Gregory Scott, MD  REFERRING DIAG:  S42.292S (ICD-10-CM) - Closed 4-part fracture of proximal end of left humerus, sequela  M25.412 (ICD-10-CM) - Effusion of joint of left shoulder    THERAPY DIAG:  No diagnosis found.   ONSET DATE: 05-28-21  SUBJECTIVE:  SUBJECTIVE STATEMENT: ***  PERTINENT HISTORY: Left proximal humerus fx with ORIF 12-12-21, Left shld hardware removal 03-02-21 Hx of opioid dependency/on Suboxone Developed AVN   PAIN:  Are you having pain? {OPRCPAIN:27236}  PRECAUTIONS: {Therapy precautions:24002}  WEIGHT BEARING RESTRICTIONS {Yes ***/No:24003}  FALLS:  Has patient fallen in last 6 months? {fallsyesno:27318}  LIVING ENVIRONMENT: Lives with: {OPRC lives with:25569::"lives with their family"} Lives in: {Lives in:25570} Stairs: {opstairs:27293} Has following equipment at home: {Assistive devices:23999}  OCCUPATION: ***  PLOF: {PLOF:24004}  PATIENT GOALS ***  OBJECTIVE:   DIAGNOSTIC FINDINGS:  ***  PATIENT SURVEYS:  {rehab surveys:24030:a}  COGNITION:  Overall cognitive status: {cognition:24006}     SENSATION: {sensation:27233}  POSTURE: ***  UPPER EXTREMITY ROM:   {AROM/PROM:27142} ROM Right 06/12/2021 Left 06/12/2021  Shoulder flexion    Shoulder extension    Shoulder abduction    Shoulder adduction    Shoulder internal rotation    Shoulder external rotation    Elbow flexion    Elbow extension    Wrist flexion    Wrist extension    Wrist ulnar deviation    Wrist radial deviation    Wrist pronation    Wrist supination     (Blank rows = not tested)  UPPER EXTREMITY MMT:  MMT Right 06/12/2021 Left 06/12/2021  Shoulder flexion    Shoulder extension    Shoulder abduction    Shoulder adduction    Shoulder internal rotation    Shoulder external rotation    Middle trapezius    Lower trapezius    Elbow flexion    Elbow extension    Wrist flexion    Wrist extension    Wrist ulnar deviation    Wrist radial deviation    Wrist pronation    Wrist supination    Grip strength (lbs)    (Blank rows = not tested)  SHOULDER SPECIAL TESTS:  Impingement tests: {shoulder impingement test:25231:a}  SLAP lesions: {SLAP lesions:25232}  Instability tests: {shoulder instability test:25233}  Rotator cuff assessment: {rotator cuff assessment:25234}  Biceps assessment: {biceps assessment:25235}  JOINT MOBILITY TESTING:  ***  PALPATION:  ***   TODAY'S TREATMENT:  ***   PATIENT EDUCATION: Education details: *** Person educated: {Person educated:25204} Education method: {Education Method:25205} Education comprehension: {Education Comprehension:25206}   HOME EXERCISE PROGRAM: ***  ASSESSMENT:  CLINICAL IMPRESSION: Patient is a *** y.o. *** who was seen today for physical therapy evaluation and treatment for ***.    OBJECTIVE IMPAIRMENTS {opptimpairments:25111}.   ACTIVITY LIMITATIONS {activity limitations:25113}.   PERSONAL FACTORS {Personal factors:25162} are also affecting patient's functional outcome.    REHAB POTENTIAL: {rehabpotential:25112}  CLINICAL DECISION MAKING: {clinical decision making:25114}  EVALUATION COMPLEXITY: {Evaluation complexity:25115}   GOALS: Goals reviewed with patient? {yes/no:20286}  SHORT TERM GOALS: Target date: {follow up:25551}  *** Baseline: Goal status: {GOALSTATUS:25110}  2.  *** Baseline:  Goal status: {GOALSTATUS:25110}  3.  *** Baseline:  Goal status: {GOALSTATUS:25110}  4.  *** Baseline:  Goal status: {GOALSTATUS:25110}  5.   *** Baseline:  Goal status: {GOALSTATUS:25110}  6.  *** Baseline:  Goal status: {GOALSTATUS:25110}  LONG TERM GOALS: Target date: {follow up:25551}  *** Baseline:  Goal status: {GOALSTATUS:25110}  2.  *** Baseline:  Goal status: {GOALSTATUS:25110}  3.  *** Baseline:  Goal status: {GOALSTATUS:25110}  4.  *** Baseline:  Goal status: {GOALSTATUS:25110}  5.  *** Baseline:  Goal status: {GOALSTATUS:25110}  6.  *** Baseline:  Goal status: {GOALSTATUS:25110}   PLAN: PT FREQUENCY: {rehab frequency:25116}  PT DURATION: {rehab duration:25117}  PLANNED INTERVENTIONS: {  rehab planned interventions:25118::"Therapeutic exercises","Therapeutic activity","Neuromuscular re-education","Balance training","Gait training","Patient/Family education","Joint mobilization"}  PLAN FOR NEXT SESSION: Jenelle Mages, PT 06/12/2021, 3:42 PM

## 2021-06-13 ENCOUNTER — Encounter (HOSPITAL_BASED_OUTPATIENT_CLINIC_OR_DEPARTMENT_OTHER): Payer: Self-pay | Admitting: Orthopaedic Surgery

## 2021-06-13 ENCOUNTER — Encounter: Payer: Self-pay | Admitting: Orthopedic Surgery

## 2021-06-13 ENCOUNTER — Ambulatory Visit: Payer: Medicaid Other | Admitting: Physical Therapy

## 2021-06-17 LAB — AEROBIC/ANAEROBIC CULTURE W GRAM STAIN (SURGICAL/DEEP WOUND)
Culture: NO GROWTH
Gram Stain: NONE SEEN

## 2021-06-30 ENCOUNTER — Ambulatory Visit (INDEPENDENT_AMBULATORY_CARE_PROVIDER_SITE_OTHER): Payer: Medicaid Other | Admitting: Physical Medicine and Rehabilitation

## 2021-06-30 DIAGNOSIS — R202 Paresthesia of skin: Secondary | ICD-10-CM | POA: Diagnosis not present

## 2021-06-30 DIAGNOSIS — R531 Weakness: Secondary | ICD-10-CM

## 2021-06-30 NOTE — Progress Notes (Signed)
Left UE pain "Eval for axillary nerve function"

## 2021-07-03 NOTE — Progress Notes (Signed)
[name] - 41 y.o. male MRN [id number]  Date of birth: 29-Sep-1980  Office Visit Note: Visit Date: 06/30/2021 PCP: [facility] Referred by: Cammy Copa, MD  Subjective: Chief Complaint  Patient presents with   Left Arm - Pain   HPI:  [name] is a 41 y.o. male who comes in today at the request of Dr. Burnard Bunting for electrodiagnostic study of the Left upper extremities.  Patient is Right hand dominant. Had closed four-part fracture treated with open reduction internal fixation.  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  Hardware removed in early January.  Significant pain and dysfunction since that time.  Denies any real specific numbness or tingling.  No frank radicular symptoms.  The main question is axillary nerve function.  ROS Otherwise per HPI.  Assessment & Plan: Visit Diagnoses:    ICD-10-CM   1. Paresthesia of skin  R20.2 NCV with EMG (electromyography)    2. Weakness  R53.1       Plan: No additional findings.   Meds & Orders: No orders of the defined types were placed in this encounter.   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  This is virtually asymptomatic.   There is no significant electrodiagnostic evidence of any other focal nerve entrapment(specifically axillary nerve is intact and functioning normal), brachial plexopathy or cervical radiculopathy.   Recommendations: 1.  Follow-up with referring physician. 2.  Continue current management of symptoms.  Procedures: No procedures performed   EMG & NCV Findings: Evaluation of the left median (across palm) sensory nerve showed prolonged distal peak latency (Wrist, 4.0 ms) and prolonged distal  peak latency (Palm, 2.3 ms).  The left ulnar sensory nerve showed prolonged distal peak latency (3.8 ms) and decreased conduction velocity (Wrist-5th Digit, 37 m/s).  All remaining nerves (as indicated in the following tables) were within normal limits.    All examined muscles (as indicated in the following table) showed no evidence of electrical instability.    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  This is virtually asymptomatic.   There is no significant electrodiagnostic evidence of any other focal nerve entrapment(specifically axillary nerve is intact and functioning normal), brachial plexopathy or cervical radiculopathy.   Recommendations: 1.  Follow-up with referring physician. 2.  Continue current management of symptoms.  ___________________________ [name] FAAPMR Board Certified, American Board of Physical Medicine and Rehabilitation    Nerve Conduction Studies Anti Sensory Summary Table   Stim Site NR Peak (ms) Norm Peak (ms) P-T Amp (V) Norm P-T Amp Site1 Site2 Delta-P (ms) Dist (cm) Vel (m/s) Norm Vel (m/s)  Left Median Acr Palm Anti Sensory (2nd Digit)  28.8C  Wrist    *4.0 <3.6 32.7 >10 Wrist Palm 1.7 0.0    Palm    *2.3 <2.0 62.9         Left Radial Anti Sensory (Base 1st Digit)  28.1C  Wrist    2.5 <3.1 38.1  Wrist Base 1st Digit 2.5 0.0    Left Ulnar Anti Sensory (5th Digit)  28.4C  Wrist    *3.8 <3.7 30.1 >15.0 Wrist 5th Digit  3.8 14.0 *37 >38   Motor Summary Table   Stim Site NR Onset (ms) Norm Onset (ms) O-P Amp (mV) Norm O-P Amp Site1 Site2 Delta-0 (ms) Dist (cm) Vel (m/s) Norm Vel (m/s)  Left Axillary Motor (Deltoid)  27.6C  Clavicle    3.5 <5 3.0         Left Median Motor (Abd Poll Brev)  28.1C  Wrist    3.9 <4.2 12.7 >5 Elbow Wrist 4.0 21.5 54 >50  Elbow    7.9  12.7         Left Ulnar Motor (Abd  Dig Min)  28.1C  Wrist    3.5 <4.2 9.4 >3 B Elbow Wrist 4.0 21.5 54 >53  B Elbow    7.5  8.1  A Elbow B Elbow 1.3 10.0 77 >53  A Elbow    8.8  7.8          EMG   Side Muscle Nerve Root Ins Act Fibs Psw Amp Dur Poly Recrt Int Dennie BiblePat Comment  Left 1stDorInt Ulnar C8-T1 Nml Nml Nml Nml Nml 0 Nml Nml   Left Abd Poll Brev Median C8-T1 Nml Nml Nml Nml Nml 0 Nml Nml   Left ExtDigCom   Nml Nml Nml Nml Nml 0 Nml Nml   Left Triceps Radial C6-7-8 Nml Nml Nml Nml Nml 0 Nml Nml   Left Deltoid Axillary C5-6 Nml Nml Nml Nml Nml 0 Nml Nml     Nerve Conduction Studies Anti Sensory Left/Right Comparison   Stim Site L Lat (ms) R Lat (ms) L-R Lat (ms) L Amp (V) R Amp (V) L-R Amp (%) Site1 Site2 L Vel (m/s) R Vel (m/s) L-R Vel (m/s)  Median Acr Palm Anti Sensory (2nd Digit)  28.8C  Wrist *4.0   32.7   Wrist Palm     Palm *2.3   62.9         Radial Anti Sensory (Base 1st Digit)  28.1C  Wrist 2.5   38.1   Wrist Base 1st Digit     Ulnar Anti Sensory (5th Digit)  28.4C  Wrist *3.8   30.1   Wrist 5th Digit *37     Motor Left/Right Comparison   Stim Site L Lat (ms) R Lat (ms) L-R Lat (ms) L Amp (mV) R Amp (mV) L-R Amp (%) Site1 Site2 L Vel (m/s) R Vel (m/s) L-R Vel (m/s)  Axillary Motor (Deltoid)  27.6C  Clavicle 3.5   3.0         Median Motor (Abd Poll Brev)  28.1C  Wrist 3.9   12.7   Elbow Wrist 54    Elbow 7.9   12.7         Ulnar Motor (Abd Dig Min)  28.1C  Wrist 3.5   9.4   B Elbow Wrist 54    B Elbow 7.5   8.1   A Elbow B Elbow 77    A Elbow 8.8   7.8            Waveforms:              Clinical History: No specialty comments available.     Objective:  VS:  HT:    WT:   BMI:     BP:   HR: bpm  TEMP: ( )  RESP:  Physical Exam Musculoskeletal:        General: No tenderness.     Comments: Inspection reveals no atrophy of the bilateral APB or  FDI or hand intrinsics.  There is a well-healed surgical scar of the left shoulder with decreased range of motion and pain.  There  is no swelling, color changes, allodynia or dystrophic changes. There is 5 out of 5 strength in the bilateral wrist extension, finger abduction and long finger flexion. There is intact sensation to light touch in all dermatomal and peripheral nerve distributions.   Skin:    General: Skin is warm and dry.     Findings: No erythema or rash.  Neurological:     General: No focal deficit present.     Mental Status: He is alert and oriented to person, place, and time.     Sensory: No sensory deficit.     Motor: No weakness or abnormal muscle tone.     Coordination: Coordination normal.     Gait: Gait normal.  Psychiatric:        Mood and Affect: Mood normal.        Behavior: Behavior normal.        Thought Content: Thought content normal.     Imaging: No results found.

## 2021-07-03 NOTE — Procedures (Signed)
EMG & NCV Findings: Evaluation of the left median (across palm) sensory nerve showed prolonged distal peak latency (Wrist, 4.0 ms) and prolonged distal peak latency (Palm, 2.3 ms).  The left ulnar sensory nerve showed prolonged distal peak latency (3.8 ms) and decreased conduction velocity (Wrist-5th Digit, 37 m/s).  All remaining nerves (as indicated in the following tables) were within normal limits.    All examined muscles (as indicated in the following table) showed no evidence of electrical instability.    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  This is virtually asymptomatic.   There is no significant electrodiagnostic evidence of any other focal nerve entrapment(specifically axillary nerve is intact and functioning normal), brachial plexopathy or cervical radiculopathy.   Recommendations: 1.  Follow-up with referring physician. 2.  Continue current management of symptoms.  ___________________________ Laurence Spates FAAPMR Board Certified, American Board of Physical Medicine and Rehabilitation    Nerve Conduction Studies Anti Sensory Summary Table   Stim Site NR Peak (ms) Norm Peak (ms) P-T Amp (V) Norm P-T Amp Site1 Site2 Delta-P (ms) Dist (cm) Vel (m/s) Norm Vel (m/s)  Left Median Acr Palm Anti Sensory (2nd Digit)  28.8C  Wrist    *4.0 <3.6 32.7 >10 Wrist Palm 1.7 0.0    Palm    *2.3 <2.0 62.9         Left Radial Anti Sensory (Base 1st Digit)  28.1C  Wrist    2.5 <3.1 38.1  Wrist Base 1st Digit 2.5 0.0    Left Ulnar Anti Sensory (5th Digit)  28.4C  Wrist    *3.8 <3.7 30.1 >15.0 Wrist 5th Digit 3.8 14.0 *37 >38   Motor Summary Table   Stim Site NR Onset (ms) Norm Onset (ms) O-P Amp (mV) Norm O-P Amp Site1 Site2 Delta-0 (ms) Dist (cm) Vel (m/s) Norm Vel (m/s)  Left Axillary Motor (Deltoid)  27.6C  Clavicle    3.5 <5 3.0          Left Median Motor (Abd Poll Brev)  28.1C  Wrist    3.9 <4.2 12.7 >5 Elbow Wrist 4.0 21.5 54 >50  Elbow    7.9  12.7         Left Ulnar Motor (Abd Dig Min)  28.1C  Wrist    3.5 <4.2 9.4 >3 B Elbow Wrist 4.0 21.5 54 >53  B Elbow    7.5  8.1  A Elbow B Elbow 1.3 10.0 77 >53  A Elbow    8.8  7.8          EMG   Side Muscle Nerve Root Ins Act Fibs Psw Amp Dur Poly Recrt Int Fraser Din Comment  Left 1stDorInt Ulnar C8-T1 Nml Nml Nml Nml Nml 0 Nml Nml   Left Abd Poll Brev Median C8-T1 Nml Nml Nml Nml Nml 0 Nml Nml   Left ExtDigCom   Nml Nml Nml Nml Nml 0 Nml Nml   Left Triceps Radial C6-7-8 Nml Nml Nml Nml Nml 0 Nml Nml   Left Deltoid Axillary C5-6 Nml Nml Nml Nml Nml 0 Nml Nml     Nerve Conduction Studies Anti Sensory Left/Right Comparison   Stim Site L Lat (ms) R Lat (ms) L-R Lat (ms) L Amp (V) R Amp (V) L-R Amp (%) Site1 Site2 L Vel (m/s) R Vel (m/s) L-R Vel (m/s)  Median Acr Palm Anti Sensory (2nd Digit)  28.[place]  Wrist *4.0   32.7   Wrist Palm     Palm *2.3   62.9         Radial Anti Sensory (Base 1st Digit)  28.1C  Wrist 2.5   38.1   Wrist Base 1st Digit     Ulnar Anti Sensory (5th Digit)  28.4C  Wrist *3.8   30.1   Wrist 5th Digit *37     Motor Left/Right Comparison   Stim Site L Lat (ms) R Lat (ms) L-R Lat (ms) L Amp (mV) R Amp (mV) L-R Amp (%) Site1 Site2 L Vel (m/s) R Vel (m/s) L-R Vel (m/s)  Axillary Motor (Deltoid)  27.6C  Clavicle 3.5   3.0         Median Motor (Abd Poll Brev)  28.1C  Wrist 3.9   12.7   Elbow Wrist 54    Elbow 7.9   12.7         Ulnar Motor (Abd Dig Min)  28.1C  Wrist 3.5   9.4   B Elbow Wrist 54    B Elbow 7.5   8.1   A Elbow B Elbow 77    A Elbow 8.8   7.8            Waveforms:

## 2021-07-11 ENCOUNTER — Encounter (HOSPITAL_BASED_OUTPATIENT_CLINIC_OR_DEPARTMENT_OTHER): Payer: Self-pay | Admitting: Orthopaedic Surgery

## 2021-07-19 ENCOUNTER — Ambulatory Visit (INDEPENDENT_AMBULATORY_CARE_PROVIDER_SITE_OTHER): Payer: Medicaid Other | Admitting: Orthopedic Surgery

## 2021-07-19 ENCOUNTER — Encounter: Payer: Self-pay | Admitting: Orthopedic Surgery

## 2021-07-19 DIAGNOSIS — M79602 Pain in left arm: Secondary | ICD-10-CM

## 2021-07-19 NOTE — Progress Notes (Signed)
Office Visit Note   Patient: [name]           Date of Birth: Jun 30, 1980           MRN: [id number] Visit Date: 07/19/2021 Requested by: [place],  [place] [address] PCP: [place].  Subjective: Chief Complaint  Patient presents with   Left Shoulder - Follow-up    HPI: [name] is a 41 year old patient.  To review left shoulder EMG nerve study.  He also had labs 2 months ago which did show elevation in sed rate and C-reactive protein.  Aspiration of the joint was negative.  Continued pain.  EMG nerve study does show axillary nerve function intact.              ROS: All systems reviewed are negative as they relate to the chief complaint within the history of present illness.  Patient denies  fevers or chills.   Assessment & Plan: Visit Diagnoses:  1. Left arm pain     Plan: Impression is left arm pain.  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  Axillary nerve is functional which does open up the possibility of reverse shoulder replacement for him.  However his lab values remain elevated and the shoulder remains warm.  Could be postop but we do need 2 pointst o Evaluate the trend before making any decisions about shoulder replacement.  Vitamin D was also very low 2 months ago.  Plan today draw sed rate C-reactive protein CBC DIF as well as vitamin D panel.  Follow-up in 6 weeks and we can consider making a plan with Dr. Roger Shelter  input about future management Follow-Up Instructions: Return in about 6 weeks (around 08/30/2021).   Orders:  Orders Placed This Encounter  Procedures   Sed Rate (ESR)   C-reactive protein   CBC with Differential   Vitamin D (25 hydroxy)   No orders of the defined types were placed in this encounter.     Procedures: No procedures performed   Clinical Data: No additional findings.  Objective: Vital Signs: There were no vitals taken for this visit.  Physical Exam:    Constitutional: Patient appears well-developed HEENT:  Head: Normocephalic Eyes:EOM are normal Neck: Normal range of motion Cardiovascular: Normal rate Pulmonary/chest: Effort normal Neurologic: Patient is alert Skin: Skin is warm Psychiatric: Patient has normal mood and affect  Ortho Exam: Ortho exam demonstrates anterior subluxation of the humerus relative to the glenoid.  No drainage from the incision but his shoulder is slightly warm.  No lymphadenopathy around the shoulder region.  Motor or sensory function to the hand is intact.  Patient has started smoking again.  He is in pain management and they are managing his pain medicine requirements.   Specialty Comments:  No specialty comments available.  Imaging: No results found.   PMFS History: Patient Active Problem List   Diagnosis Date Noted   Closed 4-part fracture of proximal end of left humerus    Painful orthopaedic hardware [facility])    Arthrofibrosis of left shoulder    Closed 4-part fracture of proximal humerus, left, initial encounter 12/07/2020   Rib fractures 12/04/2020   Cellulitis of multiple sites of upper arm 10/2018   Opioid abuse with opioid-induced mood disorder ([place]) 10/06/2017   Depression    PTSD (post-traumatic stress disorder)    Tachycardia    Acute blood loss anemia    Weakness 09/12/2015   Respiratory failure, acute ([place])  Respiratory failure ([facility])    Atelectasis    Altered mental status 09/03/2015   Opioid overdose ([facility]CC) 08/29/2015   Leukocytosis 08/29/2015   Anemia 08/29/2015   Polysubstance abuse (HCC) 08/29/2015   Tobacco abuse 08/29/2015   Past Medical History:  Diagnosis Date   ADHD (attention deficit hyperactivity disorder)    ADHD   Anemia    Anxiety    Benzodiazepine abuse (HCC)    Chronic tachycardia    Per Dr. Hendrix (psych)   Complication of anesthesia    was hard to put him under anesthesia   Depression    clinical depression   Drug use    GERD (gastroesophageal  reflux disease)    Headache    History of IBS    Mood disorder (HCC)    Pneumonia    2017, lung collapse   Polysubstance abuse (HCC)    PTSD (post-traumatic stress disorder)     History reviewed. No pertinent family history.  Past Surgical History:  Procedure Laterality Date   ADENOIDECTOMY     HARDWARE REMOVAL Left 03/02/2021   Procedure: Left Shoulder HARDWARE REMOVAL;  Surgeon: Bokshan, Steven, MD;  Location: [facility];  Service: Orthopedics;  Laterality: Left;   INNER EAR SURGERY     MULTIPLE TOOTH EXTRACTIONS     ORIF HUMERUS FRACTURE Left 12/12/2020   Procedure: OPEN REDUCTION INTERNAL FIXATION (ORIF) LEFT HUMERUS FRACTURE;  Surgeon: Bokshan, Steven, MD;  Location: [facility] OR;  Service: Orthopedics;  Laterality: Left;  estimated operating time 2 hours.   WISDOM TOOTH EXTRACTION     Social History   Occupational History   Occupation: disabled  Tobacco Use   Smoking status: Every Day    Packs/day: 0.50    Types: Cigarettes   Smokeless tobacco: Never  Vaping Use   Vaping Use: Former  Substance and Sexual Activity   Alcohol use: Never   Drug use: Yes    Types: IV, Cocaine, Marijuana, Amphetamines, Hydrocodone, Oxycodone    Comment: no longer uses cocaine or marijuana   Sexual activity: Yes    Birth control/protection: Condom       

## 2021-07-20 ENCOUNTER — Other Ambulatory Visit (HOSPITAL_BASED_OUTPATIENT_CLINIC_OR_DEPARTMENT_OTHER): Payer: Self-pay | Admitting: Orthopaedic Surgery

## 2021-07-20 DIAGNOSIS — M87022 Idiopathic aseptic necrosis of left humerus: Secondary | ICD-10-CM

## 2021-07-20 LAB — CBC WITH DIFFERENTIAL/PLATELET
Absolute Monocytes: 554 cells/uL (ref 200–950)
Basophils Absolute: 50 cells/uL (ref 0–200)
Basophils Relative: 0.6 %
Eosinophils Absolute: 101 cells/uL (ref 15–500)
Eosinophils Relative: 1.2 %
HCT: 43.2 % (ref 38.5–50.0)
Hemoglobin: 14.4 g/dL (ref 13.2–17.1)
Lymphs Abs: 1604 cells/uL (ref 850–3900)
MCH: 28.7 pg (ref 27.0–33.0)
MCHC: 33.3 g/dL (ref 32.0–36.0)
MCV: 86.2 fL (ref 80.0–100.0)
MPV: 11.4 fL (ref 7.5–12.5)
Monocytes Relative: 6.6 %
Neutro Abs: 6090 cells/uL (ref 1500–7800)
Neutrophils Relative %: 72.5 %
Platelets: 311 10*3/uL (ref 140–400)
RBC: 5.01 10*6/uL (ref 4.20–5.80)
RDW: 14.1 % (ref 11.0–15.0)
Total Lymphocyte: 19.1 %
WBC: 8.4 10*3/uL (ref 3.8–10.8)

## 2021-07-20 LAB — C-REACTIVE PROTEIN: CRP: 3.3 mg/L (ref <8.0)

## 2021-07-20 LAB — SEDIMENTATION RATE: Sed Rate: 17 mm/h — ABNORMAL HIGH (ref 0–15)

## 2021-07-20 LAB — VITAMIN D 25 HYDROXY (VIT D DEFICIENCY, FRACTURES): Vit D, 25-Hydroxy: 28 ng/mL — ABNORMAL LOW (ref 30–100)

## 2021-07-21 ENCOUNTER — Telehealth: Payer: Self-pay | Admitting: Orthopaedic Surgery

## 2021-07-21 ENCOUNTER — Encounter (HOSPITAL_BASED_OUTPATIENT_CLINIC_OR_DEPARTMENT_OTHER): Payer: Self-pay

## 2021-07-21 NOTE — Telephone Encounter (Signed)
Responded via MyChart stating Dr. Sammuel Hines has sent a referral to [facility] due to the complexity of his case.

## 2021-07-21 NOTE — Telephone Encounter (Signed)
Patient called advised he is in a lot of pain and need to be seen back in the office sooner than 08/18/2021. Patient said if he has an infection he need an antibiotic as well. Patient asked for a call back as soon as possible.  The number to contact patient is [phone]

## 2021-07-23 NOTE — Progress Notes (Signed)
Needs further vitamin D supplementation 50,000 units a week for the next 8 weeks and repeat blood draws in 6 weeks to see when they normalize in terms of sed rate C-reactive protein and CBC DIF.  Also I think Dr. Sheralyn Boatman is arranging for another opinion

## 2021-07-24 MED ORDER — ERGOCALCIFEROL 1.25 MG (50000 UT) PO CAPS: 50000.0000 [IU] | ORAL_CAPSULE | ORAL | 0 refills | Status: AC

## 2021-07-24 NOTE — Progress Notes (Signed)
IC advised, meds submitted

## 2021-07-24 NOTE — Addendum Note (Signed)
Addended byPrescott Parma on: 07/24/2021 03:48 PM   Modules accepted: Orders

## 2021-08-18 ENCOUNTER — Telehealth: Payer: Self-pay | Admitting: Orthopaedic Surgery

## 2021-08-18 ENCOUNTER — Ambulatory Visit (HOSPITAL_BASED_OUTPATIENT_CLINIC_OR_DEPARTMENT_OTHER): Payer: Medicaid Other | Admitting: Orthopaedic Surgery

## 2021-08-18 NOTE — Telephone Encounter (Signed)
Attempted to call [name] to let him know of his appointment cancellations for today. No answer. Voicemail was full
